# Patient Record
Sex: Male | Born: 1950 | Race: White | Hispanic: No | Marital: Single | Smoking: Never smoker
Health system: Southern US, Community
[De-identification: ages and names within clinical notes are randomized; demographics above are authoritative.]

## PROBLEM LIST (undated history)

## (undated) DIAGNOSIS — F32A Depression, unspecified: Secondary | ICD-10-CM

## (undated) DIAGNOSIS — E119 Type 2 diabetes mellitus without complications: Secondary | ICD-10-CM

## (undated) DIAGNOSIS — I10 Essential (primary) hypertension: Secondary | ICD-10-CM

## (undated) DIAGNOSIS — K219 Gastro-esophageal reflux disease without esophagitis: Secondary | ICD-10-CM

## (undated) DIAGNOSIS — E559 Vitamin D deficiency, unspecified: Secondary | ICD-10-CM

## (undated) DIAGNOSIS — F329 Major depressive disorder, single episode, unspecified: Secondary | ICD-10-CM

## (undated) DIAGNOSIS — M1990 Unspecified osteoarthritis, unspecified site: Secondary | ICD-10-CM

## (undated) DIAGNOSIS — E785 Hyperlipidemia, unspecified: Secondary | ICD-10-CM

## (undated) DIAGNOSIS — E039 Hypothyroidism, unspecified: Secondary | ICD-10-CM

## (undated) HISTORY — DX: Type 2 diabetes mellitus without complications: E11.9

## (undated) HISTORY — PX: THYROIDECTOMY, PARTIAL: SHX18

## (undated) HISTORY — DX: Depression, unspecified: F32.A

## (undated) HISTORY — DX: Gastro-esophageal reflux disease without esophagitis: K21.9

## (undated) HISTORY — DX: Unspecified osteoarthritis, unspecified site: M19.90

## (undated) HISTORY — DX: Vitamin D deficiency, unspecified: E55.9

## (undated) HISTORY — DX: Major depressive disorder, single episode, unspecified: F32.9

## (undated) HISTORY — PX: OTHER SURGICAL HISTORY: SHX169

---

## 2008-03-19 ENCOUNTER — Ambulatory Visit: Payer: Self-pay | Admitting: Chiropractor

## 2009-08-05 ENCOUNTER — Inpatient Hospital Stay: Payer: Self-pay | Admitting: Internal Medicine

## 2009-08-18 ENCOUNTER — Ambulatory Visit: Payer: Self-pay | Admitting: Urology

## 2009-09-12 ENCOUNTER — Ambulatory Visit: Payer: Self-pay | Admitting: Internal Medicine

## 2009-09-26 ENCOUNTER — Ambulatory Visit: Payer: Self-pay | Admitting: Internal Medicine

## 2009-10-03 ENCOUNTER — Ambulatory Visit: Payer: Self-pay | Admitting: Internal Medicine

## 2010-03-28 ENCOUNTER — Emergency Department: Payer: Self-pay | Admitting: Emergency Medicine

## 2010-04-01 ENCOUNTER — Inpatient Hospital Stay: Payer: Self-pay | Admitting: Internal Medicine

## 2010-04-13 ENCOUNTER — Emergency Department: Payer: Self-pay | Admitting: Emergency Medicine

## 2010-04-16 ENCOUNTER — Inpatient Hospital Stay: Payer: Self-pay | Admitting: Internal Medicine

## 2010-05-29 ENCOUNTER — Ambulatory Visit: Payer: Self-pay | Admitting: Orthopedic Surgery

## 2010-11-10 ENCOUNTER — Inpatient Hospital Stay: Payer: Self-pay | Admitting: Internal Medicine

## 2010-12-11 ENCOUNTER — Ambulatory Visit: Payer: Self-pay | Admitting: Geriatric Medicine

## 2011-11-04 ENCOUNTER — Other Ambulatory Visit: Payer: Self-pay

## 2011-11-04 LAB — CREATININE, SERUM: EGFR (African American): >60

## 2011-11-06 ENCOUNTER — Other Ambulatory Visit: Payer: Self-pay | Admitting: Geriatric Medicine

## 2011-11-06 LAB — VANCOMYCIN, TROUGH: Vancomycin, Trough: 23 ug/mL (ref 10–20)

## 2011-11-09 ENCOUNTER — Other Ambulatory Visit: Payer: Self-pay

## 2011-11-09 LAB — VANCOMYCIN, TROUGH: Vancomycin, Trough: 20 ug/mL (ref 10–20)

## 2012-04-14 ENCOUNTER — Ambulatory Visit: Payer: Self-pay | Admitting: Gastroenterology

## 2012-04-22 LAB — PATHOLOGY REPORT

## 2012-09-13 ENCOUNTER — Other Ambulatory Visit: Payer: Self-pay

## 2014-12-16 LAB — HM DIABETES EYE EXAM

## 2014-12-16 LAB — HM DIABETES FOOT EXAM: HM DIABETIC FOOT EXAM: NEGATIVE

## 2015-03-04 ENCOUNTER — Telehealth: Payer: Self-pay | Admitting: Family Medicine

## 2015-03-04 NOTE — Telephone Encounter (Signed)
[facility] faxed a request to send patient to psychiatry. Pt has not been treated at this office for any psychological disorder. Pt is also due for routine follow-up. Have left a message with Lawson FiscalLori at front desk to speak with Crystal regarding scheduling this appt.

## 2015-03-13 ENCOUNTER — Telehealth: Payer: Self-pay | Admitting: Family Medicine

## 2015-03-13 NOTE — Telephone Encounter (Signed)
Called [facility] to inform them that patient will need a follow-up appt for his Diabetes, hypothyroid, and labwork prior to getting anymore refills.

## 2015-03-18 LAB — HM DIABETES FOOT EXAM: HM Diabetic Foot Exam: NEGATIVE

## 2015-03-28 ENCOUNTER — Telehealth: Payer: Self-pay | Admitting: Family Medicine

## 2015-03-28 ENCOUNTER — Ambulatory Visit (INDEPENDENT_AMBULATORY_CARE_PROVIDER_SITE_OTHER): Payer: Medicare Other | Admitting: Family Medicine

## 2015-03-28 ENCOUNTER — Encounter: Payer: Self-pay | Admitting: Family Medicine

## 2015-03-28 ENCOUNTER — Other Ambulatory Visit
Admission: RE | Admit: 2015-03-28 | Discharge: 2015-03-28 | Disposition: A | Discharge status: Home / Self Care | Payer: Medicare Other | Source: Ambulatory Visit | Attending: Family Medicine | Admitting: Family Medicine

## 2015-03-28 VITALS — BP 120/74 | HR 69 | Temp 98.0°F | Resp 16 | Ht 67.0 in | Wt 236.4 lb

## 2015-03-28 DIAGNOSIS — E890 Postprocedural hypothyroidism: Secondary | ICD-10-CM

## 2015-03-28 DIAGNOSIS — E559 Vitamin D deficiency, unspecified: Secondary | ICD-10-CM | POA: Diagnosis not present

## 2015-03-28 DIAGNOSIS — E1142 Type 2 diabetes mellitus with diabetic polyneuropathy: Secondary | ICD-10-CM

## 2015-03-28 DIAGNOSIS — Z79899 Other long term (current) drug therapy: Secondary | ICD-10-CM | POA: Insufficient documentation

## 2015-03-28 DIAGNOSIS — E1140 Type 2 diabetes mellitus with diabetic neuropathy, unspecified: Secondary | ICD-10-CM | POA: Insufficient documentation

## 2015-03-28 DIAGNOSIS — F32A Depression, unspecified: Secondary | ICD-10-CM

## 2015-03-28 DIAGNOSIS — N189 Chronic kidney disease, unspecified: Secondary | ICD-10-CM | POA: Insufficient documentation

## 2015-03-28 DIAGNOSIS — N182 Chronic kidney disease, stage 2 (mild): Secondary | ICD-10-CM

## 2015-03-28 DIAGNOSIS — E1122 Type 2 diabetes mellitus with diabetic chronic kidney disease: Secondary | ICD-10-CM

## 2015-03-28 DIAGNOSIS — E780 Pure hypercholesterolemia: Secondary | ICD-10-CM | POA: Insufficient documentation

## 2015-03-28 DIAGNOSIS — I1310 Hypertensive heart and chronic kidney disease without heart failure, with stage 1 through stage 4 chronic kidney disease, or unspecified chronic kidney disease: Secondary | ICD-10-CM | POA: Insufficient documentation

## 2015-03-28 DIAGNOSIS — I10 Essential (primary) hypertension: Secondary | ICD-10-CM | POA: Insufficient documentation

## 2015-03-28 DIAGNOSIS — F329 Major depressive disorder, single episode, unspecified: Secondary | ICD-10-CM

## 2015-03-28 LAB — LIPID PANEL
CHOL/HDL RATIO: 3.9 ratio
CHOLESTEROL: 163 mg/dL (ref 0–200)
HDL: 42 mg/dL (ref >40)
LDL Cholesterol: 96 mg/dL (ref 0–99)
TRIGLYCERIDES: 126 mg/dL (ref <150)
VLDL: 25 mg/dL (ref 0–40)

## 2015-03-28 LAB — COMPREHENSIVE METABOLIC PANEL
ALK PHOS: 75 U/L (ref 38–126)
ALT: 16 U/L — ABNORMAL LOW (ref 17–63)
ANION GAP: 10 (ref 5–15)
AST: 23 U/L (ref 15–41)
Albumin: 3.8 g/dL (ref 3.5–5.0)
BUN: 18 mg/dL (ref 6–20)
CO2: 29 mmol/L (ref 22–32)
Calcium: 9.1 mg/dL (ref 8.9–10.3)
Chloride: 101 mmol/L (ref 101–111)
Creatinine, Ser: 1.02 mg/dL (ref 0.61–1.24)
GLUCOSE: 107 mg/dL — AB (ref 65–99)
POTASSIUM: 3.8 mmol/L (ref 3.5–5.1)
Sodium: 140 mmol/L (ref 135–145)
TOTAL PROTEIN: 7.1 g/dL (ref 6.5–8.1)
Total Bilirubin: 0.6 mg/dL (ref 0.3–1.2)

## 2015-03-28 LAB — POCT GLYCOSYLATED HEMOGLOBIN (HGB A1C): Hemoglobin A1C: 6

## 2015-03-28 LAB — TSH: TSH: 0.069 u[IU]/mL — ABNORMAL LOW (ref 0.350–4.500)

## 2015-03-28 MED ORDER — METFORMIN HCL 500 MG PO TABS: 500.0000 mg | ORAL_TABLET | Freq: Two times a day (BID) | ORAL | Status: DC

## 2015-03-28 MED ORDER — METOPROLOL TARTRATE 25 MG PO TABS: 12.5000 mg | ORAL_TABLET | Freq: Two times a day (BID) | ORAL | Status: DC

## 2015-03-28 MED ORDER — LEVOTHYROXINE SODIUM 175 MCG PO TABS: 175.0000 ug | ORAL_TABLET | Freq: Every day | ORAL | Status: DC

## 2015-03-28 MED ORDER — LISINOPRIL 10 MG PO TABS: 15.0000 mg | ORAL_TABLET | Freq: Every day | ORAL | Status: DC

## 2015-03-28 MED ORDER — PAROXETINE HCL 30 MG PO TABS: 30.0000 mg | ORAL_TABLET | Freq: Every day | ORAL | Status: DC

## 2015-03-28 MED ORDER — FUROSEMIDE 40 MG PO TABS: 40.0000 mg | ORAL_TABLET | Freq: Every day | ORAL | Status: DC

## 2015-03-28 MED ORDER — LEVOTHYROXINE SODIUM 150 MCG PO TABS: 150.0000 ug | ORAL_TABLET | Freq: Every day | ORAL | Status: DC

## 2015-03-28 MED ORDER — CHOLECALCIFEROL 50 MCG (2000 UT) PO CAPS: 1.0000 | ORAL_CAPSULE | Freq: Every day | ORAL | Status: DC

## 2015-03-28 NOTE — Progress Notes (Signed)
Subjective:    Patient ID: Clayton Grebe., male    DOB: March 20, 1951, 64 y.o.   MRN: [id number]  HPI: Clayton Tallman. is a 64 y.o. male presenting on 03/28/2015 for Diabetes   Diabetes He presents for his follow-up diabetic visit. He has type 2 diabetes mellitus. His disease course has been stable. There are no hypoglycemic associated symptoms. Associated symptoms include foot paresthesias (occasional) and weight loss (pt is trying to lose weight through exercise). Pertinent negatives for diabetes include no blurred vision, no chest pain, no polydipsia, no polyphagia, no polyuria and no visual change. Symptoms are stable. Risk factors for coronary artery disease include dyslipidemia, hypertension and male sex. Current diabetic treatment includes oral agent (monotherapy). He is following a diabetic diet. His breakfast blood glucose range is generally 90-110 mg/dl. His overall blood glucose range is 90-110 mg/dl. An ACE inhibitor/angiotensin II receptor blocker is being taken. He sees a podiatrist.Eye exam is current.  Hypertension This is a chronic problem. The current episode started more than 1 year ago. The problem is controlled. Associated symptoms include peripheral edema (trace- takes lasixc). Pertinent negatives include no blurred vision, chest pain, palpitations or shortness of breath. Past treatments include ACE inhibitors. The current treatment provides moderate improvement. Hypertensive end-organ damage includes a thyroid problem.  Thyroid Problem Presents for follow-up visit. Symptoms include weight loss (pt is trying to lose weight through exercise). Patient reports no cold intolerance, constipation, diaphoresis, diarrhea, palpitations, visual change or weight gain. The symptoms have been stable. Prior procedures include thyroidectomy. His past medical history is significant for diabetes and neuropathy.   Depression: Pt reports he is feeling well but has occasional spell or doing  poorly. Facility had requested a psychiatric evaluation 1 month ago. Staff member accompanying patient was unable to tell why. Pt reports he sometimes feels very down and can "get himself out of it" He does endorse wanting to speak with a counselor regarding his symptoms. Denies SI/HI.   Past Medical History  Diagnosis Date  . Diabetes mellitus without complication   . Arthritis   . Vitamin D deficiency   . Depression   . GERD (gastroesophageal reflux disease)   . CKD stage 3 due to type 2 diabetes mellitus     No current outpatient prescriptions on file prior to visit.   No current facility-administered medications on file prior to visit.    Review of Systems  Constitutional: Positive for weight loss (pt is trying to lose weight through exercise). Negative for weight gain and diaphoresis.  Eyes: Negative for blurred vision.  Respiratory: Negative for shortness of breath.   Cardiovascular: Negative for chest pain and palpitations.  Gastrointestinal: Negative for diarrhea and constipation.  Endocrine: Negative for cold intolerance, polydipsia, polyphagia and polyuria.   Per HPI unless specifically indicated above     Objective:    BP 120/74 mmHg  Pulse 69  Temp(Src) 98 F (36.7 C) (Oral)  Resp 16  Ht 5\' 7"  (1.702 m)  Wt 236 lb 6.4 oz (107.23 kg)  BMI 37.02 kg/m2  Wt Readings from Last 3 Encounters:  03/28/15 236 lb 6.4 oz (107.23 kg)    Physical Exam  Constitutional: He is oriented to person, place, and time. He appears well-developed and well-nourished. No distress.  Neck: Normal range of motion. Neck supple. No thyromegaly present.  Cardiovascular: Normal rate and regular rhythm.  Exam reveals no gallop and no friction rub.   No murmur heard. Pulmonary/Chest: Effort normal and breath  sounds normal.  Abdominal: Soft. Bowel sounds are normal. There is no tenderness. There is no rebound.  Musculoskeletal: Normal range of motion. He exhibits no edema or tenderness.   Lymphadenopathy:    He has no cervical adenopathy.  Neurological: He is alert and oriented to person, place, and time.  Skin: Skin is warm and dry. He is not diaphoretic.  Skin on BLE ruddy- consistent with poor venous return.   Psychiatric: He has a normal mood and affect. His speech is normal and behavior is normal. Judgment and thought content normal. Cognition and memory are normal.   Diabetic Foot Exam - Simple   Simple Foot Form  Diabetic Foot exam was performed with the following findings:  Yes 03/28/2015  9:17 AM  Visual Inspection  No deformities, no ulcerations, no other skin breakdown bilaterally:  Yes  Sensation Testing  See comments:  Yes  Pulse Check  Posterior Tibialis and Dorsalis pulse intact bilaterally:  Yes  Comments  Decreased sensation to vibration bilateral feet. Intact to monofilament.      Results for orders placed or performed in visit on 03/28/15  POCT HgB A1C  Result Value Ref Range   Hemoglobin A1C 6.0    Depression screen [facility] 2/9 03/28/2015 03/28/2015  Decreased Interest 1 1  Down, Depressed, Hopeless 1 1  PHQ - 2 Score 2 2  Altered sleeping 0 1  Tired, decreased energy 0 0  Change in appetite 0 0  Feeling bad or failure about yourself  0 0  Trouble concentrating 0 0  Moving slowly or fidgety/restless 0 0  Suicidal thoughts 0 0  PHQ-9 Score 2 3  Difficult doing work/chores Not difficult at all -        Assessment & Plan:   Problem List Items Addressed This Visit      Endocrine   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 A1c down to 6.0%. Pt endorses increased exercise and weight loss to help control diabetes. Peripheral neuropathy symptoms are occasional.  Consider reducing metformin to 500mg  once daily if A1c drops below 6.0% Eye exam: Due 06/2015 Foot exam: Done Podiatry does foot care for patient at his nursing facility.  Lipid panel, CMET, and microalbumin ordered  today.        Relevant Medications   aspirin 81 MG tablet   lisinopril (PRINIVIL,ZESTRIL) 10 MG tablet   metFORMIN (GLUCOPHAGE) 500 MG tablet   Post-operative hypothyroidism    Recheck TSH today to determine dose titration.  Last TSH was high and dose increased, however pt stable on for years.  Consider reducing dose pending TSH.       Relevant Medications   levothyroxine (SYNTHROID, LEVOTHROID) 175 MCG tablet   metoprolol tartrate (LOPRESSOR) 25 MG tablet   Other Relevant Orders   TSH     Other   Vitamin D deficiency    Recheck Vitamin D to determine if therapy is effective. Continue 2000IU vitamin D daily.       Relevant Orders   Vit D  25 hydroxy (rtn osteoporosis monitoring)   Moderate depressive disorder    PHQ-9 scores indicated controlled depression on paxil, however pt does reports having "spells" where he feels very down. Facility requested psych evaluation. Pt would like to seek counseling.  Self referral information to [facility] care given to patient and facility to seek counseling.  Consider paxil dose adjustment.      Relevant Medications  traZODone (DESYREL) 50 MG tablet   PARoxetine (PAXIL) 30 MG tablet      Meds ordered this encounter  Medications  . DISCONTD: furosemide (LASIX) 40 MG tablet    Sig:   . DISCONTD: levothyroxine (SYNTHROID, LEVOTHROID) 175 MCG tablet    Sig:   . DISCONTD: lisinopril (PRINIVIL,ZESTRIL) 10 MG tablet    Sig:   . DISCONTD: metFORMIN (GLUCOPHAGE) 500 MG tablet    Sig:   . DISCONTD: metoprolol tartrate (LOPRESSOR) 25 MG tablet    Sig:   . nystatin cream (MYCOSTATIN)    Sig:   . DISCONTD: PARoxetine (PAXIL) 30 MG tablet    Sig:   . traZODone (DESYREL) 50 MG tablet    Sig:   . aspirin 81 MG tablet    Sig: Take 81 mg by mouth daily.  Marland Kitchen DISCONTD: Cholecalciferol 2000 UNITS CAPS    Sig: Take 1 capsule by mouth daily.  Marland Kitchen lisinopril (PRINIVIL,ZESTRIL) 10 MG tablet    Sig: Take 1.5 tablets (15 mg  total) by mouth daily.    Dispense:  45 tablet    Refill:  11    Order Specific Question:  Supervising Provider    Answer:  Janeann Forehand [phone]  . levothyroxine (SYNTHROID, LEVOTHROID) 175 MCG tablet    Sig: Take 1 tablet (175 mcg total) by mouth daily before breakfast.    Dispense:  30 tablet    Refill:  11    Order Specific Question:  Supervising Provider    Answer:  Janeann Forehand [phone]  . furosemide (LASIX) 40 MG tablet    Sig: Take 1 tablet (40 mg total) by mouth daily.    Dispense:  30 tablet    Refill:  11    Order Specific Question:  Supervising Provider    Answer:  Janeann Forehand [phone]  . Cholecalciferol 2000 UNITS CAPS    Sig: Take 1 capsule (2,000 Units total) by mouth daily.    Dispense:  30 each    Refill:  11    Order Specific Question:  Supervising Provider    Answer:  Janeann Forehand [phone]  . PARoxetine (PAXIL) 30 MG tablet    Sig: Take 1 tablet (30 mg total) by mouth daily.    Dispense:  30 tablet    Refill:  11    Order Specific Question:  Supervising Provider    Answer:  Janeann Forehand [phone]  . metoprolol tartrate (LOPRESSOR) 25 MG tablet    Sig: Take 0.5 tablets (12.5 mg total) by mouth 2 (two) times daily.    Dispense:  30 tablet    Refill:  11    Order Specific Question:  Supervising Provider    Answer:  Janeann Forehand [phone]  . metFORMIN (GLUCOPHAGE) 500 MG tablet    Sig: Take 1 tablet (500 mg total) by mouth 2 (two) times daily with a meal.    Dispense:  60 tablet    Refill:  11    Order Specific Question:  Supervising Provider    Answer:  Janeann Forehand [phone]      Follow up plan: Return in about 3 months (around 06/28/2015).

## 2015-03-28 NOTE — Patient Instructions (Signed)
Great job with your exercise!  Your diabetes is well controlled.  We will send you to [facility] to seek counseling. You can walk in Monday- Friday 9am-4pm. Their number is [phone].

## 2015-03-28 NOTE — Assessment & Plan Note (Signed)
Recheck Vitamin D to determine if therapy is effective. Continue 2000IU vitamin D daily.

## 2015-03-28 NOTE — Assessment & Plan Note (Signed)
Check CMET today to determine kidney function.

## 2015-03-28 NOTE — Telephone Encounter (Signed)
Morrie Sheldon called for clarification on pt vitamin D order. Per [facility] he was getting both 50000 units and 2000IU vitamin D. 500000 IU was discontinued in Feb 2016. Per [facility] they never received this order from [facility]. This has been a consistent problem. Discussed strategies to prevent this from happening again. Ashley's recommendation was to fax them directly at [phone].   Called and gave a verbal order to D/C levothyroxine daily and restart based on recent TSH 68f 0.069. Recheck dosing in 12 weeks.   Attempted to call and inform caregiver verbally but was unable to do so. Will fax order to main number.

## 2015-03-28 NOTE — Assessment & Plan Note (Addendum)
Controlled. A1c down to 6.0%. Pt endorses increased exercise and weight loss to help control diabetes. Peripheral neuropathy symptoms are occasional.  Consider reducing metformin to  once daily if A1c drops below 6.0% Eye exam: Due 06/2015 Foot exam: Done Podiatry does foot care for patient at his nursing facility.  Lipid panel, CMET, and microalbumin ordered today.  RTC 3 mos.

## 2015-03-28 NOTE — Assessment & Plan Note (Signed)
Recheck TSH today to determine dose titration.  Last TSH was high and dose increased, however pt stable on for years.  Consider reducing dose pending TSH.

## 2015-03-28 NOTE — Assessment & Plan Note (Signed)
PHQ-9 scores indicated controlled depression on paxil, however pt does reports having "spells" where he feels very down. Facility requested psych evaluation. Pt would like to seek counseling.  Self referral information to [facility] care given to patient and facility to seek counseling.  Consider paxil dose adjustment.

## 2015-03-29 LAB — VITAMIN D 25 HYDROXY (VIT D DEFICIENCY, FRACTURES): Vit D, 25-Hydroxy: 33.2 ng/mL (ref 30.0–100.0)

## 2015-03-29 LAB — MICROALBUMIN, URINE: Microalb, Ur: 3.3 ug/mL — ABNORMAL HIGH

## 2015-05-13 ENCOUNTER — Other Ambulatory Visit: Payer: Self-pay | Admitting: Family Medicine

## 2015-05-14 MED ORDER — AQUAPHOR EX OINT: TOPICAL_OINTMENT | CUTANEOUS | Status: AC | PRN

## 2015-05-14 NOTE — Telephone Encounter (Signed)
Done.[place] 

## 2015-07-04 ENCOUNTER — Ambulatory Visit: Payer: Medicaid Other | Admitting: Family Medicine

## 2015-07-15 ENCOUNTER — Ambulatory Visit: Payer: Medicare Other | Admitting: Family Medicine

## 2015-07-23 ENCOUNTER — Encounter: Payer: Self-pay | Admitting: Family Medicine

## 2015-07-23 ENCOUNTER — Ambulatory Visit (INDEPENDENT_AMBULATORY_CARE_PROVIDER_SITE_OTHER): Payer: Medicare Other | Admitting: Family Medicine

## 2015-07-23 VITALS — BP 117/77 | HR 60 | Temp 98.7°F | Resp 16 | Ht 67.5 in | Wt 234.2 lb

## 2015-07-23 DIAGNOSIS — M7989 Other specified soft tissue disorders: Secondary | ICD-10-CM | POA: Diagnosis not present

## 2015-07-23 DIAGNOSIS — F329 Major depressive disorder, single episode, unspecified: Secondary | ICD-10-CM

## 2015-07-23 DIAGNOSIS — E559 Vitamin D deficiency, unspecified: Secondary | ICD-10-CM

## 2015-07-23 DIAGNOSIS — E890 Postprocedural hypothyroidism: Secondary | ICD-10-CM

## 2015-07-23 DIAGNOSIS — E1142 Type 2 diabetes mellitus with diabetic polyneuropathy: Secondary | ICD-10-CM | POA: Diagnosis not present

## 2015-07-23 DIAGNOSIS — L0212 Furuncle of neck: Secondary | ICD-10-CM | POA: Diagnosis not present

## 2015-07-23 DIAGNOSIS — F32A Depression, unspecified: Secondary | ICD-10-CM

## 2015-07-23 DIAGNOSIS — N182 Chronic kidney disease, stage 2 (mild): Secondary | ICD-10-CM

## 2015-07-23 DIAGNOSIS — I129 Hypertensive chronic kidney disease with stage 1 through stage 4 chronic kidney disease, or unspecified chronic kidney disease: Secondary | ICD-10-CM

## 2015-07-23 LAB — POCT GLYCOSYLATED HEMOGLOBIN (HGB A1C): Hemoglobin A1C: 5.8

## 2015-07-23 MED ORDER — DOXYCYCLINE HYCLATE 100 MG PO TABS: 100.0000 mg | ORAL_TABLET | Freq: Two times a day (BID) | ORAL | Status: DC

## 2015-07-23 MED ORDER — FUROSEMIDE 20 MG PO TABS: 20.0000 mg | ORAL_TABLET | Freq: Every day | ORAL | Status: DC

## 2015-07-23 NOTE — Assessment & Plan Note (Signed)
Overall doing well. Managed at [facility].

## 2015-07-23 NOTE — Assessment & Plan Note (Signed)
Check TSH to ensure accurate dosing of levothyroxine. Recent fluctuations in dose needs. Currently . Continue pending labs.  RTC 3 mos.

## 2015-07-23 NOTE — Patient Instructions (Signed)

## 2015-07-23 NOTE — Assessment & Plan Note (Signed)
Continue 2000IU vitamin D daily to maintain normal vitamin D levels. Recheck Vitamin D levels next visit.

## 2015-07-23 NOTE — Progress Notes (Signed)
Subjective:    Patient ID: Clayton Green., male    DOB: Mar 09, 1951, 64 y.o.   MRN: [id number]  HPI: Clayton Green. is a 64 y.o. male presenting on 07/23/2015 for Diabetes   Diabetes He presents for his follow-up diabetic visit. He has type 2 diabetes mellitus. There are no hypoglycemic associated symptoms. Pertinent negatives for hypoglycemia include no headaches. Pertinent negatives for diabetes include no chest pain, no foot paresthesias, no polydipsia, no polyphagia, no polyuria and no weakness. There are no hypoglycemic complications. Risk factors for coronary artery disease include dyslipidemia, hypertension, male sex and obesity. His weight is stable. He is following a diabetic and generally healthy diet. His overall blood glucose range is 110-130 mg/dl.  Hyperlipidemia Pertinent negatives include no chest pain or shortness of breath.  Hypertension This is a chronic problem. Pertinent negatives include no chest pain, headaches, peripheral edema or shortness of breath. Risk factors for coronary artery disease include dyslipidemia, diabetes mellitus and male gender. Past treatments include ACE inhibitors.    Pt presents for diabetes.  Avg sugars are 120-130.  No numbness and tingling in feet- improved from last visit.  No low blood sugars.  Pt has been walking with PT  BP: Controlled in facility. Denies CP, SOB, visual changes.  Thyroid disease: S/p thyroidectomy. Dose has changed several times in past few years. Denies cold intolerance, skin changes, bowel changes.  Boil: On neck present for a few days. No pustule. No home care. Tender to palpation. Declines I and D today.  Leg swelling: Much improved. Pt is requesting to reduce dose of lasix today.   Past Medical History  Diagnosis Date  . Diabetes mellitus without complication   . Arthritis   . Vitamin D deficiency   . Depression   . GERD (gastroesophageal reflux disease)   . CKD stage 3 due to type 2 diabetes mellitus      Current Outpatient Prescriptions on File Prior to Visit  Medication Sig  . aspirin 81 MG tablet Take 81 mg by mouth daily.  . Cholecalciferol 2000 UNITS CAPS Take 1 capsule (2,000 Units total) by mouth daily.  Marland Kitchen levothyroxine (SYNTHROID, LEVOTHROID) 150 MCG tablet Take 1 tablet (150 mcg total) by mouth daily before breakfast.  . lisinopril (PRINIVIL,ZESTRIL) 10 MG tablet Take 1.5 tablets (15 mg total) by mouth daily.  . metFORMIN (GLUCOPHAGE) 500 MG tablet Take 1 tablet (500 mg total) by mouth 2 (two) times daily with a meal.  . metoprolol tartrate (LOPRESSOR) 25 MG tablet Take 0.5 tablets (12.5 mg total) by mouth 2 (two) times daily.  . mineral oil-hydrophilic petrolatum (AQUAPHOR) ointment Apply topically as needed for dry skin. Apply to legs daily.  Marland Kitchen nystatin cream (MYCOSTATIN) APPLY TOPICALLY 2 TIMES A DAY  . PARoxetine (PAXIL) 30 MG tablet Take 1 tablet (30 mg total) by mouth daily.  . traZODone (DESYREL) 50 MG tablet    No current facility-administered medications on file prior to visit.    Review of Systems  Constitutional: Negative for fever and chills.  Respiratory: Negative for chest tightness, shortness of breath and wheezing.   Cardiovascular: Negative for chest pain.  Gastrointestinal: Negative.   Endocrine: Negative for cold intolerance, heat intolerance, polydipsia, polyphagia and polyuria.  Skin: Positive for rash.  Neurological: Negative for weakness, light-headedness, numbness and headaches.  Psychiatric/Behavioral: Negative.    Per HPI unless specifically indicated above     Objective:    BP 117/77 mmHg  Pulse 60  Temp(Src) 98.7  F (37.1 C) (Oral)  Resp 16  Ht 5' 7.5" (1.715 m)  Wt 234 lb 3.2 oz (106.232 kg)  BMI 36.12 kg/m2  Wt Readings from Last 3 Encounters:  07/23/15 234 lb 3.2 oz (106.232 kg)  03/28/15 236 lb 6.4 oz (107.23 kg)    Physical Exam  Constitutional: He is oriented to person, place, and time. He appears well-developed and  well-nourished. No distress.  Neck: Normal range of motion. Neck supple. No thyromegaly present.  Cardiovascular: Normal rate and regular rhythm.  Exam reveals no gallop and no friction rub.   No murmur heard. Pulmonary/Chest: Effort normal and breath sounds normal.  Abdominal: Soft. Bowel sounds are normal. There is no tenderness. There is no rebound.  Musculoskeletal: Normal range of motion. He exhibits no edema or tenderness.  Lymphadenopathy:    He has no cervical adenopathy.  Neurological: He is alert and oriented to person, place, and time.  Skin: Skin is warm and dry. He is not diaphoretic. No pallor.  1cm in diameter furuncle on L anterior neck. Erythematous, no pustule.   Ruddy skin on BLE due to vascular congestion.    Results for orders placed or performed in visit on 07/23/15  POCT HgB A1C  Result Value Ref Range   Hemoglobin A1C 5.8   HM DIABETES FOOT EXAM  Result Value Ref Range   HM Diabetic Foot Exam negative   HM DIABETES EYE EXAM  Result Value Ref Range   HM Diabetic Eye Exam No Retinopathy No Retinopathy  HM DIABETES FOOT EXAM  Result Value Ref Range   HM Diabetic Foot Exam negative       Assessment & Plan:   Problem List Items Addressed This Visit      Cardiovascular and Mediastinum   Benign hypertension with CKD (chronic kidney disease), stage II    BP controlled in office today. Continue ACE for renal protection and microalbuminuria.  DASH diet reviewed. Alarm symptoms reviewed. BMP. RTC 3 mos.       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 A1c is 5.8%.  Pt would like to continue metformin BID. Consider reducing dose at next visit.  Neuropathy improved with glycemic control. Foot exam UTD. Eye exam UTD Microalbumin positive, ACE for renal protection. RTC 3 mos.       Relevant Orders   POCT HgB  A1C (Completed)   Basic Metabolic Panel (BMET)   Post-operative hypothyroidism    Check TSH to ensure accurate dosing of levothyroxine. Recent fluctuations in dose needs. Currently 150mcg. Continue pending labs.  RTC 3 mos.       Relevant Orders   TSH     Other   Vitamin D deficiency    Continue 2000IU vitamin D daily to maintain normal vitamin D levels. Recheck Vitamin D levels next visit.       Moderate depressive disorder    Other Visit Diagnoses    Furuncle of neck        Wamr compresses TID and doxycycline. RTC if not improved.     Relevant Medications    doxycycline (VIBRA-TABS) 100 MG tablet    Leg swelling        Reduce ladix to 20mg  daily since swelling is much improved.     Relevant Medications    furosemide (LASIX) 20 MG tablet  Meds ordered this encounter  Medications  . doxycycline (VIBRA-TABS) 100 MG tablet    Sig: Take 1 tablet (100 mg total) by mouth 2 (two) times daily.    Dispense:  14 tablet    Refill:  0    Order Specific Question:  Supervising Provider    Answer:  Janeann Forehand [phone]  . furosemide (LASIX) 20 MG tablet    Sig: Take 1 tablet (20 mg total) by mouth daily.    Dispense:  30 tablet    Refill:  11    Order Specific Question:  Supervising Provider    Answer:  Janeann Forehand [phone]      Follow up plan: Return in about 3 months (around 10/21/2015) for TSH, Diabetes.

## 2015-07-23 NOTE — Assessment & Plan Note (Signed)
BP controlled in office today. Continue ACE for renal protection and microalbuminuria.  DASH diet reviewed. Alarm symptoms reviewed. BMP. RTC 3 mos.

## 2015-07-23 NOTE — Assessment & Plan Note (Signed)
Very well controlled. A1c is 5.8%.  Pt would like to continue metformin BID. Consider reducing dose at next visit.  Neuropathy improved with glycemic control. Foot exam UTD. Eye exam UTD Microalbumin positive, ACE for renal protection. RTC 3 mos.

## 2015-08-06 ENCOUNTER — Other Ambulatory Visit
Admission: RE | Admit: 2015-08-06 | Discharge: 2015-08-06 | Disposition: A | Discharge status: Home / Self Care | Payer: Medicare Other | Source: Ambulatory Visit | Attending: Family Medicine | Admitting: Family Medicine

## 2015-08-06 DIAGNOSIS — I10 Essential (primary) hypertension: Secondary | ICD-10-CM | POA: Insufficient documentation

## 2015-08-06 DIAGNOSIS — E039 Hypothyroidism, unspecified: Secondary | ICD-10-CM | POA: Diagnosis present

## 2015-08-06 LAB — BASIC METABOLIC PANEL
Anion gap: 4 — ABNORMAL LOW (ref 5–15)
BUN: 20 mg/dL (ref 6–20)
CHLORIDE: 104 mmol/L (ref 101–111)
CO2: 32 mmol/L (ref 22–32)
Calcium: 8.9 mg/dL (ref 8.9–10.3)
Creatinine, Ser: 1.12 mg/dL (ref 0.61–1.24)
GFR calc non Af Amer: >60 mL/min (ref >60)
Glucose, Bld: 110 mg/dL — ABNORMAL HIGH (ref 65–99)
POTASSIUM: 4.1 mmol/L (ref 3.5–5.1)
SODIUM: 140 mmol/L (ref 135–145)

## 2015-08-06 LAB — TSH: TSH: 1.481 u[IU]/mL (ref 0.350–4.500)

## 2015-10-25 ENCOUNTER — Telehealth: Payer: Self-pay | Admitting: Family Medicine

## 2015-10-25 NOTE — Telephone Encounter (Signed)
Noted! Thank you

## 2015-10-25 NOTE — Telephone Encounter (Signed)
[name] is now seeing on site doctors so his appt for Tuesday next week was cancelled.

## 2015-10-29 ENCOUNTER — Ambulatory Visit: Payer: Medicare Other | Admitting: Family Medicine

## 2016-06-21 ENCOUNTER — Emergency Department: Payer: Medicare Other

## 2016-06-21 ENCOUNTER — Inpatient Hospital Stay
Admission: EM | Admit: 2016-06-21 | Discharge: 2016-06-23 | DRG: 445 | Disposition: A | Discharge status: Nursing Home (Includes ICF) | Payer: Medicare Other | Attending: Internal Medicine | Admitting: Internal Medicine

## 2016-06-21 DIAGNOSIS — Z79899 Other long term (current) drug therapy: Secondary | ICD-10-CM

## 2016-06-21 DIAGNOSIS — K219 Gastro-esophageal reflux disease without esophagitis: Secondary | ICD-10-CM | POA: Diagnosis present

## 2016-06-21 DIAGNOSIS — F329 Major depressive disorder, single episode, unspecified: Secondary | ICD-10-CM | POA: Diagnosis present

## 2016-06-21 DIAGNOSIS — E1140 Type 2 diabetes mellitus with diabetic neuropathy, unspecified: Secondary | ICD-10-CM | POA: Diagnosis present

## 2016-06-21 DIAGNOSIS — K8020 Calculus of gallbladder without cholecystitis without obstruction: Secondary | ICD-10-CM | POA: Diagnosis present

## 2016-06-21 DIAGNOSIS — N183 Chronic kidney disease, stage 3 (moderate): Secondary | ICD-10-CM | POA: Diagnosis present

## 2016-06-21 DIAGNOSIS — E1142 Type 2 diabetes mellitus with diabetic polyneuropathy: Secondary | ICD-10-CM

## 2016-06-21 DIAGNOSIS — K8050 Calculus of bile duct without cholangitis or cholecystitis without obstruction: Secondary | ICD-10-CM | POA: Diagnosis present

## 2016-06-21 DIAGNOSIS — E1122 Type 2 diabetes mellitus with diabetic chronic kidney disease: Secondary | ICD-10-CM | POA: Diagnosis present

## 2016-06-21 DIAGNOSIS — Z7982 Long term (current) use of aspirin: Secondary | ICD-10-CM | POA: Diagnosis not present

## 2016-06-21 DIAGNOSIS — I129 Hypertensive chronic kidney disease with stage 1 through stage 4 chronic kidney disease, or unspecified chronic kidney disease: Secondary | ICD-10-CM | POA: Diagnosis not present

## 2016-06-21 DIAGNOSIS — E890 Postprocedural hypothyroidism: Secondary | ICD-10-CM | POA: Diagnosis present

## 2016-06-21 DIAGNOSIS — Z794 Long term (current) use of insulin: Secondary | ICD-10-CM

## 2016-06-21 DIAGNOSIS — Z6841 Body Mass Index (BMI) 40.0 and over, adult: Secondary | ICD-10-CM

## 2016-06-21 DIAGNOSIS — Z91013 Allergy to seafood: Secondary | ICD-10-CM

## 2016-06-21 DIAGNOSIS — Z833 Family history of diabetes mellitus: Secondary | ICD-10-CM

## 2016-06-21 DIAGNOSIS — K8070 Calculus of gallbladder and bile duct without cholecystitis without obstruction: Secondary | ICD-10-CM | POA: Diagnosis not present

## 2016-06-21 LAB — TROPONIN I: Troponin I: <0.03 ng/mL (ref <0.03)

## 2016-06-21 LAB — URINALYSIS COMPLETE WITH MICROSCOPIC (ARMC ONLY)
Bacteria, UA: NONE SEEN
Bilirubin Urine: NEGATIVE
Glucose, UA: NEGATIVE mg/dL
Hgb urine dipstick: NEGATIVE
Leukocytes, UA: NEGATIVE
NITRITE: NEGATIVE
PH: 6 (ref 5.0–8.0)
PROTEIN: 30 mg/dL — AB
SPECIFIC GRAVITY, URINE: 1.014 (ref 1.005–1.030)

## 2016-06-21 LAB — COMPREHENSIVE METABOLIC PANEL
ALBUMIN: 3.9 g/dL (ref 3.5–5.0)
ALT: 185 U/L — ABNORMAL HIGH (ref 17–63)
ANION GAP: 10 (ref 5–15)
AST: 67 U/L — ABNORMAL HIGH (ref 15–41)
Alkaline Phosphatase: 196 U/L — ABNORMAL HIGH (ref 38–126)
BUN: 14 mg/dL (ref 6–20)
CHLORIDE: 100 mmol/L — AB (ref 101–111)
CO2: 27 mmol/L (ref 22–32)
Calcium: 9 mg/dL (ref 8.9–10.3)
Creatinine, Ser: 1.03 mg/dL (ref 0.61–1.24)
GFR calc Af Amer: >60 mL/min (ref >60)
GFR calc non Af Amer: >60 mL/min (ref >60)
GLUCOSE: 137 mg/dL — AB (ref 65–99)
POTASSIUM: 3.9 mmol/L (ref 3.5–5.1)
SODIUM: 137 mmol/L (ref 135–145)
Total Bilirubin: 1.4 mg/dL — ABNORMAL HIGH (ref 0.3–1.2)
Total Protein: 7.5 g/dL (ref 6.5–8.1)

## 2016-06-21 LAB — CBC
HCT: 44 % (ref 40.0–52.0)
HEMOGLOBIN: 15.4 g/dL (ref 13.0–18.0)
MCH: 31.9 pg (ref 26.0–34.0)
MCHC: 34.9 g/dL (ref 32.0–36.0)
MCV: 91.4 fL (ref 80.0–100.0)
Platelets: 217 10*3/uL (ref 150–440)
RBC: 4.82 MIL/uL (ref 4.40–5.90)
RDW: 14.2 % (ref 11.5–14.5)
WBC: 8.1 10*3/uL (ref 3.8–10.6)

## 2016-06-21 LAB — GLUCOSE, CAPILLARY
Glucose-Capillary: 119 mg/dL — ABNORMAL HIGH (ref 65–99)
Glucose-Capillary: 111 mg/dL — ABNORMAL HIGH (ref 65–99)

## 2016-06-21 LAB — LIPASE, BLOOD: Lipase: 26 U/L (ref 11–51)

## 2016-06-21 MED ORDER — INSULIN ASPART 100 UNIT/ML ~~LOC~~ SOLN
0.0000 [IU] | Freq: Three times a day (TID) | SUBCUTANEOUS | Status: DC
  Administered 2016-06-22 – 2016-06-23 (×2): 1 [IU] via SUBCUTANEOUS
  Filled 2016-06-21: qty 1

## 2016-06-21 MED ORDER — PAROXETINE HCL 20 MG PO TABS
30.0000 mg | ORAL_TABLET | Freq: Every day | ORAL | Status: DC
  Administered 2016-06-22 – 2016-06-23 (×2): 30 mg via ORAL
  Filled 2016-06-21 (×2): qty 1

## 2016-06-21 MED ORDER — ONDANSETRON HCL 4 MG/2ML IJ SOLN
4.0000 mg | Freq: Once | INTRAMUSCULAR | Status: AC
  Administered 2016-06-21: 4 mg via INTRAVENOUS
  Filled 2016-06-21: qty 2

## 2016-06-21 MED ORDER — MORPHINE SULFATE (PF) 2 MG/ML IV SOLN
4.0000 mg | INTRAVENOUS | Status: DC | PRN
  Administered 2016-06-21: 4 mg via INTRAVENOUS
  Filled 2016-06-21: qty 2

## 2016-06-21 MED ORDER — HYDROCODONE-ACETAMINOPHEN 5-325 MG PO TABS
1.0000 | ORAL_TABLET | ORAL | Status: DC | PRN
Start: 2016-06-21 — End: 2016-06-23
  Administered 2016-06-21 – 2016-06-22 (×2): 2 via ORAL
  Administered 2016-06-22: 1 via ORAL
  Administered 2016-06-23 (×2): 2 via ORAL
  Filled 2016-06-21: qty 1
  Filled 2016-06-21 (×4): qty 2

## 2016-06-21 MED ORDER — DEXTROSE 5 % IV SOLN: 2.0000 g | INTRAVENOUS | Status: DC

## 2016-06-21 MED ORDER — ONDANSETRON HCL 4 MG/2ML IJ SOLN
4.0000 mg | Freq: Four times a day (QID) | INTRAMUSCULAR | Status: DC | PRN
  Administered 2016-06-21: 4 mg via INTRAVENOUS
  Filled 2016-06-21: qty 2

## 2016-06-21 MED ORDER — LISINOPRIL 10 MG PO TABS
15.0000 mg | ORAL_TABLET | Freq: Every day | ORAL | Status: DC
  Administered 2016-06-22 – 2016-06-23 (×2): 15 mg via ORAL
  Filled 2016-06-21 (×2): qty 1

## 2016-06-21 MED ORDER — ENOXAPARIN SODIUM 40 MG/0.4ML ~~LOC~~ SOLN
40.0000 mg | SUBCUTANEOUS | Status: DC
  Administered 2016-06-21 – 2016-06-22 (×2): 40 mg via SUBCUTANEOUS
  Filled 2016-06-21 (×2): qty 0.4

## 2016-06-21 MED ORDER — ASPIRIN EC 81 MG PO TBEC
81.0000 mg | DELAYED_RELEASE_TABLET | Freq: Every day | ORAL | Status: DC
  Administered 2016-06-22 – 2016-06-23 (×2): 81 mg via ORAL
  Filled 2016-06-21 (×2): qty 1

## 2016-06-21 MED ORDER — ACETAMINOPHEN 325 MG PO TABS
650.0000 mg | ORAL_TABLET | Freq: Four times a day (QID) | ORAL | Status: DC | PRN
  Administered 2016-06-22 (×2): 650 mg via ORAL
  Filled 2016-06-21 (×2): qty 2

## 2016-06-21 MED ORDER — FUROSEMIDE 20 MG PO TABS
20.0000 mg | ORAL_TABLET | Freq: Every day | ORAL | Status: DC
  Administered 2016-06-22: 20 mg via ORAL
  Filled 2016-06-21 (×2): qty 1

## 2016-06-21 MED ORDER — TRAZODONE HCL 50 MG PO TABS
50.0000 mg | ORAL_TABLET | Freq: Every day | ORAL | Status: DC
  Administered 2016-06-21 – 2016-06-22 (×2): 50 mg via ORAL
  Filled 2016-06-21 (×2): qty 1

## 2016-06-21 MED ORDER — ACETAMINOPHEN 650 MG RE SUPP: 650.0000 mg | Freq: Four times a day (QID) | RECTAL | Status: DC | PRN

## 2016-06-21 MED ORDER — POTASSIUM CHLORIDE IN NACL 20-0.45 MEQ/L-% IV SOLN
INTRAVENOUS | Status: DC
  Administered 2016-06-21 – 2016-06-23 (×4): via INTRAVENOUS
  Filled 2016-06-21 (×5): qty 1000

## 2016-06-21 MED ORDER — ORAL CARE MOUTH RINSE
15.0000 mL | Freq: Two times a day (BID) | OROMUCOSAL | Status: DC
  Administered 2016-06-21 – 2016-06-23 (×4): 15 mL via OROMUCOSAL

## 2016-06-21 MED ORDER — ONDANSETRON 4 MG PO TBDP: 4.0000 mg | ORAL_TABLET | Freq: Four times a day (QID) | ORAL | Status: DC | PRN

## 2016-06-21 MED ORDER — LEVOTHYROXINE SODIUM 75 MCG PO TABS
150.0000 ug | ORAL_TABLET | Freq: Every day | ORAL | Status: DC
  Administered 2016-06-23: 150 ug via ORAL
  Filled 2016-06-21: qty 2

## 2016-06-21 MED ORDER — CEFTRIAXONE SODIUM-DEXTROSE 2-2.22 GM-% IV SOLR
2.0000 g | INTRAVENOUS | Status: DC
  Administered 2016-06-21 – 2016-06-22 (×2): 2 g via INTRAVENOUS
  Filled 2016-06-21 (×3): qty 50

## 2016-06-21 MED ORDER — METFORMIN HCL 500 MG PO TABS: 500.0000 mg | ORAL_TABLET | Freq: Two times a day (BID) | ORAL | Status: DC

## 2016-06-21 MED ORDER — SODIUM CHLORIDE 0.9 % IV BOLUS (SEPSIS)
1000.0000 mL | Freq: Once | INTRAVENOUS | Status: AC
  Administered 2016-06-21: 1000 mL via INTRAVENOUS

## 2016-06-21 NOTE — Consult Note (Signed)
[facility]  Medical Consultation  Dorene Grebe. WUJ:[id number] DOB: [birth year] DOA: 06/21/2016 PCP: Filbert Berthold, NP   Requesting physician:ELY Harvie Heck Date of consultation:  06/21/16 Reason for consultation: Opinion regarding patient's chronic kidney disease, diabetes, reflux, depression  CHIEF COMPLAINT:   Chief Complaint  Patient presents with  . Nausea  . Emesis    HISTORY OF PRESENT ILLNESS: Clayton Green  is a 65 y.o. male with a known history of Osteoarthritis, chronic kidney disease stage III, depression, diabetes, GERD who is admitted by surgery for made epigastric and right upper quadrant abdominal pain. Patient reports that he's had this pain for the past few days which has gotten worse. He also has had emesis. Patient had a ultrasound which was limited but did show gallstones. There is concern for biliary process causing his symptoms therefore he is being admitted by surgery. Patient denies any chest pain or shortness of breath  PAST MEDICAL HISTORY:   Past Medical History:  Diagnosis Date  . Arthritis   . CKD stage 3 due to type 2 diabetes mellitus (HCC)   . Depression   . Diabetes mellitus without complication (HCC)   . GERD (gastroesophageal reflux disease)   . Vitamin D deficiency     PAST SURGICAL HISTORY:  Past Surgical History:  Procedure Laterality Date  . THYROIDECTOMY, PARTIAL      SOCIAL HISTORY:  Social History  Substance Use Topics  . Smoking status: Never Smoker  . Smokeless tobacco: Never Used  . Alcohol use No    FAMILY HISTORY:  Family History  Problem Relation Age of Onset  . Diabetes Mother   . Cancer Father     DRUG ALLERGIES:  Allergies  Allergen Reactions  . Shellfish Allergy     REVIEW OF SYSTEMS:   CONSTITUTIONAL: No fever, fatigue or weakness.  EYES: No blurred or double vision.  EARS, NOSE, AND THROAT: No tinnitus or ear pain.  RESPIRATORY: No cough, shortness of breath, wheezing or hemoptysis.  CARDIOVASCULAR:  No chest pain, orthopnea, edema.  GASTROINTESTINAL: No nausea, vomiting, diarrhea orRight upper quadrant abdominal abdominal pain.  GENITOURINARY: No dysuria, hematuria.  ENDOCRINE: No polyuria, nocturia,  HEMATOLOGY: No anemia, easy bruising or bleeding SKIN: No rash or lesion. MUSCULOSKELETAL: No joint pain or arthritis.   NEUROLOGIC: No tingling, numbness, weakness.  PSYCHIATRY: No anxiety or depression.   MEDICATIONS AT HOME:  Prior to Admission medications   Medication Sig Start Date End Date Taking? Authorizing Provider  acetaminophen (TYLENOL) 500 MG tablet Take 500 mg by mouth 2 (two) times daily as needed.   Yes Historical Provider, MD  aspirin 81 MG tablet Take 81 mg by mouth daily.   Yes Historical Provider, MD  Cholecalciferol 2000 UNITS CAPS Take 1 capsule (2,000 Units total) by mouth daily. 03/28/15  Yes Amy Rusty Aus, NP  furosemide (LASIX) 20 MG tablet Take 1 tablet (20 mg total) by mouth daily. Patient taking differently: Take 20 mg by mouth every other day.  07/23/15  Yes Amy Rusty Aus, NP  guaiFENesin-dextromethorphan (ROBITUSSIN DM) 100-10 MG/5ML syrup Take 15 mLs by mouth 3 (three) times daily as needed for cough.   Yes Historical Provider, MD  levothyroxine (SYNTHROID, LEVOTHROID) 150 MCG tablet Take 1 tablet (150 mcg total) by mouth daily before breakfast. 03/28/15  Yes Amy Rusty Aus, NP  lisinopril (PRINIVIL,ZESTRIL) 10 MG tablet Take 1.5 tablets (15 mg total) by mouth daily. 03/28/15  Yes Amy Rusty Aus, NP  metFORMIN (GLUCOPHAGE) 500 MG tablet Take  1 tablet (500 mg total) by mouth 2 (two) times daily with a meal. Patient taking differently: Take 500 mg by mouth daily.  03/28/15  Yes Amy Rusty AusLauren Krebs, NP  mineral oil-hydrophilic petrolatum (AQUAPHOR) ointment Apply topically as needed for dry skin. Apply to legs daily. 05/14/15  Yes Janeann ForehandJames H Hawkins Jr., MD  ondansetron (ZOFRAN) 4 MG tablet Take 4 mg by mouth every 6 (six) hours as needed for nausea or vomiting.    Yes Historical Provider, MD  PARoxetine (PAXIL) 30 MG tablet Take 1 tablet (30 mg total) by mouth daily. 03/28/15  Yes Amy Rusty AusLauren Krebs, NP  traZODone (DESYREL) 50 MG tablet Take 50 mg by mouth at bedtime.  03/07/15  Yes Historical Provider, MD  doxycycline (VIBRA-TABS) 100 MG tablet Take 1 tablet (100 mg total) by mouth 2 (two) times daily. Patient not taking: Reported on 06/21/2016 07/23/15   Amy Rusty AusLauren Krebs, NP  metoprolol tartrate (LOPRESSOR) 25 MG tablet Take 0.5 tablets (12.5 mg total) by mouth 2 (two) times daily. Patient not taking: Reported on 06/21/2016 03/28/15   Amy Rusty AusLauren Krebs, NP  nystatin cream (MYCOSTATIN) APPLY TOPICALLY 2 TIMES A DAY Patient not taking: Reported on 06/21/2016 05/14/15   Janeann ForehandJames H Hawkins Jr., MD      PHYSICAL EXAMINATION:   VITAL SIGNS: Blood pressure (!) 149/81, pulse 78, temperature 98.3 F (36.8 C), temperature source Oral, resp. rate 18, height 5\' 7"  (1.702 m), weight 234 lb (106.1 kg), SpO2 97 %.  GENERAL:  65 y.o.-year-old patient lying in the bed with no acute distress.  EYES: Pupils equal, round, reactive to light and accommodation. No scleral icterus. Extraocular muscles intact.  HEENT: Head atraumatic, normocephalic. Oropharynx and nasopharynx clear.  NECK:  Supple, no jugular venous distention. No thyroid enlargement, no tenderness.  LUNGS: Normal breath sounds bilaterally, no wheezing, rales,rhonchi or crepitation. No use of accessory muscles of respiration.  CARDIOVASCULAR: S1, S2 normal. No murmurs, rubs, or gallops.  ABDOMEN: Soft, epigastric and right upper quadrant tenderness, nondistended. Bowel sounds present. No organomegaly or mass.  EXTREMITIES: 1+ pedal edema, cyanosis, or clubbing.  NEUROLOGIC: Cranial nerves II through XII are intact. Muscle strength 5/5 in all extremities. Sensation intact. Gait not checked.  PSYCHIATRIC: The patient is alert and oriented x 3.  SKIN: No obvious rash, lesion, or ulcer.   LABORATORY PANEL:    CBC  Recent Labs Lab 06/21/16 1145  WBC 8.1  HGB 15.4  HCT 44.0  PLT 217  MCV 91.4  MCH 31.9  MCHC 34.9  RDW 14.2   ------------------------------------------------------------------------------------------------------------------  Chemistries   Recent Labs Lab 06/21/16 1145  NA 137  K 3.9  CL 100*  CO2 27  GLUCOSE 137*  BUN 14  CREATININE 1.03  CALCIUM 9.0  AST 67*  ALT 185*  ALKPHOS 196*  BILITOT 1.4*   ------------------------------------------------------------------------------------------------------------------ estimated creatinine clearance is 81.9 mL/min (by C-G formula based on SCr of 1.03 mg/dL). ------------------------------------------------------------------------------------------------------------------ No results for input(s): TSH, T4TOTAL, T3FREE, THYROIDAB in the last 72 hours.  Invalid input(s): FREET3   Coagulation profile No results for input(s): INR, PROTIME in the last 168 hours. ------------------------------------------------------------------------------------------------------------------- No results for input(s): DDIMER in the last 72 hours. -------------------------------------------------------------------------------------------------------------------  Cardiac Enzymes  Recent Labs Lab 06/21/16 1145  TROPONINI <0.03   ------------------------------------------------------------------------------------------------------------------ Invalid input(s): POCBNP  ---------------------------------------------------------------------------------------------------------------  Urinalysis    Component Value Date/Time   COLORURINE YELLOW (A) 06/21/2016 1205   APPEARANCEUR CLEAR (A) 06/21/2016 1205   LABSPEC 1.014 06/21/2016 1205   PHURINE 6.0 06/21/2016 1205  GLUCOSEU NEGATIVE 06/21/2016 1205   HGBUR NEGATIVE 06/21/2016 1205   BILIRUBINUR NEGATIVE 06/21/2016 1205   KETONESUR 1+ (A) 06/21/2016 1205   PROTEINUR 30 (A)  06/21/2016 1205   NITRITE NEGATIVE 06/21/2016 1205   LEUKOCYTESUR NEGATIVE 06/21/2016 1205     RADIOLOGY: Dg Abdomen Acute W/chest  Result Date: 06/21/2016 CLINICAL DATA:  Abdominal pain and vomiting for 2 days. Diarrhea for 1 day. EXAM: DG ABDOMEN ACUTE W/ 1V CHEST COMPARISON:  11/10/2010 chest radiograph. 04/13/2010 unenhanced CT abdomen/pelvis. FINDINGS: Stable cardiomediastinal silhouette with normal heart size and mildly tortuous atherosclerotic thoracic aorta. No pneumothorax. No pleural effusion. Mild elevation of the right hemidiaphragm. Mild right basilar atelectasis. No pulmonary edema. No acute consolidative airspace disease. No dilated small bowel loops or significant air-fluid levels. Physiologic stool throughout the large bowel. No evidence of pneumatosis or pneumoperitoneum. No pathologic soft tissue calcifications. Moderate thoracolumbar spondylosis. 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 IMPRESSION: 1. Mild right basilar atelectasis with mild elevation of the right hemidiaphragm. Otherwise no active disease in the chest. 2. Nonobstructive bowel gas pattern. 3. Aortic atherosclerosis. 4. 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 Electronically Signed   By: Delbert Phenix M.D.   On: 06/21/2016 12:56   US Abdomen Limited Ruq  Result Date: 06/21/2016 CLINICAL DATA:  Worsening abdominal pain and vomiting for several days. EXAM: US ABDOMEN LIMITED - RIGHT UPPER QUADRANT COMPARISON:  CT on 04/13/2010 and ultrasound on 04/01/2010 FINDINGS: Examination is suboptimal by large patient habitus and inability to cooperate. Gallbladder: Visualization of gallbladder is limited, however multiple tiny gallstones are again demonstrated. No definite evidence of gallbladder wall thickening or pericholecystic fluid. No sonographic Murphy sign noted by sonographer. Common bile duct: Diameter: 4 mm, within normal limits. Liver: Suboptimal visualization  due to large patient habitus. No focal lesion identified. Within normal limits in parenchymal echogenicity. IMPRESSION: Suboptimal exam due to large patient habitus and inability to cooperate. Cholelithiasis again noted, without definite evidence of cholecystitis or biliary dilatation. Electronically Signed   By: Myles Rosenthal M.D.   On: 06/21/2016 14:39    EKG: Orders placed or performed during the hospital encounter of 06/21/16  . ED EKG  . ED EKG  . EKG 12-Lead  . EKG 12-Lead  . EKG 12-Lead  . EKG 12-Lead  . EKG 12-Lead  . EKG 12-Lead    IMPRESSION AND PLAN: Patient is a 65 year old with multiple medical problems admitted under surgery  1. Diabetes type 2 With these GI symptoms I'll stop metformin for now Place patient on sliding scale insulin  2. Essential hypertension continue lisinopril   3. Hypothyroidism continue Synthroid  4. Miscellaneous continue Lovenox for DVT prophylaxis   All the records are reviewed and case discussed with ED provider. Management plans discussed with the patient, family and they are in agreement.  CODE STATUS:    Code Status Orders        Start     Ordered   06/21/16 1600  Full code  Continuous     06/21/16 1602    Code Status History    Date Active Date Inactive Code Status Order ID Comments User Context   This patient has a current code status but no historical code status.    Advance Directive Documentation   Flowsheet Row Most Recent Value  Type of Advance Directive  Healthcare Power of Attorney  Pre-existing out of facility DNR order (yellow form or pink MOST form)  No data  "MOST" Form in  Place?  No data       TOTAL TIME TAKING CARE OF THIS PATIENT: 55 minutes.    Auburn BilberryPATEL, Sehar Sedano M.D on 06/21/2016 at 6:48 PM  Between 7am to 6pm - Pager - [phone]  After 6pm go to [email or web address] - password [email or web address] [facility]  [facility]  Office  [phone]  CC: Primary care physician; Amy Diana EvesL Krebs, NP

## 2016-06-21 NOTE — Care Management Obs Status (Signed)
MEDICARE OBSERVATION STATUS NOTIFICATION   Patient Details  Name: Clayton Grebernest J Laufer Jr. MRN: [id number] Date of Birth: 08/15/1951   Medicare Observation Status Notification Given:  Yes (abdominal pain)    Caren MacadamMichelle Geoffrey Mankin, RN 06/21/2016, 4:43 PM

## 2016-06-21 NOTE — ED Notes (Signed)
ED Provider at bedside. 

## 2016-06-21 NOTE — ED Triage Notes (Signed)
[facility] c/o [place] X 2 days, diarrhea X 1 today.

## 2016-06-21 NOTE — ED Notes (Signed)
Pt assisted to bathroom

## 2016-06-21 NOTE — ED Notes (Signed)
Rocephin IV finished.

## 2016-06-21 NOTE — ED Provider Notes (Signed)
[name] Emergency Department Provider Note  Time seen: 12:17 PM  I have reviewed the triage vital signs and the nursing notes.   HISTORY  Chief Complaint Nausea and Emesis    HPI Clayton Grebernest J Uemura Jr. is a 65 y.o. male who presents to the emergency department from his nursing facility for upper abdominal discomfort, nausea, vomiting, diarrhea. EMS reported the past 3 days patient has been complaining of mild upper abdominal discomfort. He has been nauseated with several episodes of vomiting over the past 2 days, had an episode of diarrhea yesterday but none today. Patient describes abdominal pain as mild. Denies fever. Denies chest pain or trouble breathing.  Past Medical History:  Diagnosis Date  . Arthritis   . CKD stage 3 due to type 2 diabetes mellitus (HCC)   . Depression   . Diabetes mellitus without complication (HCC)   . GERD (gastroesophageal reflux disease)   . Vitamin D deficiency     Patient Active Problem List   Diagnosis Date Noted  . 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 Vitamin D deficiency 03/28/2015  . 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 Benign hypertension with CKD (chronic kidney disease), stage II 03/28/2015    Past Surgical History:  Procedure Laterality Date  . THYROIDECTOMY, PARTIAL      Prior to Admission medications   Medication Sig Start Date End Date Taking? Authorizing Provider  aspirin 81 MG tablet Take 81 mg by mouth daily.    Historical Provider, MD  Cholecalciferol 2000 UNITS CAPS Take 1 capsule (2,000 Units total) by mouth daily. 03/28/15   Amy Rusty AusLauren Krebs, NP  doxycycline (VIBRA-TABS) 100 MG tablet Take 1 tablet (100 mg total) by mouth 2 (two) times daily. 07/23/15   Amy Rusty AusLauren Krebs, NP  furosemide (LASIX) 20 MG tablet Take 1 tablet (20 mg total) by mouth daily. 07/23/15   Amy Rusty AusLauren Krebs, NP   levothyroxine (SYNTHROID, LEVOTHROID) 150 MCG tablet Take 1 tablet (150 mcg total) by mouth daily before breakfast. 03/28/15   Amy Rusty AusLauren Krebs, NP  lisinopril (PRINIVIL,ZESTRIL) 10 MG tablet Take 1.5 tablets (15 mg total) by mouth daily. 03/28/15   Amy Rusty AusLauren Krebs, NP  metFORMIN (GLUCOPHAGE) 500 MG tablet Take 1 tablet (500 mg total) by mouth 2 (two) times daily with a meal. 03/28/15   Amy Rusty AusLauren Krebs, NP  metoprolol tartrate (LOPRESSOR) 25 MG tablet Take 0.5 tablets (12.5 mg total) by mouth 2 (two) times daily. 03/28/15   Amy Rusty AusLauren Krebs, NP  mineral oil-hydrophilic petrolatum (AQUAPHOR) ointment Apply topically as needed for dry skin. Apply to legs daily. 05/14/15   Janeann ForehandJames H Hawkins Jr., MD  nystatin cream (MYCOSTATIN) APPLY TOPICALLY 2 TIMES A DAY 05/14/15   Janeann ForehandJames H Hawkins Jr., MD  PARoxetine (PAXIL) 30 MG tablet Take 1 tablet (30 mg total) by mouth daily. 03/28/15   Amy Rusty AusLauren Krebs, NP  traZODone (DESYREL) 50 MG tablet  03/07/15   Historical Provider, MD    Allergies  Allergen Reactions  . Shellfish Allergy     Family History  Problem Relation Age of Onset  . Diabetes Mother   . Cancer Father     Social History Social History  Substance Use Topics  . Smoking status: Never Smoker  . Smokeless tobacco: Never Used  . Alcohol use No    Review of Systems Constitutional: Negative for fever. Cardiovascular: Negative for chest pain. Respiratory: Negative for  shortness of breath. Gastrointestinal: Mild upper abdominal pain. Positive nausea, vomiting, one episode of diarrhea. Genitourinary: Negative for dysuria. Neurological: Negative for headache 10-point ROS otherwise negative.  ____________________________________________   PHYSICAL EXAM:  VITAL SIGNS: ED Triage Vitals  Enc Vitals Group     BP 06/21/16 1210 (!) 149/79     Pulse Rate 06/21/16 1143 69     Resp 06/21/16 1143 18     Temp --      Temp src --      SpO2 06/21/16 1143 95 %     Weight 06/21/16 1143 234 lb  (106.1 kg)     Height 06/21/16 1143 5\' 7"  (1.702 m)     Head Circumference --      Peak Flow --      Pain Score 06/21/16 1143 8     Pain Loc --      Pain Edu? --      Excl. in GC? --     Constitutional: Alert and oriented. Well appearing and in no distress. Eyes: Normal exam ENT   Head: Normocephalic and atraumatic   Mouth/Throat: Mucous membranes are moist. Cardiovascular: Normal rate, regular rhythm.  Respiratory: Normal respiratory effort without tachypnea nor retractions. Breath sounds are clear  Gastrointestinal: Soft, mild epigastric tenderness to palpation. No rebound or guarding. No distention. Abdomen otherwise benign. Musculoskeletal: Nontender with normal range of motion in all extremities.  Neurologic:  Normal speech and language. No gross focal neurologic deficits  Skin:  Skin is warm, dry and intact.  Psychiatric: Mood and affect are normal. Speech and behavior are normal.   ____________________________________________    EKG  EKG reviewed and interpreted by myself shows normal sinus rhythm at 71 bpm, narrow QRS, normal axis, normal intervals with no concerning ST changes.  ____________________________________________    RADIOLOGY   Ultrasound consistent with cholelithiasis without definitive cholecystitis. They noted difficulty examination due to body habitus and motion.  ____________________________________________   INITIAL IMPRESSION / ASSESSMENT AND PLAN / ED COURSE  Pertinent labs & imaging results that were available during my care of the patient were reviewed by me and considered in my medical decision making (see chart for details).  Patient presents to the emergency department with upper abdominal discomfort, nausea, vomiting, diarrhea. Overall the patient appears well, no distress, vitals are largely reassuring. We'll check labs including a lipase and troponin, treat with IV fluids, IV Zofran and obtain a three-way abdominal  x-ray.   Patient's evaluation consistent with elevated liver function tests. Ultrasound consistent with cholelithiasis without definitive cholecystitis. Given the patient's abdominal discomfort, 3 days of intermittent nausea/vomiting along with elevated LFTs with ultrasound findings I have consult to general surgery. Dr. Michela PitcherEly will be down shortly to see the patient.  Patient care signed out to oncoming physician. ____________________________________________     FINAL CLINICAL IMPRESSION(S) / ED DIAGNOSES  Abdominal pain Vomiting and diarrhea Biliary colic   Minna AntisKevin Baneza Bartoszek, MD 06/21/16 1504

## 2016-06-21 NOTE — ED Notes (Signed)
Called [facility]. Staff state they have no transportation available on the weekend but that they could help pt from a POV or cab into his house.

## 2016-06-21 NOTE — H&P (Signed)
Clayton Grebernest J Nicodemus Jr. is a 65 y.o. male  who lives in a group home with several days history of abdominal pain.  HPI: He has multiple medical problems but developed midepigastric right upper quadrant pain over the last several days which intensified today. His vomited several times. He is a very poor historian and is difficult to gather correct information. He's had problems in the past but they've all resolved spontaneously. He denies any history of hepatitis yellow jaundice pancreatitis peptic ulcer disease previous diagnosis of gallbladder disease or diverticulitis. Remainder of his history is taken from his chart. Workup in the emergency room revealed multiple stones but the examination was compromised with the patient's inability to cooperate. Does have slightly elevated bilirubin at 1.4. Transaminases topical phosphatase were slightly elevated. Surgical service was consulted for evaluation of possible biliary tract disease.  Past Medical History:  Diagnosis Date  . Arthritis   . CKD stage 3 due to type 2 diabetes mellitus (HCC)   . Depression   . Diabetes mellitus without complication (HCC)   . GERD (gastroesophageal reflux disease)   . Vitamin D deficiency    Past Surgical History:  Procedure Laterality Date  . THYROIDECTOMY, PARTIAL     Social History   Social History  . Marital status: Single    Spouse name: N/A  . Number of children: N/A  . Years of education: N/A   Social History Main Topics  . Smoking status: Never Smoker  . Smokeless tobacco: Never Used  . Alcohol use No  . Drug use: No  . Sexual activity: Not Asked   Other Topics Concern  . None   Social History Narrative  . None     ROS review of systems is not possible in with the patient's level of cooperation.   PHYSICAL EXAM: BP 136/81   Pulse 67   Resp (!) 22   Ht 5\' 7"  (1.702 m)   Wt 106.1 kg (234 lb)   SpO2 97%   BMI 36.65 kg/m   Physical Exam  Constitutional:  Morbidly obese  HENT:  Head:  Normocephalic and atraumatic.  Eyes: EOM are normal. Pupils are equal, round, and reactive to light.  Neck: Normal range of motion. Neck supple.  Cardiovascular: Normal rate and regular rhythm.   Pulmonary/Chest: Effort normal and breath sounds normal.  Abdominal: Soft. Bowel sounds are normal. There is tenderness. There is guarding. There is no rebound.  Musculoskeletal: Normal range of motion. He exhibits edema.  Neurological: He is alert.  Psychiatric:  He appears to have a flat affect but does respond appropriately to questioning. He does not appear to be appropriately oriented other than he is in the hospital.   See above  Impression/Plan: This gentleman appears to have symptomatic biliary tract disease. He does have some elevation liver function studies. His exam is difficult as is his ultrasound. We will hospitalize him at the present time place him on IV antibiotics asked for an internal medicine consult to assist with management of his other problems and observe him at the present time. He is in agreement with this plan.   Tiney Rougealph Ely III, MD  06/21/2016, 4:03 PM

## 2016-06-22 DIAGNOSIS — K8050 Calculus of bile duct without cholangitis or cholecystitis without obstruction: Secondary | ICD-10-CM | POA: Diagnosis not present

## 2016-06-22 DIAGNOSIS — K8070 Calculus of gallbladder and bile duct without cholecystitis without obstruction: Secondary | ICD-10-CM | POA: Diagnosis not present

## 2016-06-22 LAB — COMPREHENSIVE METABOLIC PANEL
ALK PHOS: 155 U/L — AB (ref 38–126)
ALT: 125 U/L — AB (ref 17–63)
AST: 38 U/L (ref 15–41)
Albumin: 3.4 g/dL — ABNORMAL LOW (ref 3.5–5.0)
Anion gap: 8 (ref 5–15)
BILIRUBIN TOTAL: 1.3 mg/dL — AB (ref 0.3–1.2)
BUN: 14 mg/dL (ref 6–20)
CALCIUM: 8.3 mg/dL — AB (ref 8.9–10.3)
CO2: 26 mmol/L (ref 22–32)
CREATININE: 0.86 mg/dL (ref 0.61–1.24)
Chloride: 102 mmol/L (ref 101–111)
GFR calc Af Amer: >60 mL/min (ref >60)
Glucose, Bld: 118 mg/dL — ABNORMAL HIGH (ref 65–99)
POTASSIUM: 3.5 mmol/L (ref 3.5–5.1)
Sodium: 136 mmol/L (ref 135–145)
TOTAL PROTEIN: 6.6 g/dL (ref 6.5–8.1)

## 2016-06-22 LAB — CBC
HEMATOCRIT: 40.6 % (ref 40.0–52.0)
Hemoglobin: 14.2 g/dL (ref 13.0–18.0)
MCH: 32 pg (ref 26.0–34.0)
MCHC: 35.1 g/dL (ref 32.0–36.0)
MCV: 91.3 fL (ref 80.0–100.0)
PLATELETS: 205 10*3/uL (ref 150–440)
RBC: 4.44 MIL/uL (ref 4.40–5.90)
RDW: 14 % (ref 11.5–14.5)
WBC: 9.5 10*3/uL (ref 3.8–10.6)

## 2016-06-22 LAB — PROTIME-INR
INR: 1.03
Prothrombin Time: 13.5 seconds (ref 11.4–15.2)

## 2016-06-22 LAB — MRSA PCR SCREENING: MRSA BY PCR: NEGATIVE

## 2016-06-22 LAB — GLUCOSE, CAPILLARY
GLUCOSE-CAPILLARY: 107 mg/dL — AB (ref 65–99)
GLUCOSE-CAPILLARY: 104 mg/dL — AB (ref 65–99)
GLUCOSE-CAPILLARY: 108 mg/dL — AB (ref 65–99)
Glucose-Capillary: 133 mg/dL — ABNORMAL HIGH (ref 65–99)

## 2016-06-22 MED ORDER — SODIUM CHLORIDE 0.9% FLUSH
3.0000 mL | Freq: Two times a day (BID) | INTRAVENOUS | Status: DC
  Administered 2016-06-22: 3 mL via INTRAVENOUS

## 2016-06-22 MED ORDER — HYDROMORPHONE HCL 1 MG/ML IJ SOLN: 0.5000 mg | INTRAMUSCULAR | Status: DC | PRN

## 2016-06-22 NOTE — Progress Notes (Addendum)
[facility] [place] [facility]   PATIENT NAME: Clayton Green    MR#:  [id number]  DATE OF BIRTH:  09/13/1950  SUBJECTIVE:  Came in with abdominal pain  Tolerating CLD  REVIEW OF SYSTEMS:   Review of Systems  Constitutional: Negative for chills, fever and weight loss.  HENT: Negative for ear discharge, ear pain and nosebleeds.   Eyes: Negative for blurred vision, pain and discharge.  Respiratory: Negative for sputum production, shortness of breath, wheezing and stridor.   Cardiovascular: Negative for chest pain, palpitations, orthopnea and PND.  Gastrointestinal: Negative for abdominal pain, diarrhea, nausea and vomiting.  Genitourinary: Negative for frequency and urgency.  Musculoskeletal: Negative for back pain and joint pain.  Neurological: Negative for sensory change, speech change, focal weakness and weakness.  Psychiatric/Behavioral: Negative for depression and hallucinations. The patient is not nervous/anxious.    Tolerating Diet:CLD Tolerating PT: ambulatory  DRUG ALLERGIES:   Allergies  Allergen Reactions  . Shellfish Allergy     VITALS:  Blood pressure (!) 142/78, pulse 70, temperature 98.3 F (36.8 C), temperature source Oral, resp. rate 18, height 5\' 7"  (1.702 m), weight 120.2 kg (264 lb 14.4 oz), SpO2 97 %.  PHYSICAL EXAMINATION:   Physical Exam  GENERAL:  65 y.o.-year-old patient lying in the bed with no acute distress. obese EYES: Pupils equal, round, reactive to light and accommodation. No scleral icterus. Extraocular muscles intact.  HEENT: Head atraumatic, normocephalic. Oropharynx and nasopharynx clear.  NECK:  Supple, no jugular venous distention. No thyroid enlargement, no tenderness.  LUNGS: Normal breath sounds bilaterally, no wheezing, rales, rhonchi. No use of accessory muscles of respiration.  CARDIOVASCULAR: S1, S2 normal. No murmurs, rubs, or gallops.  ABDOMEN: Soft, nontender, nondistended. Bowel sounds present.  No organomegaly or mass.  EXTREMITIES: No cyanosis, clubbing or edema b/l.    NEUROLOGIC: Cranial nerves II through XII are intact. No focal Motor or sensory deficits b/l.   PSYCHIATRIC:  patient is alert and oriented x 3.  SKIN: No obvious rash, lesion, or ulcer.   LABORATORY PANEL:  CBC  Recent Labs Lab 06/22/16 0601  WBC 9.5  HGB 14.2  HCT 40.6  PLT 205    Chemistries   Recent Labs Lab 06/22/16 0601  NA 136  K 3.5  CL 102  CO2 26  GLUCOSE 118*  BUN 14  CREATININE 0.86  CALCIUM 8.3*  AST 38  ALT 125*  ALKPHOS 155*  BILITOT 1.3*   Cardiac Enzymes  Recent Labs Lab 06/21/16 1145  TROPONINI <0.03   RADIOLOGY:  Dg Abdomen Acute W/chest  Result Date: 06/21/2016 CLINICAL DATA:  Abdominal pain and vomiting for 2 days. Diarrhea for 1 day. EXAM: DG ABDOMEN ACUTE W/ 1V CHEST COMPARISON:  11/10/2010 chest radiograph. 04/13/2010 unenhanced CT abdomen/pelvis. FINDINGS: Stable cardiomediastinal silhouette with normal heart size and mildly tortuous atherosclerotic thoracic aorta. No pneumothorax. No pleural effusion. Mild elevation of the right hemidiaphragm. Mild right basilar atelectasis. No pulmonary edema. No acute consolidative airspace disease. No dilated small bowel loops or significant air-fluid levels. Physiologic stool throughout the large bowel. No evidence of pneumatosis or pneumoperitoneum. No pathologic soft tissue calcifications. Moderate thoracolumbar spondylosis. 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 IMPRESSION: 1. Mild right basilar atelectasis with mild elevation of the right hemidiaphragm. Otherwise no active disease in the chest. 2. Nonobstructive bowel gas pattern. 3. Aortic atherosclerosis. 4. 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 Electronically Signed   By: Jannifer RodneyJason A Poff M.D.  On: 06/21/2016 12:56   [place] Abdomen Limited Ruq  Result Date: 06/21/2016 CLINICAL DATA:  Worsening abdominal pain and  vomiting for several days. EXAM: US ABDOMEN LIMITED - RIGHT UPPER QUADRANT COMPARISON:  CT on 04/13/2010 and ultrasound on 04/01/2010 FINDINGS: Examination is suboptimal by large patient habitus and inability to cooperate. Gallbladder: Visualization of gallbladder is limited, however multiple tiny gallstones are again demonstrated. No definite evidence of gallbladder wall thickening or pericholecystic fluid. No sonographic Murphy sign noted by sonographer. Common bile duct: Diameter: 4 mm, within normal limits. Liver: Suboptimal visualization due to large patient habitus. No focal lesion identified. Within normal limits in parenchymal echogenicity. IMPRESSION: Suboptimal exam due to large patient habitus and inability to cooperate. Cholelithiasis again noted, without definite evidence of cholecystitis or biliary dilatation. Electronically Signed   By: Myles RosenthalJohn  Stahl M.D.   On: 06/21/2016 14:39   ASSESSMENT AND PLAN:  65 year old with multiple medical problems admitted under surgery  1. Diabetes type 2 Resume metformin cont on sliding scale insulin  2. Essential hypertension continue lisinopril  3. Hypothyroidism continue Synthroid  4. Miscellaneous continue Lovenox for DVT prophylaxis  5.Biliary colic Per surgery -Pt tolerating PO CLD Case discussed with Care Management/Social Worker. Management plans discussed with the patient, family and they are in agreement.  CODE STATUS: full  DVT Prophylaxis: Lovenox  TOTAL TIME TAKING CARE OF THIS PATIENT: 30 minutes.  >50% time spent on counselling and coordination of care  POSSIBLE D/C IN 2 DAYS, DEPENDING ON CLINICAL CONDITION.  Note: This dictation was prepared with Dragon dictation along with smaller phrase technology. Any transcriptional errors that result from this process are unintentional.  Jamil Castillo M.D on 06/22/2016 at 5:26 PM  Between 7am to 6pm - Pager - [phone]  After 6pm go to [email or web address] - password [email or web address]  [facility]  [facility]  Office  [phone]  CC: Primary care physician; Amy Diana EvesL Krebs, NP

## 2016-06-22 NOTE — Progress Notes (Signed)
65 year old male with multiple medical issues here for recurrent biliary colic. The patient states that he has had no pain since coming in the hospital and that is much improved today. Patient states that he is hungry. Patient has been passing gas. He has been up and ambulating in the room. He does not wish to have any operations if he can avoid them.  Vitals:   06/22/16 1053 06/22/16 1228  BP: (!) 146/79 (!) 142/78  Pulse: 69 70  Resp:  18  Temp:  98.3 F (36.8 C)   I/O last 3 completed shifts: In: 4188 [P.O.:1120; I.V.:2068; IV Piggyback:1000] Out: 700 [Urine:700] Total I/O In: 120 [P.O.:120] Out: -    PE:  Gen: NAD Res: CTAB/L  Cardio: RRR Abd: soft, non tender, non distended, obese Psych: flat affect Ext: 2+ pulses, no edema  CBC Latest Ref Rng & Units 06/22/2016 06/21/2016  WBC 3.8 - 10.6 K/uL 9.5 8.1  Hemoglobin 13.0 - 18.0 g/dL 16.114.2 09.615.4  Hematocrit 04.540.0 - 52.0 % 40.6 44.0  Platelets 150 - 440 K/uL 205 217   CMP Latest Ref Rng & Units 06/22/2016 06/21/2016 08/06/2015  Glucose 65 - 99 mg/dL 409(W118(H) 119(J137(H) 478(G110(H)  BUN 6 - 20 mg/dL 14 14 20   Creatinine 0.61 - 1.24 mg/dL 9.560.86 2.131.03 0.861.12  Sodium 135 - 145 mmol/L 136 137 140  Potassium 3.5 - 5.1 mmol/L 3.5 3.9 4.1  Chloride 101 - 111 mmol/L 102 100(L) 104  CO2 22 - 32 mmol/L 26 27 32  Calcium 8.9 - 10.3 mg/dL 8.3(L) 9.0 8.9  Total Protein 6.5 - 8.1 g/dL 6.6 7.5 -  Total Bilirubin 0.3 - 1.2 mg/dL 5.7(Q1.3(H) 4.6(N1.4(H) -  Alkaline Phos 38 - 126 U/L 155(H) 196(H) -  AST 15 - 41 U/L 38 67(H) -  ALT 17 - 63 U/L 125(H) 185(H) -   A/P:  65 year old male with multiple medical issues here for recurrent biliary colic.  He is not having pain today, will advance to clear liquids, if he does well with this can advance his diet tomorrow and possibly DC home, appreciate medicine help with hypertension and diabetes.

## 2016-06-22 NOTE — Progress Notes (Signed)
Pt was receptive of Ch. Pt lives in Assisted Living facility. No close family. CH offered prayer. Pt confessed to having bouts of depression. CH offereed to stay and chat but Pt okay for now. CH is available.   06/22/16 1130  Clinical Encounter Type  Visited With Patient  Visit Type Initial  Referral From Nurse  Spiritual Encounters  Spiritual Needs Prayer  Stress Factors  Patient Stress Factors Health changes

## 2016-06-22 NOTE — Clinical Social Work Note (Signed)
Clinical Social Work Assessment  Patient Details  Name: Clayton Grebernest J Katich Jr. MRN: [id number] Date of Birth: 07/24/1951  Date of referral:  06/22/16               Reason for consult:  Facility Placement                Permission sought to share information with:  Facility Industrial/product designerContact Representative Permission granted to share information::  Yes, Verbal Permission Granted  Name::        Agency::     Relationship::     Contact Information:     Housing/Transportation Living arrangements for the past 2 months:  Assisted Living Facility Source of Information:  Patient, Facility Patient Interpreter Needed:  None Criminal Activity/Legal Involvement Pertinent to Current Situation/Hospitalization:  No - Comment as needed Significant Relationships:    Lives with:    Do you feel safe going back to the place where you live?  Yes Need for family participation in patient care:  No (Coment)  Care giving concerns:  Patient resides at [facility] and requires ADL assistance.  Social Worker assessment / plan:  CSW spoke with Tammy at [facility] this morning to update her and to let her know that the physician has stated that patient may discharge tomorrow. CSW spoke with patient this morning as well and he is pleased to know that he may discharge tomorrow as he states he would rather be home. CSW informed patient that CSW would assist with transfer back to facility when time.   Employment status:  Disabled (Comment on whether or not currently receiving Disability) Insurance information:  Medicare PT Recommendations:  Not assessed at this time Information / Referral to community resources:     Patient/Family's Response to care:  Patient expressed appreciation for CSW assistance.  Patient/Family's Understanding of and Emotional Response to Diagnosis, Current Treatment, and Prognosis:  Patient is looking forward to being able to return home potentially tomorrow.   Emotional Assessment Appearance:   Appears stated age Attitude/Demeanor/Rapport:   (pleasant and cooperative) Affect (typically observed):  Accepting, Adaptable, Pleasant Orientation:  Oriented to Self, Oriented to Place, Oriented to  Time, Oriented to Situation Alcohol / Substance use:  Not Applicable Psych involvement (Current and /or in the community):  No (Comment)  Discharge Needs  Concerns to be addressed:  No discharge needs identified Readmission within the last 30 days:  No Current discharge risk:  None Barriers to Discharge:  No Barriers Identified   York SpanielMonica Deni Berti, LCSW 06/22/2016, 10:20 AM

## 2016-06-23 DIAGNOSIS — K8070 Calculus of gallbladder and bile duct without cholecystitis without obstruction: Secondary | ICD-10-CM | POA: Diagnosis not present

## 2016-06-23 DIAGNOSIS — K8050 Calculus of bile duct without cholangitis or cholecystitis without obstruction: Secondary | ICD-10-CM | POA: Diagnosis not present

## 2016-06-23 LAB — COMPREHENSIVE METABOLIC PANEL
ALK PHOS: 135 U/L — AB (ref 38–126)
ALT: 87 U/L — ABNORMAL HIGH (ref 17–63)
ANION GAP: 7 (ref 5–15)
AST: 26 U/L (ref 15–41)
Albumin: 3.2 g/dL — ABNORMAL LOW (ref 3.5–5.0)
BUN: 11 mg/dL (ref 6–20)
CALCIUM: 8.3 mg/dL — AB (ref 8.9–10.3)
CO2: 26 mmol/L (ref 22–32)
Chloride: 103 mmol/L (ref 101–111)
Creatinine, Ser: 0.75 mg/dL (ref 0.61–1.24)
GFR calc non Af Amer: >60 mL/min (ref >60)
Glucose, Bld: 123 mg/dL — ABNORMAL HIGH (ref 65–99)
POTASSIUM: 3.3 mmol/L — AB (ref 3.5–5.1)
SODIUM: 136 mmol/L (ref 135–145)
Total Bilirubin: 1.3 mg/dL — ABNORMAL HIGH (ref 0.3–1.2)
Total Protein: 6.4 g/dL — ABNORMAL LOW (ref 6.5–8.1)

## 2016-06-23 LAB — CBC
HCT: 42.4 % (ref 40.0–52.0)
Hemoglobin: 14.4 g/dL (ref 13.0–18.0)
MCH: 31.1 pg (ref 26.0–34.0)
MCHC: 33.9 g/dL (ref 32.0–36.0)
MCV: 91.7 fL (ref 80.0–100.0)
PLATELETS: 211 10*3/uL (ref 150–440)
RBC: 4.62 MIL/uL (ref 4.40–5.90)
RDW: 14.2 % (ref 11.5–14.5)
WBC: 12.4 10*3/uL — ABNORMAL HIGH (ref 3.8–10.6)

## 2016-06-23 LAB — GLUCOSE, CAPILLARY
GLUCOSE-CAPILLARY: 98 mg/dL (ref 65–99)
GLUCOSE-CAPILLARY: 123 mg/dL — AB (ref 65–99)

## 2016-06-23 MED ORDER — FUROSEMIDE 20 MG PO TABS: 20.0000 mg | ORAL_TABLET | ORAL | Status: DC

## 2016-06-23 MED ORDER — METFORMIN HCL 500 MG PO TABS: 500.0000 mg | ORAL_TABLET | Freq: Every day | ORAL | Status: DC

## 2016-06-23 MED ORDER — HYDROCODONE-ACETAMINOPHEN 5-325 MG PO TABS: 1.0000 | ORAL_TABLET | Freq: Three times a day (TID) | ORAL | 0 refills | Status: DC | PRN

## 2016-06-23 MED ORDER — POTASSIUM CHLORIDE 20 MEQ PO PACK: 40.0000 meq | PACK | Freq: Once | ORAL | Status: DC

## 2016-06-23 MED ORDER — METFORMIN HCL 500 MG PO TABS: 500.0000 mg | ORAL_TABLET | Freq: Two times a day (BID) | ORAL | Status: DC

## 2016-06-23 MED ORDER — ENOXAPARIN SODIUM 40 MG/0.4ML ~~LOC~~ SOLN
40.0000 mg | Freq: Two times a day (BID) | SUBCUTANEOUS | Status: DC
  Administered 2016-06-23: 40 mg via SUBCUTANEOUS
  Filled 2016-06-23: qty 0.4

## 2016-06-23 NOTE — Progress Notes (Signed)
Pt was covered up and seemed cold but declined offer of additional blanket. Long conversation about Pt's loneliness and depression. CH offered prayer. CH is available.

## 2016-06-23 NOTE — Progress Notes (Signed)
Pt with an order for lovenox 40mg  q 24 hr. Pt BMI is >40. Therefore, per protocol will increase frequency to 40mg  q 12 hr.  [place] Wherry D Tiffnay Bossi, Pharm.D Clinical Pharmacist

## 2016-06-23 NOTE — NC FL2 (Signed)
[place] MEDICAID FL2 LEVEL OF CARE SCREENING TOOL     IDENTIFICATION  Patient Name: Clayton Grebernest J Sudano Jr. Birthdate: 07/24/1951 Sex: male Admission Date (Current Location): 06/21/2016  [place]   Facility and Address:  [facility], [address], [place], [place] [address]      Provider Number: [id number]  Attending Physician Name and Address:  Enedina FinnerSona Patel, MD  Relative Name and Phone Number:       Current Level of Care: Hospital Recommended Level of Care: Assisted Living Facility Prior Approval Number:    Date Approved/Denied:   PASRR Number:    Discharge Plan:  (ALF)    Current Diagnoses: Patient Active Problem List   Diagnosis Date Noted  . Biliary colic 06/21/2016  . Cholelithiasis 06/21/2016  . 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 Vitamin D deficiency 03/28/2015  . 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 Benign hypertension with CKD (chronic kidney disease), stage II 03/28/2015    Orientation RESPIRATION BLADDER Height & Weight     Self, Time, Situation, Place  Normal Continent Weight: 264 lb 14.4 oz (120.2 kg) Height:  5\' 7"  (170.2 cm)  BEHAVIORAL SYMPTOMS/MOOD NEUROLOGICAL BOWEL NUTRITION STATUS   (none)  (none) Continent Diet (soft)  AMBULATORY STATUS COMMUNICATION OF NEEDS Skin   Limited Assist Verbally Normal                       Personal Care Assistance Level of Assistance  Bathing, Dressing Bathing Assistance: Limited assistance   Dressing Assistance: Limited assistance     Functional Limitations Info   (no issues)          SPECIAL CARE FACTORS FREQUENCY                       Contractures Contractures Info: Not present    Additional Factors Info  Code Status, Allergies Code Status Info: full Allergies Info: shellfish           DISCHARGE MEDICATIONS:        Current Discharge Medication List        START taking these medications   Details  HYDROcodone-acetaminophen (NORCO/VICODIN) 5-325 MG tablet Take 1-2 tablets by mouth every 8 (eight) hours as needed for moderate pain. Qty: 25 tablet, Refills: 0          CONTINUE these medications which have NOT CHANGED   Details  acetaminophen (TYLENOL) 500 MG tablet Take 500 mg by mouth 2 (two) times daily as needed.    aspirin 81 MG tablet Take 81 mg by mouth daily.    Cholecalciferol 2000 UNITS CAPS Take 1 capsule (2,000 Units total) by mouth daily. Qty: 30 each, Refills: 11    furosemide (LASIX) 20 MG tablet Take 1 tablet (20 mg total) by mouth daily. Qty: 30 tablet, Refills: 11   Associated Diagnoses: Leg swelling    guaiFENesin-dextromethorphan (ROBITUSSIN DM) 100-10 MG/5ML syrup Take 15 mLs by mouth 3 (three) times daily as needed for cough.    levothyroxine (SYNTHROID, LEVOTHROID) 150 MCG tablet Take 1 tablet (150 mcg total) by mouth daily before breakfast. Qty: 30 tablet, Refills: 11    lisinopril (PRINIVIL,ZESTRIL) 10 MG tablet Take 1.5 tablets (15 mg total) by mouth daily. Qty: 45 tablet, Refills: 11    metFORMIN (GLUCOPHAGE) 500 MG tablet Take 1 tablet (500 mg total) by mouth 2 (two)  times daily with a meal. Qty: 60 tablet, Refills: 11    mineral oil-hydrophilic petrolatum (AQUAPHOR) ointment Apply topically as needed for dry skin. Apply to legs daily. Qty: 420 g, Refills: 0    ondansetron (ZOFRAN) 4 MG tablet Take 4 mg by mouth every 6 (six) hours as needed for nausea or vomiting.    PARoxetine (PAXIL) 30 MG tablet Take 1 tablet (30 mg total) by mouth daily. Qty: 30 tablet, Refills: 11    traZODone (DESYREL) 50 MG tablet Take 50 mg by mouth at bedtime.         DISCHARGE MEDICATIONS:       Current Discharge Medication List        START taking these medications   Details  HYDROcodone-acetaminophen (NORCO/VICODIN) 5-325 MG tablet Take  1-2 tablets by mouth every 8 (eight) hours as needed for moderate pain. Qty: 25 tablet, Refills: 0          CONTINUE these medications which have NOT CHANGED   Details  acetaminophen (TYLENOL) 500 MG tablet Take 500 mg by mouth 2 (two) times daily as needed.    aspirin 81 MG tablet Take 81 mg by mouth daily.    Cholecalciferol 2000 UNITS CAPS Take 1 capsule (2,000 Units total) by mouth daily. Qty: 30 each, Refills: 11    furosemide (LASIX) 20 MG tablet Take 1 tablet (20 mg total) by mouth daily. Qty: 30 tablet, Refills: 11   Associated Diagnoses: Leg swelling    guaiFENesin-dextromethorphan (ROBITUSSIN DM) 100-10 MG/5ML syrup Take 15 mLs by mouth 3 (three) times daily as needed for cough.    levothyroxine (SYNTHROID, LEVOTHROID) 150 MCG tablet Take 1 tablet (150 mcg total) by mouth daily before breakfast. Qty: 30 tablet, Refills: 11    lisinopril (PRINIVIL,ZESTRIL) 10 MG tablet Take 1.5 tablets (15 mg total) by mouth daily. Qty: 45 tablet, Refills: 11    metFORMIN (GLUCOPHAGE) 500 MG tablet Take 1 tablet (500 mg total) by mouth 2 (two) times daily with a meal. Qty: 60 tablet, Refills: 11    mineral oil-hydrophilic petrolatum (AQUAPHOR) ointment Apply topically as needed for dry skin. Apply to legs daily. Qty: 420 g, Refills: 0    ondansetron (ZOFRAN) 4 MG tablet Take 4 mg by mouth every 6 (six) hours as needed for nausea or vomiting.    PARoxetine (PAXIL) 30 MG tablet Take 1 tablet (30 mg total) by mouth daily. Qty: 30 tablet, Refills: 11    traZODone (DESYREL) 50 MG tablet Take 50 mg by mouth at bedtime.        Relevant Lab Results:  Additional Information    York SpanielMonica Jai Steil, LCSW

## 2016-06-23 NOTE — Discharge Summary (Signed)
[facility] [place] [facility]   PATIENT NAME: Clayton Green    MR#:  [id number]  DATE OF BIRTH:  02/20/1951  DATE OF ADMISSION:  06/21/2016 ADMITTING PHYSICIAN: Tiney Rougealph Ely III, MD  DATE OF DISCHARGE: 06/23/16  PRIMARY CARE PHYSICIAN: Filbert BertholdAmy L Krebs, NP    ADMISSION DIAGNOSIS:  Biliary colic [K80.50]  DISCHARGE DIAGNOSIS:  Biliary Colic-recurrent Gall stones now asymptomatic-manage non surgically  Dm-2 SECONDARY DIAGNOSIS:   Past Medical History:  Diagnosis Date  . Arthritis   . CKD stage 3 due to type 2 diabetes mellitus (HCC)   . Depression   . Diabetes mellitus without complication (HCC)   . GERD (gastroesophageal reflux disease)   . Hypothyroid   . Vitamin D deficiency     HOSPITAL COURSE:   65 year old with multiple medical problems admitted under surgery  1. Diabetes type 2 Resume metformin cont on sliding scale insulin  2. Essential hypertension continue lisinopril  3. Hypothyroidism continue Synthroid  4. Miscellaneous continue Lovenox for DVT prophylaxis  5.Biliary colic Per surgery -Pt tolerating PO CLD----change to carb control diet.    - No pain. No plans for surgery noted -if pt tolerates po diet then will d/c him later. Spoke with dr Orvis Brillloflin  CONSULTS OBTAINED:  Treatment Team:  Auburn BilberryShreyang Lanard Arguijo, MD  DRUG ALLERGIES:   Allergies  Allergen Reactions  . Shellfish Allergy     DISCHARGE MEDICATIONS:   Current Discharge Medication List    START taking these medications   Details  HYDROcodone-acetaminophen (NORCO/VICODIN) 5-325 MG tablet Take 1-2 tablets by mouth every 8 (eight) hours as needed for moderate pain. Qty: 25 tablet, Refills: 0      CONTINUE these medications which have NOT CHANGED   Details  acetaminophen (TYLENOL) 500 MG tablet Take 500 mg by mouth 2 (two) times daily as needed.    aspirin 81 MG tablet Take 81 mg by mouth daily.    Cholecalciferol 2000 UNITS CAPS Take 1 capsule (2,000 Units  total) by mouth daily. Qty: 30 each, Refills: 11    furosemide (LASIX) 20 MG tablet Take 1 tablet (20 mg total) by mouth daily. Qty: 30 tablet, Refills: 11   Associated Diagnoses: Leg swelling    guaiFENesin-dextromethorphan (ROBITUSSIN DM) 100-10 MG/5ML syrup Take 15 mLs by mouth 3 (three) times daily as needed for cough.    levothyroxine (SYNTHROID, LEVOTHROID) 150 MCG tablet Take 1 tablet (150 mcg total) by mouth daily before breakfast. Qty: 30 tablet, Refills: 11    lisinopril (PRINIVIL,ZESTRIL) 10 MG tablet Take 1.5 tablets (15 mg total) by mouth daily. Qty: 45 tablet, Refills: 11    metFORMIN (GLUCOPHAGE) 500 MG tablet Take 1 tablet (500 mg total) by mouth 2 (two) times daily with a meal. Qty: 60 tablet, Refills: 11    mineral oil-hydrophilic petrolatum (AQUAPHOR) ointment Apply topically as needed for dry skin. Apply to legs daily. Qty: 420 g, Refills: 0    ondansetron (ZOFRAN) 4 MG tablet Take 4 mg by mouth every 6 (six) hours as needed for nausea or vomiting.    PARoxetine (PAXIL) 30 MG tablet Take 1 tablet (30 mg total) by mouth daily. Qty: 30 tablet, Refills: 11    traZODone (DESYREL) 50 MG tablet Take 50 mg by mouth at bedtime.         If you experience worsening of your admission symptoms, develop shortness of breath, life threatening emergency, suicidal or homicidal thoughts you must seek medical attention immediately by calling 911 or calling your  MD immediately  if symptoms less severe.  You Must read complete instructions/literature along with all the possible adverse reactions/side effects for all the Medicines you take and that have been prescribed to you. Take any new Medicines after you have completely understood and accept all the possible adverse reactions/side effects.   Please note  You were cared for by a hospitalist during your hospital stay. If you have any questions about your discharge medications or the care you received while you were in the  hospital after you are discharged, you can call the unit and asked to speak with the hospitalist on call if the hospitalist that took care of you is not available. Once you are discharged, your primary care physician will handle any further medical issues. Please note that NO REFILLS for any discharge medications will be authorized once you are discharged, as it is imperative that you return to your primary care physician (or establish a relationship with a primary care physician if you do not have one) for your aftercare needs so that they can reassess your need for medications and monitor your lab values. Today   SUBJECTIVE   Doing well  VITAL SIGNS:  Blood pressure 131/74, pulse 74, temperature 98.7 F (37.1 C), temperature source Oral, resp. rate 18, height 5\' 7"  (1.702 m), weight 120.2 kg (264 lb 14.4 oz), SpO2 96 %.  I/O:   Intake/Output Summary (Last 24 hours) at 06/23/16 1255 Last data filed at 06/23/16 1016  Gross per 24 hour  Intake          2794.25 ml  Output             1425 ml  Net          1369.25 ml    PHYSICAL EXAMINATION:  GENERAL:  65 y.o.-year-old patient lying in the bed with no acute distress.  EYES: Pupils equal, round, reactive to light and accommodation. No scleral icterus. Extraocular muscles intact.  HEENT: Head atraumatic, normocephalic. Oropharynx and nasopharynx clear.  NECK:  Supple, no jugular venous distention. No thyroid enlargement, no tenderness.  LUNGS: Normal breath sounds bilaterally, no wheezing, rales,rhonchi or crepitation. No use of accessory muscles of respiration.  CARDIOVASCULAR: S1, S2 normal. No murmurs, rubs, or gallops.  ABDOMEN: Soft, non-tender, non-distended. Bowel sounds present. No organomegaly or mass.  EXTREMITIES: No pedal edema, cyanosis, or clubbing.  NEUROLOGIC: Cranial nerves II through XII are intact. Muscle strength 5/5 in all extremities. Sensation intact. Gait not checked.  PSYCHIATRIC: The patient is alert and oriented x  3.  SKIN: No obvious rash, lesion, or ulcer.   DATA REVIEW:   CBC   Recent Labs Lab 06/23/16 0450  WBC 12.4*  HGB 14.4  HCT 42.4  PLT 211    Chemistries   Recent Labs Lab 06/23/16 0450  NA 136  K 3.3*  CL 103  CO2 26  GLUCOSE 123*  BUN 11  CREATININE 0.75  CALCIUM 8.3*  AST 26  ALT 87*  ALKPHOS 135*  BILITOT 1.3*    Microbiology Results   Recent Results (from the past 240 hour(s))  MRSA PCR Screening     Status: None   Collection Time: 06/22/16  4:27 AM  Result Value Ref Range Status   MRSA by PCR NEGATIVE NEGATIVE Final    Comment:        The GeneXpert MRSA Assay (FDA approved for NASAL specimens only), is one component of a comprehensive MRSA colonization surveillance program. It is not intended to diagnose  MRSA infection nor to guide or monitor treatment for MRSA infections.     RADIOLOGY:  [place] Abdomen Limited Ruq  Result Date: 06/21/2016 CLINICAL DATA:  Worsening abdominal pain and vomiting for several days. EXAM: US ABDOMEN LIMITED - RIGHT UPPER QUADRANT COMPARISON:  CT on 04/13/2010 and ultrasound on 04/01/2010 FINDINGS: Examination is suboptimal by large patient habitus and inability to cooperate. Gallbladder: Visualization of gallbladder is limited, however multiple tiny gallstones are again demonstrated. No definite evidence of gallbladder wall thickening or pericholecystic fluid. No sonographic Murphy sign noted by sonographer. Common bile duct: Diameter: 4 mm, within normal limits. Liver: Suboptimal visualization due to large patient habitus. No focal lesion identified. Within normal limits in parenchymal echogenicity. IMPRESSION: Suboptimal exam due to large patient habitus and inability to cooperate. Cholelithiasis again noted, without definite evidence of cholecystitis or biliary dilatation. Electronically Signed   By: Myles RosenthalJohn  Stahl M.D.   On: 06/21/2016 14:39     Management plans discussed with the patient, family and they are in  agreement.  CODE STATUS:     Code Status Orders        Start     Ordered   06/21/16 1600  Full code  Continuous     06/21/16 1602    Code Status History    Date Active Date Inactive Code Status Order ID Comments User Context   This patient has a current code status but no historical code status.    Advance Directive Documentation   Flowsheet Row Most Recent Value  Type of Advance Directive  Healthcare Power of Attorney  Pre-existing out of facility DNR order (yellow form or pink MOST form)  No data  "MOST" Form in Place?  No data      TOTAL TIME TAKING CARE OF THIS PATIENT: 40 minutes.    Lorraine Terriquez M.D on 06/23/2016 at 12:55 PM  Between 7am to 6pm - Pager - [phone] After 6pm go to [email or web address] - password [email or web address] [facility]  [facility]  Office  [phone]  CC: Primary care physician; Amy Diana EvesL Krebs, NP

## 2016-06-23 NOTE — Progress Notes (Signed)
Pt to be discharged per MD order. Iv removed. Pt dressed and picked up by [name] from [facility]. Report given at bedside. Pt in wheelchair and taken down by students. FL2 provided to [place] as well as scripts

## 2016-07-06 ENCOUNTER — Ambulatory Visit (INDEPENDENT_AMBULATORY_CARE_PROVIDER_SITE_OTHER): Payer: Medicare Other | Admitting: Surgery

## 2016-07-06 ENCOUNTER — Encounter: Payer: Self-pay | Admitting: Surgery

## 2016-07-06 ENCOUNTER — Other Ambulatory Visit
Admission: RE | Admit: 2016-07-06 | Discharge: 2016-07-06 | Disposition: A | Discharge status: Home / Self Care | Payer: Medicare Other | Source: Ambulatory Visit | Attending: Surgery | Admitting: Surgery

## 2016-07-06 VITALS — BP 155/86 | HR 74 | Temp 98.7°F | Ht 67.5 in

## 2016-07-06 DIAGNOSIS — K8010 Calculus of gallbladder with chronic cholecystitis without obstruction: Secondary | ICD-10-CM | POA: Diagnosis present

## 2016-07-06 LAB — COMPREHENSIVE METABOLIC PANEL
ALT: 15 U/L — AB (ref 17–63)
AST: 19 U/L (ref 15–41)
Albumin: 3.9 g/dL (ref 3.5–5.0)
Alkaline Phosphatase: 112 U/L (ref 38–126)
Anion gap: 10 (ref 5–15)
BILIRUBIN TOTAL: 0.8 mg/dL (ref 0.3–1.2)
BUN: 14 mg/dL (ref 6–20)
CHLORIDE: 99 mmol/L — AB (ref 101–111)
CO2: 29 mmol/L (ref 22–32)
CREATININE: 1.14 mg/dL (ref 0.61–1.24)
Calcium: 8.7 mg/dL — ABNORMAL LOW (ref 8.9–10.3)
Glucose, Bld: 98 mg/dL (ref 65–99)
POTASSIUM: 3.8 mmol/L (ref 3.5–5.1)
Sodium: 138 mmol/L (ref 135–145)
TOTAL PROTEIN: 7.5 g/dL (ref 6.5–8.1)

## 2016-07-06 NOTE — Addendum Note (Signed)
Addended by: Cameron ProudHILDERS, WINDELLA S on: 07/06/2016 03:01 PM   Modules accepted: Orders

## 2016-07-06 NOTE — Progress Notes (Signed)
Patient ID: Clayton Grebernest J Puerto Jr., male   DOB: 07/14/1951, 65 y.o.   MRN: [id number]  HPI Clayton Grebernest J Aldape Jr. is a 65 y.o. male f/u after being hospitalized for biliary disease ? Cholecystitis that was managed medically.  Of significant is important is the fact that he was in an assisted living and previous to that her nursing home. Over the last few years patient overall medical condition and overall health has deteriorated to the point that he requires significant assistance for ADLs. He is able to walk only for a few feet with some assistance. He cannot stand for prolonged periods of time. He attributes this to his severe back disease. He is diabetic and has also chronic kidney disease that is well controlled. Recently he was admitted to the hospital for questionable cholecystitis with an increase in the bilirubin and alkaline phosphatase. The ultrasound I personally reviewed there is very limited because of the body habitus and also patient able to cooperate. But there was gallstones and difficult to assess biliary anatomy and evidence of cholecystitis. Patient did state today that there is no abdominal pain no nausea no vomiting, he is able to tolerate by mouth. No fevers or chills no evidence of cholangitis.  HPI  Past Medical History:  Diagnosis Date  . Arthritis   . CKD stage 3 due to type 2 diabetes mellitus (HCC)   . Depression   . Diabetes mellitus without complication (HCC)   . GERD (gastroesophageal reflux disease)   . Hypothyroid   . Vitamin D deficiency     Past Surgical History:  Procedure Laterality Date  . THYROIDECTOMY, PARTIAL      Family History  Problem Relation Age of Onset  . Diabetes Mother   . Cancer Father     Social History Social History  Substance Use Topics  . Smoking status: Never Smoker  . Smokeless tobacco: Never Used  . Alcohol use No    Allergies  Allergen Reactions  . Shellfish Allergy     Current Outpatient Prescriptions  Medication Sig  Dispense Refill  . acetaminophen (TYLENOL) 500 MG tablet Take 500 mg by mouth 2 (two) times daily as needed.    Marland Kitchen. aspirin 81 MG tablet Take 81 mg by mouth daily.    . Cholecalciferol 2000 UNITS CAPS Take 1 capsule (2,000 Units total) by mouth daily. 30 each 11  . furosemide (LASIX) 20 MG tablet Take 1 tablet (20 mg total) by mouth daily. (Patient taking differently: Take 20 mg by mouth every other day. ) 30 tablet 11  . guaiFENesin-dextromethorphan (ROBITUSSIN DM) 100-10 MG/5ML syrup Take 15 mLs by mouth 3 (three) times daily as needed for cough.    Marland Kitchen. HYDROcodone-acetaminophen (NORCO/VICODIN) 5-325 MG tablet Take 1-2 tablets by mouth every 8 (eight) hours as needed for moderate pain. 25 tablet 0  . levothyroxine (SYNTHROID, LEVOTHROID) 150 MCG tablet Take 1 tablet (150 mcg total) by mouth daily before breakfast. 30 tablet 11  . lisinopril (PRINIVIL,ZESTRIL) 10 MG tablet Take 1.5 tablets (15 mg total) by mouth daily. 45 tablet 11  . metFORMIN (GLUCOPHAGE) 500 MG tablet Take 1 tablet (500 mg total) by mouth 2 (two) times daily with a meal. (Patient taking differently: Take 500 mg by mouth daily. ) 60 tablet 11  . mineral oil-hydrophilic petrolatum (AQUAPHOR) ointment Apply topically as needed for dry skin. Apply to legs daily. 420 g 0  . ondansetron (ZOFRAN) 4 MG tablet Take 4 mg by mouth every 6 (six) hours as needed  for nausea or vomiting.    Marland Kitchen. PARoxetine (PAXIL) 30 MG tablet Take 1 tablet (30 mg total) by mouth daily. 30 tablet 11  . traZODone (DESYREL) 50 MG tablet Take 50 mg by mouth at bedtime.      No current facility-administered medications for this visit.      Review of Systems A 10 point review of systems was asked and was negative except for the information on the HPI  Physical Exam Blood pressure (!) 155/86, pulse 74, temperature 98.7 F (37.1 C), temperature source Oral, height 5' 7.5" (1.715 m). CONSTITUTIONAL: NAD obese, in a wheelchair EYES: Pupils are equal, round, and  reactive to light, Sclera are non-icteric. EARS, NOSE, MOUTH AND THROAT: The oropharynx is clear. The oral mucosa is pink and moist. Hearing is intact to voice. LYMPH NODES:  Lymph nodes in the neck are normal. RESPIRATORY:  Lungs are clear. There is normal respiratory effort, with equal breath sounds bilaterally, and without pathologic use of accessory muscles. CARDIOVASCULAR: Heart is regular without murmurs, gallops, or rubs. GI: The abdomen is soft, nontender, and nondistended. There are no palpable masses. There is no hepatosplenomegaly. There are normal bowel sounds in all quadrants. GU: Rectal deferred.   MUSCULOSKELETAL: Normal muscle strength and tone. No cyanosis or edema.   SKIN: Turgor is good and there are no pathologic skin lesions or ulcers.PSYCH:  Oriented to person, place and time. Affect is normal.  Data Reviewed  I have personally reviewed the patient's imaging, laboratory findings and medical records.    Assessment/Plan Symptomatic gallstones in a patient with multiple comorbidities mainly significant failure to thrive and deconditioning. Patient is slowly recovering from this but I do not anticipate that he will be able to be very mobile in the future. 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 Discussed with him in detail about his disease and he is adamant that he does not want any surgical intervention always is absolutely necessary. I will like to at least follow him up with a repeat ultrasound and LFTs and to make sure his biliary issues are improving. I'll see her back in a few weeks with a repeat studies. No need for emergency surgery at this time        Sterling Bigiego Taryn Nave, MD FACS General Surgeon 07/06/2016, 2:39 PM

## 2016-07-06 NOTE — Patient Instructions (Signed)
We would like for you to go to the lab today.We have [facility] Ul[facility]. We will call you with the results. Please see your follow up appointment listed below.

## 2016-07-07 ENCOUNTER — Ambulatory Visit: Payer: Medicare Other | Admitting: Surgery

## 2016-07-07 ENCOUNTER — Inpatient Hospital Stay: Payer: Medicare Other | Admitting: Family Medicine

## 2016-07-13 ENCOUNTER — Ambulatory Visit: Payer: Medicare Other

## 2016-07-14 ENCOUNTER — Ambulatory Visit
Admission: RE | Admit: 2016-07-14 | Discharge: 2016-07-14 | Disposition: A | Discharge status: Home / Self Care | Payer: Medicare Other | Source: Ambulatory Visit | Attending: Surgery | Admitting: Surgery

## 2016-07-14 DIAGNOSIS — K760 Fatty (change of) liver, not elsewhere classified: Secondary | ICD-10-CM | POA: Diagnosis not present

## 2016-07-14 DIAGNOSIS — K8010 Calculus of gallbladder with chronic cholecystitis without obstruction: Secondary | ICD-10-CM | POA: Diagnosis present

## 2016-07-29 ENCOUNTER — Ambulatory Visit: Payer: Medicare Other | Admitting: Surgery

## 2016-07-29 ENCOUNTER — Encounter: Payer: Medicare Other | Admitting: Surgery

## 2016-07-30 ENCOUNTER — Encounter: Payer: Medicare Other | Admitting: Surgery

## 2016-07-31 ENCOUNTER — Ambulatory Visit (INDEPENDENT_AMBULATORY_CARE_PROVIDER_SITE_OTHER): Payer: Medicare Other | Admitting: Surgery

## 2016-07-31 ENCOUNTER — Encounter: Payer: Self-pay | Admitting: Surgery

## 2016-07-31 VITALS — BP 164/97 | HR 60 | Temp 98.6°F | Ht 67.5 in

## 2016-07-31 DIAGNOSIS — K8020 Calculus of gallbladder without cholecystitis without obstruction: Secondary | ICD-10-CM

## 2016-07-31 NOTE — Patient Instructions (Signed)
Please call our office if you have questions or concerns.   

## 2016-07-31 NOTE — Progress Notes (Signed)
Outpatient Surgical Follow Up  07/31/2016  Clayton Grebernest J Town Jr. is an 65 y.o. male.   Chief Complaint  Patient presents with  . Follow-up    Cholelithiasis    HPI: Robotic cholelithiasis and a very debilitated gentleman with significant back issues and deconditioning and decreased mobility. Last time I saw him I wanted to repeat an ultrasound and liver enzymes. Ultrasound ( personally reviewed) still shows evidence of cholelithiasis no evidence of overt cholecystitis or biliary obstruction. His CMP was unremarkable. More importantly his symptoms have reached. Discussed with him in detail about the options and he still wishes to wait and not undergo any surgical intervention  Past Medical History:  Diagnosis Date  . Arthritis   . CKD stage 3 due to type 2 diabetes mellitus (HCC)   . Depression   . Diabetes mellitus without complication (HCC)   . GERD (gastroesophageal reflux disease)   . Hypothyroid   . Vitamin D deficiency     Past Surgical History:  Procedure Laterality Date  . THYROIDECTOMY, PARTIAL      Family History  Problem Relation Age of Onset  . Diabetes Mother   . Cancer Father     Social History:  reports that he has never smoked. He has never used smokeless tobacco. He reports that he does not drink alcohol or use drugs.  Allergies:  Allergies  Allergen Reactions  . Shellfish Allergy     Medications reviewed.    ROS Full ROS and is otherwise negative other than stated in the history of present illness. He actually states that he is strength is better and a history of working with physical therapy.   BP (!) 164/97   Pulse 60   Temp 98.6 F (37 C) (Oral)   Ht 5' 7.5" (1.715 m)   Physical Exam  Constitutional: He is oriented to person, place, and time and well-developed, well-nourished, and in no distress. No distress.  Eyes: Left eye exhibits no discharge. No scleral icterus.  Neck: Normal range of motion. No tracheal deviation present. No  thyromegaly present.  Pulmonary/Chest: Effort normal. No stridor. No respiratory distress.  Abdominal: Soft. He exhibits no distension. There is no tenderness. There is no rebound and no guarding.  Musculoskeletal: He exhibits edema.  Multiple sup ulceration on bilateral LE. No infection or abscess  Neurological: He is alert and oriented to person, place, and time. GCS score is 15.  Skin: Skin is dry. He is not diaphoretic.  Psychiatric: Mood, memory, affect and judgment normal.  Nursing note and vitals reviewed.      No results found for this or any previous visit (from the past 48 hour(s)). No results found.  Assessment/Plan: Symptomatic cholelithiasis in a debilitated patient. Again discussion was held regarding laparoscopic cholecystectomy. The patient is adamant that he does not want any surgical correction at this time. Discussed with him about the potential risk and he does understand in the head he did fairly has significant risk secondary to his failure to thrive and debility. I will see him on a when necessary basis.   Sterling Bigiego Pabon, MD FACS General Surgeon  07/31/2016,11:46 AM

## 2016-08-05 ENCOUNTER — Encounter: Payer: Self-pay | Admitting: Family Medicine

## 2016-08-05 ENCOUNTER — Ambulatory Visit (INDEPENDENT_AMBULATORY_CARE_PROVIDER_SITE_OTHER): Payer: Medicare Other | Admitting: Family Medicine

## 2016-08-05 VITALS — BP 123/71 | HR 77 | Temp 99.0°F | Resp 16 | Ht 67.5 in

## 2016-08-05 DIAGNOSIS — R7989 Other specified abnormal findings of blood chemistry: Secondary | ICD-10-CM

## 2016-08-05 DIAGNOSIS — Z1159 Encounter for screening for other viral diseases: Secondary | ICD-10-CM | POA: Diagnosis not present

## 2016-08-05 DIAGNOSIS — I10 Essential (primary) hypertension: Secondary | ICD-10-CM

## 2016-08-05 DIAGNOSIS — I872 Venous insufficiency (chronic) (peripheral): Secondary | ICD-10-CM | POA: Diagnosis not present

## 2016-08-05 DIAGNOSIS — E890 Postprocedural hypothyroidism: Secondary | ICD-10-CM

## 2016-08-05 DIAGNOSIS — E1142 Type 2 diabetes mellitus with diabetic polyneuropathy: Secondary | ICD-10-CM | POA: Diagnosis not present

## 2016-08-05 DIAGNOSIS — Z13220 Encounter for screening for lipoid disorders: Secondary | ICD-10-CM

## 2016-08-05 LAB — POCT GLYCOSYLATED HEMOGLOBIN (HGB A1C): Hemoglobin A1C: 5.8

## 2016-08-05 MED ORDER — LISINOPRIL 10 MG PO TABS: 10.0000 mg | ORAL_TABLET | Freq: Every day | ORAL | 11 refills | Status: DC

## 2016-08-05 MED ORDER — METFORMIN HCL 500 MG PO TABS: 500.0000 mg | ORAL_TABLET | Freq: Two times a day (BID) | ORAL | 11 refills | Status: DC

## 2016-08-05 NOTE — Assessment & Plan Note (Signed)
Stable, remains well controlled A1c 5.8 (unchanged 1 yr) Complicated by DM neuropathy, also with chronic venous insufficiency and poor circulation  Plan: 1. Continue Metformin 500mg  BID - refilled today, advised patient he could consider decrease down to 500mg  daily, but he opts to continue this dose for now. 2. Given dx of DM, discussed inc ASCVD risk, offered recommendation to start statin therapy, he will consider this, and notify office, also will check lipids 3. Continue ASA, ACEi 4. DM foot exam today 5. UTD on opthalmology, follow-up yearly 6. Recommend booster pneumovax >age 65, declined today, will schedule nurse visit for this with next blood draw 7. Follow-up 1 year for A1c, annual physical - also check future labs chemistry, lipids, review in interval

## 2016-08-05 NOTE — Assessment & Plan Note (Signed)
Stable, chronic bilateral lower extremities, history of some chronic healing wounds on lower ext - Taking Lasix 20mg  qod, advised to only take if needed, keep legs active with movement and elevation for edema

## 2016-08-05 NOTE — Assessment & Plan Note (Signed)
Recent Cr increase from 0.7 to 1.14, 1 month ago, in hospital with biliary colic, not seem to be attributed to NSAIDs or other etiology, possible dehydration - Re-check future BMET for Cr trend. Concern for mild CKD in setting of chronic HTN, DM, however seems well controlled - Advised avoid NSAIDs, improve hydration less soda

## 2016-08-05 NOTE — Assessment & Plan Note (Signed)
Stable on levothyroxine daily Check future TSH, adjust dose as needed yearly

## 2016-08-05 NOTE — Progress Notes (Signed)
Subjective:    Patient ID: Clayton GrebeErnest J Digman Jr., male    DOB: 07/13/1951, 65 y.o.   MRN: [id number]  Clayton Grebernest J Prust Jr. is a 65 y.o. male presenting on 08/05/2016 for Follow-up (pt was seen year ago 07/2015 and recommoneded to be seen by [facility] but now he moved to apartment needs follow up)   HPI   CHRONIC DM, Type 2: Reports no concerns, 1 year since last A1c 5.8. Today 5.8 again. CBGs: Avg low 100s, Low 98, High < 200. Checks CBGs daily Meds: Metformin 500mg  BID Reports good compliance. Tolerating well w/o side-effects Currently on ACEi Lifestyle: Diet (Improved diabetic diet, low carbs, 3 regular meals, reduced sweets, diet sodas and water) / Exercise (Limited due to poor ambulation with chronic back and leg injury from old lifting injury with work in [employer], he is mostly wheelchair bound but can do transfers out of chair by himself to bed, toilet, and chair. He does regular seated exercises with weights and leg lifting frequently, to avoid being sedentary) - Followed by Ophthalmology, last visit 01/2016, reports no DM retinopathy Denies hypoglycemia, polyuria, visual changes, numbness or tingling.  CHRONIC HTN: Reports doing well. Checks BP at home without concern. Current Meds - Lisinopril 10mg  daily, Lasix 20mg  qod   Reports good compliance, took meds today. Tolerating well, w/o complaints. Denies CP, dyspnea, HA, edema, dizziness / lightheadedness  CHOLESTEROL SCREENING: - Reports prior history of some elevated cholesterol, last checked >1 year ago with stable results LDL < 100. Has never been on statin therapy or other cholesterol medications.  HYPOTHYROIDISM, Acquire s/p thyroidectomy - Continues on Levothyroxine 150mcg daily, last TSH checked 07/2015 within normal range - Due for TSH check  Chronic Peripheral Edema, Venous insufficiency - Reports chronic history of some leg swelling, has not been diagnosed with cardiac problem or heart failure, or other cause of  swelling. Has varicose veins, admits sedentary wheelchair bound, does frequent leg lifting for activity  Health Maintenance: - Due for pneumonia vaccine (diabetic patient, >age 65), he had received pneumovax at age in 2011, now due for booster above age 65. - Due for TDap, declined - Due for Hep C screening, agrees to get with next blood work  Social History  Substance Use Topics  . Smoking status: Never Smoker  . Smokeless tobacco: Never Used  . Alcohol use No    Review of Systems Per HPI unless specifically indicated above     Objective:    BP 123/71   Pulse 77   Temp 99 F (37.2 C) (Oral)   Resp 16   Ht 5' 7.5" (1.715 m)   Wt Readings from Last 3 Encounters:  06/21/16 264 lb 14.4 oz (120.2 kg)  07/23/15 234 lb 3.2 oz (106.2 kg)  03/28/15 236 lb 6.4 oz (107.2 kg)    Physical Exam  Constitutional: He appears well-developed and well-nourished. No distress.  Chronically ill appearing but currently, comfortable, cooperative. Sitting in wheelchair.  HENT:  Head: Normocephalic and atraumatic.  Mouth/Throat: Oropharynx is clear and moist.  Eyes: Conjunctivae are normal.  Neck: Normal range of motion. Neck supple. No thyromegaly present.  Cardiovascular: Normal rate, regular rhythm, normal heart sounds and intact distal pulses.   No murmur heard. Pulmonary/Chest: Effort normal and breath sounds normal. No respiratory distress. He has no wheezes. He has no rales.  Musculoskeletal: He exhibits edema (Mild non pitting +1 edema bilateral lower extremity with some varicose veins R>L and some chronic skin changes with hyperpigementation). He  exhibits no tenderness.  Lymphadenopathy:    He has no cervical adenopathy.  Neurological: He is alert.  Skin: Skin is warm and dry. He is not diaphoretic.  Healing skin abrasions and chronic skin changes Left anterior leg, without open ulceration, some healthy healed pink granulation tissue.  Psychiatric: He has a normal mood and affect. His  behavior is normal.  Nursing note and vitals reviewed.    DM FOOT EXAM -Bilateral feet with dry skin with some flaking, some thickened overgrown toenails, also some callus formation. No ulceration, erythema or other deformity. 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 Continue Metformin 500mg  BID - refilled today, advised patient he could consider decrease down to 500mg  daily, but he opts to continue this dose for now. 2. Given dx of DM, discussed inc ASCVD risk, offered recommendation to start statin therapy, he will consider this, and notify office, also will check lipids 3. Continue ASA, ACEi 4. DM foot exam today 5. UTD on opthalmology, follow-up yearly 6. Recommend booster pneumovax >age 89, declined today, will schedule nurse visit for this with next blood draw 7. Follow-up 1 year for A1c, annual physical - also check future labs chemistry, lipids, review in interval      Relevant Medications   metFORMIN (GLUCOPHAGE) 500 MG tablet   lisinopril (PRINIVIL,ZESTRIL) 10 MG tablet   Other Relevant Orders   POCT HgB A1C (Completed)   BASIC METABOLIC PANEL WITH GFR   Lipid panel   Post-operative hypothyroidism    Stable on levothyroxine 150mcg daily Check future TSH, adjust dose as needed yearly      Relevant Orders   TSH   Essential hypertension    Well controlled HTN, does check BP at home as well Recent elevated Cr from 0.7 to 1.14, possible AKI in hospital recently for biliary colic Otherwise no complication  Plan: 1.  Refilled Lisinopril 10mg  daily, prior taking 1.5 tabs for 15mg  in past, but admits to taking one daily now 2. Check future chemistry BMET follow-up Cr 3. Continue wheelchair exercises, low carb, low sodium diet 4. Follow-up yearly, sooner if needed, return precautions      Relevant Medications   lisinopril (PRINIVIL,ZESTRIL) 10 MG tablet   Other Relevant Orders   BASIC METABOLIC PANEL WITH GFR   Lipid panel   Elevated serum creatinine    Recent Cr increase from 0.7 to 1.14, 1 month ago, in hospital with biliary colic, not seem to be attributed to NSAIDs or other etiology, possible dehydration - Re-check future BMET for Cr trend. Concern for mild CKD in setting of chronic HTN, DM, however seems well controlled - Advised avoid NSAIDs, improve hydration less soda      Relevant Orders   BASIC METABOLIC PANEL WITH GFR   Chronic venous insufficiency    Stable, chronic bilateral lower extremities, history of some chronic healing wounds on lower ext - Taking Lasix 20mg  qod, advised to only take if needed, keep legs active with movement and elevation for edema  Relevant Medications   lisinopril (PRINIVIL,ZESTRIL) 10 MG tablet    Other Visit Diagnoses    Screening cholesterol level       Relevant Orders   Lipid panel   Need for hepatitis C screening test       Relevant Orders   Hepatitis C antibody      Meds ordered this encounter  Medications  . ranitidine (ZANTAC) 150 MG tablet    Sig: Take 150 mg by mouth at bedtime.  . metFORMIN (GLUCOPHAGE) 500 MG tablet    Sig: Take 1 tablet (500 mg total) by mouth 2 (two) times daily with a meal.    Dispense:  60 tablet    Refill:  11  . lisinopril (PRINIVIL,ZESTRIL) 10 MG tablet    Sig: Take 1 tablet (10 mg total) by mouth daily.    Dispense:  30 tablet    Refill:  11      Follow up plan: Return in about 1 year (around 08/05/2017) for Annual physical, DM A1c.  Saralyn Pilar, DO [facility] 08/05/2016, 5:51 PM

## 2016-08-05 NOTE — Patient Instructions (Addendum)
Thank you for coming in to clinic today.  1. For Diabetes, doing well A1c 5.8 (stable in 1 year) 2. Continue Metformin 500mg  twice a day, refills sent to pharmacy 3. BP is well controlled, refilled Lisinopril 10mg  daily 4. For the swelling in legs, you can continue to take Lasix every OTHER day, or ONLY as needed for swelling, as this medication can HARM your kidneys if you take it too often  Try to drink more water to hydrate better, and less soda.  Keep doing exercises for legs  We will need to check labs soon, You will be due for FASTING BLOOD WORK (no food or drink after midnight before, only water or coffee without cream/sugar on the morning of)  - Please go ahead and schedule a "Lab Only" visit in the morning at the clinic for lab draw in next few weeks to 1-2 months  Check Cholesterol, Thyroid hormone, and Kidney function - will notify you with results.  I do think you would benefit from one of those Cholesterol medications such as Atorvastatin (Lipitor), Simvastatin (Zocor), Rosuvastatin (Crestor), look into these and let me know if you would like to start one, I do recommend it to reduce risk of heart attack or stroke.  Due for Pneumoniva vaccine booster Pneumovax 23  Please schedule a Nurse Only visit (same day to get your labs drawn, to get Pneumonia vaccine)  Please schedule a follow-up appointment with Dr. Althea CharonKaramalegos in 12 months for Annual Physical (Diabetes A1c)  If you have any other questions or concerns, please feel free to call the clinic or send a message through MyChart. You may also schedule an earlier appointment if necessary.  Saralyn PilarAlexander Mahamadou Weltz, DO [facility]

## 2016-08-05 NOTE — Assessment & Plan Note (Signed)
Well controlled HTN, does check BP at home as well Recent elevated Cr from 0.7 to 1.14, possible AKI in hospital recently for biliary colic Otherwise no complication  Plan: 1. Refilled Lisinopril 10mg  daily, prior taking 1.5 tabs for 15mg  in past, but admits to taking one daily now 2. Check future chemistry BMET follow-up Cr 3. Continue wheelchair exercises, low carb, low sodium diet 4. Follow-up yearly, sooner if needed, return precautions

## 2016-08-12 ENCOUNTER — Telehealth: Payer: Self-pay | Admitting: Family Medicine

## 2016-08-12 DIAGNOSIS — E1142 Type 2 diabetes mellitus with diabetic polyneuropathy: Secondary | ICD-10-CM

## 2016-08-12 DIAGNOSIS — F32A Depression, unspecified: Secondary | ICD-10-CM

## 2016-08-12 DIAGNOSIS — K219 Gastro-esophageal reflux disease without esophagitis: Secondary | ICD-10-CM | POA: Insufficient documentation

## 2016-08-12 DIAGNOSIS — I10 Essential (primary) hypertension: Secondary | ICD-10-CM

## 2016-08-12 DIAGNOSIS — F329 Major depressive disorder, single episode, unspecified: Secondary | ICD-10-CM

## 2016-08-12 MED ORDER — METFORMIN HCL 500 MG PO TABS: 500.0000 mg | ORAL_TABLET | Freq: Two times a day (BID) | ORAL | 11 refills | Status: DC

## 2016-08-12 MED ORDER — LISINOPRIL 10 MG PO TABS
10.0000 mg | ORAL_TABLET | Freq: Every day | ORAL | 11 refills | Status: DC
Start: 2016-08-12 — End: 2017-05-20

## 2016-08-12 MED ORDER — PAROXETINE HCL 30 MG PO TABS: 30.0000 mg | ORAL_TABLET | Freq: Every day | ORAL | 11 refills | Status: DC

## 2016-08-12 MED ORDER — RANITIDINE HCL 150 MG PO TABS: 150.0000 mg | ORAL_TABLET | Freq: Every day | ORAL | 11 refills | Status: DC

## 2016-08-12 NOTE — Telephone Encounter (Signed)
Clayton SieveWillie  Green [id number] Pt. Care taker called states that  All pt  Medication  Need to be called in to   [facility]  [address] will deliver to  Pt house

## 2016-08-12 NOTE — Telephone Encounter (Signed)
Medications sent to pharmacy. (Metformin and Lisinopril). [facility]. Patient also needs refills on Paroxetine and Ranitidine.

## 2016-08-12 NOTE — Telephone Encounter (Signed)
Refills for Paroxetine 30mg  daily #30 pills +11 refills for Depression and Ranitidine 150mg  nightly #30 +11 [facility]  Saralyn PilarAlexander Karamalegos, DO [facility] 08/12/2016, 12:47 PM

## 2016-08-24 ENCOUNTER — Telehealth: Payer: Self-pay | Admitting: Family Medicine

## 2016-08-24 DIAGNOSIS — M7989 Other specified soft tissue disorders: Secondary | ICD-10-CM

## 2016-08-24 MED ORDER — FUROSEMIDE 20 MG PO TABS: 20.0000 mg | ORAL_TABLET | ORAL | 5 refills | Status: DC

## 2016-08-24 MED ORDER — CHOLECALCIFEROL 50 MCG (2000 UT) PO CAPS: 1.0000 | ORAL_CAPSULE | Freq: Every day | ORAL | 3 refills | Status: AC

## 2016-08-24 NOTE — Telephone Encounter (Signed)
Pt needs refills on furosemide and Vitamin D sent to [facility].  His call back number is [phone]

## 2016-08-24 NOTE — Telephone Encounter (Signed)
[facility], vitamin D3 2,000 unit capsule daily for maintenance therapy #90 with 3 refills for 1 year supply, and Furosemide 20mg  take one every other day for swelling, #30, with +5 refills.  Saralyn PilarAlexander Karamalegos, DO [facility] 08/24/2016, 3:54 PM

## 2016-09-08 ENCOUNTER — Ambulatory Visit (INDEPENDENT_AMBULATORY_CARE_PROVIDER_SITE_OTHER): Payer: Medicare Other

## 2016-09-08 ENCOUNTER — Other Ambulatory Visit: Payer: Medicare Other

## 2016-09-08 DIAGNOSIS — Z1159 Encounter for screening for other viral diseases: Secondary | ICD-10-CM

## 2016-09-08 DIAGNOSIS — Z23 Encounter for immunization: Secondary | ICD-10-CM

## 2016-09-08 DIAGNOSIS — E785 Hyperlipidemia, unspecified: Secondary | ICD-10-CM

## 2016-09-08 DIAGNOSIS — E890 Postprocedural hypothyroidism: Secondary | ICD-10-CM

## 2016-09-08 DIAGNOSIS — R7989 Other specified abnormal findings of blood chemistry: Secondary | ICD-10-CM

## 2016-09-08 DIAGNOSIS — E1142 Type 2 diabetes mellitus with diabetic polyneuropathy: Secondary | ICD-10-CM

## 2016-09-08 DIAGNOSIS — I10 Essential (primary) hypertension: Secondary | ICD-10-CM

## 2016-09-08 DIAGNOSIS — Z13220 Encounter for screening for lipoid disorders: Secondary | ICD-10-CM

## 2016-09-08 DIAGNOSIS — E1169 Type 2 diabetes mellitus with other specified complication: Secondary | ICD-10-CM

## 2016-09-09 LAB — BASIC METABOLIC PANEL WITH GFR
BUN: 22 mg/dL (ref 7–25)
CO2: 27 mmol/L (ref 20–31)
Calcium: 9.7 mg/dL (ref 8.6–10.3)
Chloride: 99 mmol/L (ref 98–110)
Creat: 1.07 mg/dL (ref 0.70–1.25)
GFR, EST AFRICAN AMERICAN: 83 mL/min (ref >=60)
GFR, EST NON AFRICAN AMERICAN: 72 mL/min (ref >=60)
Glucose, Bld: 108 mg/dL — ABNORMAL HIGH (ref 65–99)
POTASSIUM: 4.1 mmol/L (ref 3.5–5.3)
Sodium: 140 mmol/L (ref 135–146)

## 2016-09-09 LAB — LIPID PANEL
CHOL/HDL RATIO: 3.4 ratio (ref <5.0)
CHOLESTEROL: 170 mg/dL (ref <200)
HDL: 50 mg/dL (ref >40)
LDL Cholesterol: 88 mg/dL (ref <100)
TRIGLYCERIDES: 158 mg/dL — AB (ref <150)
VLDL: 32 mg/dL — ABNORMAL HIGH (ref <30)

## 2016-09-09 LAB — HEPATITIS C ANTIBODY: HCV Ab: NEGATIVE

## 2016-09-09 LAB — TSH: TSH: 7.79 mIU/L — ABNORMAL HIGH (ref 0.40–4.50)

## 2016-09-10 ENCOUNTER — Telehealth: Payer: Self-pay | Admitting: Family Medicine

## 2016-09-10 NOTE — Telephone Encounter (Signed)
Patient does not have MyChart access. Therefore not able to release results. I have received fax on patient requiring additional information for his Personal Care Services, therefore have been waiting for time to contact patient, so the results were delayed in reaching patient. I attempted to call him twice this afternoon, not answering, left voicemail. He called the clinic back, and still will not pick up when I call again.  If he calls back, here are lab results.  1. Chemistry - Normal including kidney function. Blood sugar very mildly elevated. Not due for next A1c.  2. Routine Hepatitis C Screening - Negative  3. Lipids - Mildly abnormal elevated Triglycerides, otherwise cholesterol numbers look good. He is still candidate for starting statin medication as previously discussed.  4. TSH - Elevated up to 7.79. He is taking Levothyroxine 150mcg daily. Please confirm that he is taking this dose every day, or if anything has changed. He may benefit from higher dose, we could do TWO of the 88mcg pills, for total dose of 176mcg once daily, instead of the 150 pill. Let me know if agreeable to this change.  Lastly, I have paperwork for his Personal Care Services that I most likely need to speak with him about. I will try to contact him again tomorrow 09/11/16.

## 2016-09-10 NOTE — Telephone Encounter (Signed)
Pt called regarding blood test result.

## 2016-09-11 ENCOUNTER — Encounter: Payer: Self-pay | Admitting: Family Medicine

## 2016-09-11 DIAGNOSIS — M545 Low back pain: Secondary | ICD-10-CM

## 2016-09-11 DIAGNOSIS — M47816 Spondylosis without myelopathy or radiculopathy, lumbar region: Secondary | ICD-10-CM | POA: Insufficient documentation

## 2016-09-11 DIAGNOSIS — E785 Hyperlipidemia, unspecified: Secondary | ICD-10-CM

## 2016-09-11 DIAGNOSIS — G8929 Other chronic pain: Secondary | ICD-10-CM | POA: Insufficient documentation

## 2016-09-11 DIAGNOSIS — E1169 Type 2 diabetes mellitus with other specified complication: Secondary | ICD-10-CM | POA: Insufficient documentation

## 2016-09-11 MED ORDER — ATORVASTATIN CALCIUM 20 MG PO TABS: 20.0000 mg | ORAL_TABLET | Freq: Every day | ORAL | 5 refills | Status: DC

## 2016-09-11 MED ORDER — LEVOTHYROXINE SODIUM 88 MCG PO TABS: 176.0000 ug | ORAL_TABLET | Freq: Every day | ORAL | 3 refills | Status: AC

## 2016-09-11 NOTE — Addendum Note (Signed)
Addended by: Smitty CordsKARAMALEGOS, Khye Hochstetler J on: 09/11/2016 09:40 AM   Modules accepted: Orders

## 2016-09-11 NOTE — Progress Notes (Addendum)
FORM COMPLETION - Personal Care Services [facility](PCS)  Indication: New Request Date of Request: 09/11/16 Initial start date of services: TBD Company - [employer] [facility]  Date of last office visit: 08/05/16  Medical Diagnosis List - ICD10 1. Type 2 Diabetes with Neuropathy - E11.42 - (Date of onset: 08/2009) 2. 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 Chronic Low Back Pain - M54.5, G80.29 (08/2006)  Clinical judgement, ADL limitations are: Chronic and stable Medically stable - Yes 24 hour caregiver availability required - No  Optional - No increased level of supervision required.  CHANGE OF MEDICAL STATUS: N/A  Additional Clinical Information: - Patient reports he has difficulty with reaching up at moderate heights, difficulty with cleaning and performing ADLs, often limited to wheelchair, able to do transfers and very short distances by himself.  Forms completed, signed, dated 09/11/16, and [facility] arrived to pick up form earlier today, copy to be scanned into chart.  Saralyn PilarAlexander Terisa Belardo, DO [facility] 09/11/2016, 5:00 PM

## 2016-09-21 ENCOUNTER — Telehealth: Payer: Self-pay | Admitting: Family Medicine

## 2016-09-21 DIAGNOSIS — M7989 Other specified soft tissue disorders: Secondary | ICD-10-CM

## 2016-09-21 MED ORDER — ASPIRIN 81 MG PO TABS: 81.0000 mg | ORAL_TABLET | Freq: Every day | ORAL | 11 refills | Status: AC

## 2016-09-21 MED ORDER — FUROSEMIDE 20 MG PO TABS: 20.0000 mg | ORAL_TABLET | ORAL | 5 refills | Status: DC

## 2016-09-21 NOTE — Telephone Encounter (Signed)
Refilled Aspirin 81mg  daily and [facility]urosemide 20mg  [facility] Called patient, he states he was taking Furosemide daily, but now will switch to QOD, it is for venous insufficiency and did not have many problems.  Also, described some generalized joint pains after starting Atorvastatin 20mg  daily in morning about 1.5 weeks ago, this was new start statin due to DM2 and age as indicated for reduce ASCVD risk. He has stopped taking it now and feels much better with resolved joint pains. I advised that he wait 1 more week with statin out of system, and then starting next Monday 09/28/16 cut 20mg  tabs in half and take Atorvastatin 10mg  (half pill) at NIGHT give it a week if tolerated continue this dose, if myalgias joint pains then stop again, wait a week until recover then can take half tab 10mg  night every other day or up to 3 times a week to see, if still myalgias then will switch to likely Crestor / Resovustatin.  Saralyn PilarAlexander Royale Lennartz, DO [facility] 09/21/2016, 10:12 AM

## 2016-09-21 NOTE — Telephone Encounter (Signed)
Pt needs refills on aspirin and furosemide sent to [facility].  He thinks the atorvastatin might be causing joint pain.  His call back number is [phone].

## 2016-09-28 ENCOUNTER — Telehealth: Payer: Self-pay | Admitting: Family Medicine

## 2016-09-28 NOTE — Telephone Encounter (Signed)
[facility] called regarding pt requesting trazodone and as per Dr. Kirtland BouchardK verbal was given for trazodone 50 mg once at bed time #30 with 11 refill.

## 2017-03-01 ENCOUNTER — Emergency Department: Payer: Medicare Other

## 2017-03-01 ENCOUNTER — Inpatient Hospital Stay
Admission: EM | Admit: 2017-03-01 | Discharge: 2017-03-04 | DRG: 311 | Disposition: A | Discharge status: Skilled Nursing Facility | Payer: Medicare Other | Attending: Internal Medicine | Admitting: Internal Medicine

## 2017-03-01 DIAGNOSIS — E890 Postprocedural hypothyroidism: Secondary | ICD-10-CM | POA: Diagnosis present

## 2017-03-01 DIAGNOSIS — I248 Other forms of acute ischemic heart disease: Secondary | ICD-10-CM | POA: Diagnosis present

## 2017-03-01 DIAGNOSIS — Z833 Family history of diabetes mellitus: Secondary | ICD-10-CM | POA: Diagnosis not present

## 2017-03-01 DIAGNOSIS — W010XXA Fall on same level from slipping, tripping and stumbling without subsequent striking against object, initial encounter: Secondary | ICD-10-CM | POA: Diagnosis present

## 2017-03-01 DIAGNOSIS — E785 Hyperlipidemia, unspecified: Secondary | ICD-10-CM | POA: Diagnosis present

## 2017-03-01 DIAGNOSIS — Z79899 Other long term (current) drug therapy: Secondary | ICD-10-CM | POA: Diagnosis not present

## 2017-03-01 DIAGNOSIS — T148XXA Other injury of unspecified body region, initial encounter: Secondary | ICD-10-CM

## 2017-03-01 DIAGNOSIS — E860 Dehydration: Secondary | ICD-10-CM | POA: Diagnosis present

## 2017-03-01 DIAGNOSIS — Z91013 Allergy to seafood: Secondary | ICD-10-CM

## 2017-03-01 DIAGNOSIS — K219 Gastro-esophageal reflux disease without esophagitis: Secondary | ICD-10-CM | POA: Diagnosis present

## 2017-03-01 DIAGNOSIS — R32 Unspecified urinary incontinence: Secondary | ICD-10-CM | POA: Diagnosis present

## 2017-03-01 DIAGNOSIS — Y92019 Unspecified place in single-family (private) house as the place of occurrence of the external cause: Secondary | ICD-10-CM | POA: Diagnosis not present

## 2017-03-01 DIAGNOSIS — I10 Essential (primary) hypertension: Secondary | ICD-10-CM | POA: Diagnosis present

## 2017-03-01 DIAGNOSIS — F329 Major depressive disorder, single episode, unspecified: Secondary | ICD-10-CM | POA: Diagnosis present

## 2017-03-01 DIAGNOSIS — E119 Type 2 diabetes mellitus without complications: Secondary | ICD-10-CM | POA: Diagnosis present

## 2017-03-01 DIAGNOSIS — N179 Acute kidney failure, unspecified: Secondary | ICD-10-CM | POA: Diagnosis present

## 2017-03-01 DIAGNOSIS — E559 Vitamin D deficiency, unspecified: Secondary | ICD-10-CM | POA: Diagnosis present

## 2017-03-01 DIAGNOSIS — M1990 Unspecified osteoarthritis, unspecified site: Secondary | ICD-10-CM | POA: Diagnosis present

## 2017-03-01 DIAGNOSIS — T796XXA Traumatic ischemia of muscle, initial encounter: Secondary | ICD-10-CM | POA: Diagnosis present

## 2017-03-01 DIAGNOSIS — Z7984 Long term (current) use of oral hypoglycemic drugs: Secondary | ICD-10-CM

## 2017-03-01 DIAGNOSIS — I214 Non-ST elevation (NSTEMI) myocardial infarction: Secondary | ICD-10-CM

## 2017-03-01 DIAGNOSIS — Z7982 Long term (current) use of aspirin: Secondary | ICD-10-CM | POA: Diagnosis not present

## 2017-03-01 DIAGNOSIS — M25559 Pain in unspecified hip: Secondary | ICD-10-CM

## 2017-03-01 DIAGNOSIS — R079 Chest pain, unspecified: Secondary | ICD-10-CM | POA: Diagnosis present

## 2017-03-01 HISTORY — DX: Hypothyroidism, unspecified: E03.9

## 2017-03-01 HISTORY — DX: Essential (primary) hypertension: I10

## 2017-03-01 HISTORY — DX: Hyperlipidemia, unspecified: E78.5

## 2017-03-01 LAB — HEPATIC FUNCTION PANEL
ALBUMIN: 4.1 g/dL (ref 3.5–5.0)
ALK PHOS: 137 U/L — AB (ref 38–126)
ALT: 25 U/L (ref 17–63)
AST: 89 U/L — ABNORMAL HIGH (ref 15–41)
BILIRUBIN TOTAL: 1.6 mg/dL — AB (ref 0.3–1.2)
Bilirubin, Direct: 0.2 mg/dL (ref 0.1–0.5)
Indirect Bilirubin: 1.4 mg/dL — ABNORMAL HIGH (ref 0.3–0.9)
Total Protein: 8.3 g/dL — ABNORMAL HIGH (ref 6.5–8.1)

## 2017-03-01 LAB — PROTIME-INR
INR: 1.26
PROTHROMBIN TIME: 15.9 s — AB (ref 11.4–15.2)

## 2017-03-01 LAB — CBC WITH DIFFERENTIAL/PLATELET
BASOS ABS: 0.1 10*3/uL (ref 0–0.1)
Basophils Relative: 0 %
EOS PCT: 0 %
Eosinophils Absolute: 0 10*3/uL (ref 0–0.7)
HCT: 41.5 % (ref 40.0–52.0)
Hemoglobin: 13.9 g/dL (ref 13.0–18.0)
Lymphocytes Relative: 4 %
Lymphs Abs: 0.6 10*3/uL — ABNORMAL LOW (ref 1.0–3.6)
MCH: 28.9 pg (ref 26.0–34.0)
MCHC: 33.4 g/dL (ref 32.0–36.0)
MCV: 86.4 fL (ref 80.0–100.0)
MONO ABS: 0.8 10*3/uL (ref 0.2–1.0)
Monocytes Relative: 5 %
Neutro Abs: 15.5 10*3/uL — ABNORMAL HIGH (ref 1.4–6.5)
Neutrophils Relative %: 91 %
PLATELETS: 204 10*3/uL (ref 150–440)
RBC: 4.8 MIL/uL (ref 4.40–5.90)
RDW: 15.3 % — AB (ref 11.5–14.5)
WBC: 17 10*3/uL — ABNORMAL HIGH (ref 3.8–10.6)

## 2017-03-01 LAB — TROPONIN I
TROPONIN I: 3.5 ng/mL — AB (ref <0.03)
Troponin I: 5.67 ng/mL (ref <0.03)
Troponin I: 6.88 ng/mL (ref <0.03)

## 2017-03-01 LAB — CK: Total CK: 4083 U/L — ABNORMAL HIGH (ref 49–397)

## 2017-03-01 LAB — URINALYSIS, COMPLETE (UACMP) WITH MICROSCOPIC
Bilirubin Urine: NEGATIVE
Glucose, UA: NEGATIVE mg/dL
Ketones, ur: 20 mg/dL — AB
LEUKOCYTES UA: NEGATIVE
Nitrite: NEGATIVE
PH: 5 (ref 5.0–8.0)
Protein, ur: 100 mg/dL — AB
SPECIFIC GRAVITY, URINE: 1.019 (ref 1.005–1.030)

## 2017-03-01 LAB — BASIC METABOLIC PANEL
Anion gap: 17 — ABNORMAL HIGH (ref 5–15)
BUN: 42 mg/dL — AB (ref 6–20)
CHLORIDE: 99 mmol/L — AB (ref 101–111)
CO2: 21 mmol/L — AB (ref 22–32)
CREATININE: 1.87 mg/dL — AB (ref 0.61–1.24)
Calcium: 9.4 mg/dL (ref 8.9–10.3)
GFR calc non Af Amer: 36 mL/min — ABNORMAL LOW (ref >60)
GFR, EST AFRICAN AMERICAN: 42 mL/min — AB (ref >60)
GLUCOSE: 147 mg/dL — AB (ref 65–99)
Potassium: 4.6 mmol/L (ref 3.5–5.1)
Sodium: 137 mmol/L (ref 135–145)

## 2017-03-01 LAB — GLUCOSE, CAPILLARY: Glucose-Capillary: 117 mg/dL — ABNORMAL HIGH (ref 65–99)

## 2017-03-01 LAB — LIPID PANEL
CHOLESTEROL: 169 mg/dL (ref 0–200)
HDL: 51 mg/dL (ref >40)
LDL Cholesterol: 86 mg/dL (ref 0–99)
TRIGLYCERIDES: 162 mg/dL — AB (ref <150)
Total CHOL/HDL Ratio: 3.3 RATIO
VLDL: 32 mg/dL (ref 0–40)

## 2017-03-01 LAB — APTT: APTT: 27 s (ref 24–36)

## 2017-03-01 MED ORDER — PAROXETINE HCL 20 MG PO TABS
30.0000 mg | ORAL_TABLET | Freq: Every day | ORAL | Status: DC
  Administered 2017-03-02 – 2017-03-04 (×3): 30 mg via ORAL
  Filled 2017-03-01 (×3): qty 2

## 2017-03-01 MED ORDER — HEPARIN (PORCINE) IN NACL 100-0.45 UNIT/ML-% IJ SOLN
1800.0000 [IU]/h | INTRAMUSCULAR | Status: DC
  Administered 2017-03-01: 1100 [IU]/h via INTRAVENOUS
  Administered 2017-03-02: 1300 [IU]/h via INTRAVENOUS
  Administered 2017-03-03: 1800 [IU]/h via INTRAVENOUS
  Filled 2017-03-01 (×4): qty 250

## 2017-03-01 MED ORDER — LEVOTHYROXINE SODIUM 88 MCG PO TABS
176.0000 ug | ORAL_TABLET | Freq: Every day | ORAL | Status: DC
  Administered 2017-03-02 – 2017-03-04 (×3): 176 ug via ORAL
  Filled 2017-03-01 (×3): qty 2

## 2017-03-01 MED ORDER — SODIUM CHLORIDE 0.9 % IV BOLUS (SEPSIS)
2000.0000 mL | Freq: Once | INTRAVENOUS | Status: AC
  Administered 2017-03-01: 2000 mL via INTRAVENOUS

## 2017-03-01 MED ORDER — ASPIRIN EC 325 MG PO TBEC
325.0000 mg | DELAYED_RELEASE_TABLET | Freq: Every day | ORAL | Status: DC
  Administered 2017-03-01: 325 mg via ORAL
  Filled 2017-03-01: qty 1

## 2017-03-01 MED ORDER — SODIUM CHLORIDE 0.9 % IV BOLUS (SEPSIS)
1000.0000 mL | Freq: Once | INTRAVENOUS | Status: AC
  Administered 2017-03-01: 1000 mL via INTRAVENOUS

## 2017-03-01 MED ORDER — ATORVASTATIN CALCIUM 20 MG PO TABS: 20.0000 mg | ORAL_TABLET | Freq: Every day | ORAL | Status: DC

## 2017-03-01 MED ORDER — DOCUSATE SODIUM 100 MG PO CAPS: 100.0000 mg | ORAL_CAPSULE | Freq: Two times a day (BID) | ORAL | Status: DC | PRN

## 2017-03-01 MED ORDER — SODIUM CHLORIDE 0.9 % IV SOLN
INTRAVENOUS | Status: AC
  Administered 2017-03-01 – 2017-03-02 (×3): via INTRAVENOUS

## 2017-03-01 MED ORDER — ASPIRIN 81 MG PO CHEW
324.0000 mg | CHEWABLE_TABLET | Freq: Once | ORAL | Status: AC
  Administered 2017-03-01: 324 mg via ORAL
  Filled 2017-03-01: qty 4

## 2017-03-01 MED ORDER — VITAMIN D3 25 MCG (1000 UNIT) PO TABS
2000.0000 [IU] | ORAL_TABLET | Freq: Every day | ORAL | Status: DC
  Administered 2017-03-02 – 2017-03-04 (×3): 2000 [IU] via ORAL
  Filled 2017-03-01 (×6): qty 2

## 2017-03-01 MED ORDER — TRAZODONE HCL 50 MG PO TABS
50.0000 mg | ORAL_TABLET | Freq: Every day | ORAL | Status: DC
  Administered 2017-03-01 – 2017-03-03 (×3): 50 mg via ORAL
  Filled 2017-03-01 (×3): qty 1

## 2017-03-01 MED ORDER — ACETAMINOPHEN 500 MG PO TABS
500.0000 mg | ORAL_TABLET | Freq: Four times a day (QID) | ORAL | Status: DC | PRN
  Administered 2017-03-01 – 2017-03-02 (×2): 500 mg via ORAL
  Filled 2017-03-01 (×2): qty 1

## 2017-03-01 MED ORDER — FAMOTIDINE 20 MG PO TABS
10.0000 mg | ORAL_TABLET | Freq: Two times a day (BID) | ORAL | Status: DC
  Administered 2017-03-01 – 2017-03-04 (×6): 10 mg via ORAL
  Filled 2017-03-01 (×6): qty 1

## 2017-03-01 MED ORDER — ASPIRIN EC 81 MG PO TBEC
81.0000 mg | DELAYED_RELEASE_TABLET | Freq: Every day | ORAL | Status: DC
  Administered 2017-03-02 – 2017-03-04 (×3): 81 mg via ORAL
  Filled 2017-03-01 (×3): qty 1

## 2017-03-01 MED ORDER — HEPARIN BOLUS VIA INFUSION
4000.0000 [IU] | Freq: Once | INTRAVENOUS | Status: AC
  Administered 2017-03-01: 4000 [IU] via INTRAVENOUS
  Filled 2017-03-01: qty 4000

## 2017-03-01 NOTE — ED Provider Notes (Signed)
[facility] Emergency Department Provider Note  ____________________________________________   First MD Initiated Contact with Patient 03/01/17 1353     (approximate)  I have reviewed the triage vital signs and the nursing notes.   HISTORY  Chief Complaint Fall    HPI Clayton Green. is a 66 y.o. male who comes to the emergency department via EMS after being found down in his home. He says that yesterday he fell unable to get up since. He does not remember actually falling but he said he thinks he may have slipped landing on his bottom. He said that ever since then "I've been stuck late glue to the floor". His neighbor normally checks on him every day in this afternoon when the patient did not respond to the doorbell the neighbor came around the back and saw the patient lying on the ground calling for help. The patient reports mild nonradiating constant left-sided chest pain as well as mild shortness of breath   Past Medical History:  Diagnosis Date  . Acquired hypothyroidism    a. s/p thyroidectomy.  . Arthritis   . Depression   . Diabetes mellitus without complication (HCC)   . GERD (gastroesophageal reflux disease)   . Hyperlipidemia   . Hypertension   . Vitamin D deficiency     Patient Active Problem List   Diagnosis Date Noted  . NSTEMI (non-ST elevated myocardial infarction) (HCC) 03/01/2017  . Hyperlipidemia associated with type 2 diabetes mellitus (HCC) 09/11/2016  . Chronic low back pain 09/11/2016  . Degenerative joint disease (DJD) of lumbar spine 09/11/2016  . GERD (gastroesophageal reflux disease) 08/12/2016  . Chronic venous insufficiency 08/05/2016  . Elevated serum creatinine 08/05/2016  . Biliary colic 06/21/2016  . Cholelithiasis 06/21/2016  . 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 Vitamin D deficiency 03/28/2015  .  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 Essential hypertension 03/28/2015    Past Surgical History:  Procedure Laterality Date  . colonscopy    . THYROIDECTOMY, PARTIAL      Prior to Admission medications   Medication Sig Start Date End Date Taking? Authorizing Provider  aspirin 81 MG tablet Take 1 tablet (81 mg total) by mouth daily. 09/21/16  Yes Smitty Cords, DO  Cholecalciferol 2000 units CAPS Take 1 capsule (2,000 Units total) by mouth daily. 08/24/16  Yes Karamalegos, Netta Neat, DO  furosemide (LASIX) 20 MG tablet Take 1 tablet (20 mg total) by mouth every other day. 09/21/16  Yes Karamalegos, Netta Neat, DO  levothyroxine (SYNTHROID, LEVOTHROID) 88 MCG tablet Take 2 tablets (176 mcg total) by mouth daily before breakfast. 09/11/16  Yes Karamalegos, Netta Neat, DO  lisinopril (PRINIVIL,ZESTRIL) 10 MG tablet Take 1 tablet (10 mg total) by mouth daily. 08/12/16  Yes Karamalegos, Netta Neat, DO  metFORMIN (GLUCOPHAGE) 500 MG tablet Take 1 tablet (500 mg total) by mouth 2 (two) times daily with a meal. 08/12/16  Yes Karamalegos, Alexander J, DO  PARoxetine (PAXIL) 30 MG tablet Take 1 tablet (30 mg total) by mouth daily. 08/12/16  Yes Karamalegos, Netta Neat, DO  ranitidine (ZANTAC) 150 MG tablet Take 1 tablet (150 mg total) by mouth at bedtime. 08/12/16  Yes Karamalegos, Netta Neat, DO  traZODone (DESYREL) 50 MG tablet Take 50 mg by mouth at bedtime.  03/07/15  Yes [provider]  acetaminophen (TYLENOL) 500 MG tablet Take 500 mg by mouth 2 (two) times  daily as needed.    [provider]  atorvastatin (LIPITOR) 20 MG tablet Take 1 tablet (20 mg total) by mouth daily. 09/11/16   Karamalegos, Netta Neat, DO  mineral oil-hydrophilic petrolatum (AQUAPHOR) ointment Apply topically as needed for dry skin. Apply to legs daily. 05/14/15   Janeann Forehand., MD    Allergies Shellfish allergy  Family History  Problem Relation Age of Onset  . Diabetes Mother     . Cancer Father     Social History Social History  Substance Use Topics  . Smoking status: Never Smoker  . Smokeless tobacco: Never Used  . Alcohol use No    Review of Systems Constitutional: No fever/chills Eyes: No visual changes. ENT: No sore throat. Cardiovascular: Positive chest pain. Respiratory: Denies shortness of breath. Gastrointestinal: Positive abdominal pain.  No nausea, no vomiting.  No diarrhea.  No constipation. Genitourinary: Negative for dysuria. Musculoskeletal: Negative for back pain. Skin: Positive for rash. Neurological: Negative for headaches, focal weakness or numbness.   ____________________________________________   PHYSICAL EXAM:  VITAL SIGNS: ED Triage Vitals [03/01/17 1352]  Enc Vitals Group     BP      Pulse      Resp      Temp      Temp src      SpO2      Weight      Height      Head Circumference      Peak Flow      Pain Score 9     Pain Loc      Pain Edu?      Excl. in GC?     Constitutional: Appears uncomfortable and somewhat disheveled and malodorous Eyes: PERRL EOMI. Head: Atraumatic. Nose: No congestion/rhinnorhea. Mouth/Throat: No trismus Neck: No stridor.   Cardiovascular: Normal rate, regular rhythm. Grossly normal heart sounds.  Good peripheral circulation. Respiratory: Normal respiratory effort.  No retractions. Lungs CTAB and moving good air Gastrointestinal: Soft nontender Musculoskeletal: Right leg is shortened and externally rotated although no discomfort with logroll her ranging of the head  Neurologic:  Normal speech and language. No gross focal neurologic deficits are appreciated. Skin:  Ecchymosis across the left lateral chest as well as medial aspect of the left arm Erythema along the dorsal aspect of the left leg Psychiatric: Strange affect    ____________________________________________   DIFFERENTIAL includes but not limited to  Rhabdomyolysis, dehydration, acute kidney injury, hip fracture,  hip dislocation, pneumothorax, hemothorax, acute coronary syndrome ____________________________________________   LABS (all labs ordered are listed, but only abnormal results are displayed)  Labs Reviewed  BASIC METABOLIC PANEL - Abnormal; Notable for the following:       Result Value   Chloride 99 (*)    CO2 21 (*)    Glucose, Bld 147 (*)    BUN 42 (*)    Creatinine, Ser 1.87 (*)    GFR calc non Af Amer 36 (*)    GFR calc Af Amer 42 (*)    Anion gap 17 (*)    All other components within normal limits  HEPATIC FUNCTION PANEL - Abnormal; Notable for the following:    Total Protein 8.3 (*)    AST 89 (*)    Alkaline Phosphatase 137 (*)    Total Bilirubin 1.6 (*)    Indirect Bilirubin 1.4 (*)    All other components within normal limits  TROPONIN I - Abnormal; Notable for the following:    Troponin I 3.50 (*)  All other components within normal limits  CBC WITH DIFFERENTIAL/PLATELET - Abnormal; Notable for the following:    WBC 17.0 (*)    RDW 15.3 (*)    Neutro Abs 15.5 (*)    Lymphs Abs 0.6 (*)    All other components within normal limits  CK - Abnormal; Notable for the following:    Total CK 4,083 (*)    All other components within normal limits  PROTIME-INR - Abnormal; Notable for the following:    Prothrombin Time 15.9 (*)    All other components within normal limits  APTT  URINALYSIS, COMPLETE (UACMP) WITH MICROSCOPIC    Significantly elevated troponin as well as CK concerning for non-ST elevation myocardial infarction as well as rhabdomyolysis. GFR is about half of his previous __________________________________________  EKG  ED ECG REPORT I, Merrily BrittleNeil Aviendha Azbell, the attending physician, personally viewed and interpreted this ECG.  Date: 03/01/2017 EKG Time: 1354 Rate: 95 Rhythm: normal sinus rhythm QRS Axis: Rightward Intervals: normal ST/T Wave abnormalities: normal Narrative Interpretation: Abnormal   ED ECG REPORT I, Merrily BrittleNeil Tanna Loeffler, the attending  physician, personally viewed and interpreted this ECG.  Date: 03/01/2017 EKG Time: 1454 Rate: 94 Rhythm: normal sinus rhythm QRS Axis: normal Intervals: normal ST/T Wave abnormalities: normal Narrative Interpretation: Abnormal but unchanged  ____________________________________________  RADIOLOGY  Chest x-ray unremarkable hip x-ray concerning for chronic deformity versus possibly acute fracture ____________________________________________   PROCEDURES  Procedure(s) performed: no  Procedures  Critical Care performed: yes  CRITICAL CARE Performed by: Merrily BrittleNeil Demere Dotzler   Total critical care time: 40 minutes  Critical care time was exclusive of separately billable procedures and treating other patients.  Critical care was necessary to treat or prevent imminent or life-threatening deterioration.  Critical care was time spent personally by me on the following activities: development of treatment plan with patient and/or surrogate as well as nursing, discussions with consultants, evaluation of patient's response to treatment, examination of patient, obtaining history from patient or surrogate, ordering and performing treatments and interventions, ordering and review of laboratory studies, ordering and review of radiographic studies, pulse oximetry and re-evaluation of patient's condition.   Observation: no ____________________________________________   INITIAL IMPRESSION / ASSESSMENT AND PLAN / ED COURSE  Pertinent labs & imaging results that were available during my care of the patient were reviewed by me and considered in my medical decision making (see chart for details).  The patient arrives somewhat confused with obvious trauma to the left side of his chest and arm. His right leg is shortened and externally rotated although he has shocking lead little discomfort when ranging the right hip. Differential is broad but as he was down overnight and concerned for rhabdomyolysis,  dehydration, as well as acute coronary syndrome and other orthopedic injuries. Rub labs are pending.    ----------------------------------------- 3:03 PM on 03/01/2017 -----------------------------------------  The patient has an acute kidney injury with a GFR down to 42. He also is in rhabdomyolysis. Most concerning is his troponin of 3.50. EKG 2 shows no ischemic changes but this is certainly concerning for non-ST elevation myocardial infarction.  _ I discussed the patient with Dr. Kirke CorinArida on-call for unassigned cardiology who recommends heparinization and he will evaluate the patient once they are admitted to the hospital.   ----------------------------------------- 4:35 PM on 03/01/2017 -----------------------------------------  [facility] came to evaluate the patient and the patient is now stating that he has seen a cardiologist in the past and he thinks it is Dr. Gwen PoundsKowalski. On chart review we  have no notes from her cardiologist however the patient says he would be happy to see Dr. Gwen Pounds again so I will reach out to [facility] cardiology. ___________________________________________  ----------------------------------------- 4:41 PM on 03/01/2017 -----------------------------------------  I discussed the case with Dr. Juliann Pares who will kindly consult on the patient.  FINAL CLINICAL IMPRESSION(S) / ED DIAGNOSES  Final diagnoses:  NSTEMI (non-ST elevated myocardial infarction) (HCC)  Traumatic rhabdomyolysis, initial encounter (HCC)  Acute kidney injury (HCC)      NEW MEDICATIONS STARTED DURING THIS VISIT:  New Prescriptions   No medications on file     Note:  This document was prepared using Dragon voice recognition software and may include unintentional dictation errors.     Merrily Brittle, MD 03/01/17 [phone]

## 2017-03-01 NOTE — ED Notes (Signed)
Pt had urinated some in the bed. Pt cleaned, new linen and chuck placed under pt. Brief placed under pt as well. Pt moved up in bed and verbalized comfort. New yellow socks given to pt.

## 2017-03-01 NOTE — ED Notes (Signed)
Pt taken to CT.

## 2017-03-01 NOTE — ED Notes (Signed)
Pt denies blood thinner use. Pt hasn't eaten or drank anything since last night.  Friend comes by to check on pt every day or two and found pt lying on floor.  Pt answering questions appropriately. States he is sore from fall.

## 2017-03-01 NOTE — H&P (Signed)
[employer] [facility] [place] [facility]   PATIENT NAME: Clayton Green    MR#:  [id number]  DATE OF BIRTH:  05/23/1951  DATE OF ADMISSION:  03/01/2017  PRIMARY CARE PHYSICIAN: Smitty CordsKaramalegos, Alexander J, DO   REQUESTING/REFERRING PHYSICIAN: Rifenbark  CHIEF COMPLAINT:   Chief Complaint  Patient presents with  . Fall    HISTORY OF PRESENT ILLNESS: Clayton Green  is a 66 y.o. male with a known history of Hypothyroidism, depression, diabetes, hyperlipidemia, hypertension, vitamin D deficiency- lives in an apartment building, wanted to walk to the bathroom in the night and lost his balance and fell down on the floor since then he could not get up and he said that he stayed all night and then part of the day on the floor he did not had phone with him so could not call anybody finally he is a good friend came to check on him and found him on the floor and called EMS. Patient denies losing consciousness, any chest pain, cough or fever. With EMS patient had some pain in his right side of the hip and could not standup properly so they decided to bring him to the emergency room. In ER he is noted to be dehydrated, high CK level, and high troponin level. His white blood cell count is also high and patient has urinary incontinence and he smells of urine in his room. ER physician spoke to on call cardiologist he suggested to start on heparin IV drip. X-ray on the right hip also showed some questionable findings and could not say properly whether there is a fracture or not. Patient could not give a urine sample until I had seen him as he is dehydrated and he is receiving IV fluids by ER so hopefully we will have the sample.  PAST MEDICAL HISTORY:   Past Medical History:  Diagnosis Date  . Acquired hypothyroidism    a. s/p thyroidectomy.  . Arthritis   . Depression   . Diabetes mellitus without complication (HCC)   . GERD (gastroesophageal reflux disease)   . Hyperlipidemia   . Hypertension    . Vitamin D deficiency     PAST SURGICAL HISTORY: Past Surgical History:  Procedure Laterality Date  . colonscopy    . THYROIDECTOMY, PARTIAL      SOCIAL HISTORY:  Social History  Substance Use Topics  . Smoking status: Never Smoker  . Smokeless tobacco: Never Used  . Alcohol use No    FAMILY HISTORY:  Family History  Problem Relation Age of Onset  . Diabetes Mother   . Cancer Father     DRUG ALLERGIES:  Allergies  Allergen Reactions  . Shellfish Allergy Nausea And Vomiting    REVIEW OF SYSTEMS:   CONSTITUTIONAL: No fever,Positive for fatigue or weakness.  EYES: No blurred or double vision.  EARS, NOSE, AND THROAT: No tinnitus or ear pain.  RESPIRATORY: No cough, shortness of breath, wheezing or hemoptysis.  CARDIOVASCULAR: No chest pain, orthopnea, edema.  GASTROINTESTINAL: No nausea, vomiting, diarrhea or abdominal pain.  GENITOURINARY: No dysuria, hematuria.  ENDOCRINE: No polyuria, nocturia,  HEMATOLOGY: No anemia, easy bruising or bleeding SKIN: No rash or lesion. MUSCULOSKELETAL: No joint pain or arthritis.   NEUROLOGIC: No tingling, numbness, weakness.  PSYCHIATRY: No anxiety or depression.   MEDICATIONS AT HOME:  Prior to Admission medications   Medication Sig Start Date End Date Taking? Authorizing Provider  aspirin 81 MG tablet Take 1 tablet (81 mg total) by mouth daily. 09/21/16  Yes Smitty Cords, DO  Cholecalciferol 2000 units CAPS Take 1 capsule (2,000 Units total) by mouth daily. 08/24/16  Yes Karamalegos, Netta Neat, DO  furosemide (LASIX) 20 MG tablet Take 1 tablet (20 mg total) by mouth every other day. 09/21/16  Yes Karamalegos, Netta Neat, DO  levothyroxine (SYNTHROID, LEVOTHROID) 88 MCG tablet Take 2 tablets (176 mcg total) by mouth daily before breakfast. 09/11/16  Yes Karamalegos, Netta Neat, DO  lisinopril (PRINIVIL,ZESTRIL) 10 MG tablet Take 1 tablet (10 mg total) by mouth daily. 08/12/16  Yes Karamalegos, Netta Neat, DO   metFORMIN (GLUCOPHAGE) 500 MG tablet Take 1 tablet (500 mg total) by mouth 2 (two) times daily with a meal. 08/12/16  Yes Karamalegos, Alexander J, DO  PARoxetine (PAXIL) 30 MG tablet Take 1 tablet (30 mg total) by mouth daily. 08/12/16  Yes Karamalegos, Netta Neat, DO  ranitidine (ZANTAC) 150 MG tablet Take 1 tablet (150 mg total) by mouth at bedtime. 08/12/16  Yes Karamalegos, Netta Neat, DO  traZODone (DESYREL) 50 MG tablet Take 50 mg by mouth at bedtime.  03/07/15  Yes [provider]  acetaminophen (TYLENOL) 500 MG tablet Take 500 mg by mouth 2 (two) times daily as needed.    [provider]  atorvastatin (LIPITOR) 20 MG tablet Take 1 tablet (20 mg total) by mouth daily. 09/11/16   Karamalegos, Netta Neat, DO  mineral oil-hydrophilic petrolatum (AQUAPHOR) ointment Apply topically as needed for dry skin. Apply to legs daily. 05/14/15   Janeann Forehand., MD      PHYSICAL EXAMINATION:   VITAL SIGNS: Blood pressure 129/66, pulse (!) 102, temperature 98.5 F (36.9 C), temperature source Oral, resp. rate (!) 29, height 5\' 7"  (1.702 m), weight 113.4 kg (250 lb), SpO2 92 %.  GENERAL:  66 y.o.-year-old patient lying in the bed with no acute distress.  EYES: Pupils equal, round, reactive to light and accommodation. No scleral icterus. Extraocular muscles intact.  HEENT: Head atraumatic, normocephalic. Oropharynx and nasopharynx clear.  NECK:  Supple, no jugular venous distention. No thyroid enlargement, no tenderness.  LUNGS: Normal breath sounds bilaterally, no wheezing, rales,rhonchi or crepitation. No use of accessory muscles of respiration.  CARDIOVASCULAR: S1, S2 normal. No murmurs, rubs, or gallops.  ABDOMEN: Soft, nontender, nondistended. Bowel sounds present. No organomegaly or mass.  EXTREMITIES: No pedal edema, cyanosis, or clubbing.  NEUROLOGIC: Cranial nerves II through XII are intact. Muscle strength 5/5 in all extremities. Sensation intact. Gait not checked.   PSYCHIATRIC: The patient is alert and oriented x 3.  SKIN: No obvious rash, lesion, or ulcer.   LABORATORY PANEL:   CBC  Recent Labs Lab 03/01/17 1353  WBC 17.0*  HGB 13.9  HCT 41.5  PLT 204  MCV 86.4  MCH 28.9  MCHC 33.4  RDW 15.3*  LYMPHSABS 0.6*  MONOABS 0.8  EOSABS 0.0  BASOSABS 0.1   ------------------------------------------------------------------------------------------------------------------  Chemistries   Recent Labs Lab 03/01/17 1353  NA 137  K 4.6  CL 99*  CO2 21*  GLUCOSE 147*  BUN 42*  CREATININE 1.87*  CALCIUM 9.4  AST 89*  ALT 25  ALKPHOS 137*  BILITOT 1.6*   ------------------------------------------------------------------------------------------------------------------ estimated creatinine clearance is 46.7 mL/min (A) (by C-G formula based on SCr of 1.87 mg/dL (H)). ------------------------------------------------------------------------------------------------------------------ No results for input(s): TSH, T4TOTAL, T3FREE, THYROIDAB in the last 72 hours.  Invalid input(s): FREET3   Coagulation profile  Recent Labs Lab 03/01/17 1353  INR 1.26   ------------------------------------------------------------------------------------------------------------------- No results for input(s): DDIMER in the  last 72 hours. -------------------------------------------------------------------------------------------------------------------  Cardiac Enzymes  Recent Labs Lab 03/01/17 1353  TROPONINI 3.50*   ------------------------------------------------------------------------------------------------------------------ Invalid input(s): POCBNP  ---------------------------------------------------------------------------------------------------------------  Urinalysis    Component Value Date/Time   COLORURINE YELLOW (A) 06/21/2016 1205   APPEARANCEUR CLEAR (A) 06/21/2016 1205   LABSPEC 1.014 06/21/2016 1205   PHURINE 6.0 06/21/2016  1205   GLUCOSEU NEGATIVE 06/21/2016 1205   HGBUR NEGATIVE 06/21/2016 1205   BILIRUBINUR NEGATIVE 06/21/2016 1205   KETONESUR 1+ (A) 06/21/2016 1205   PROTEINUR 30 (A) 06/21/2016 1205   NITRITE NEGATIVE 06/21/2016 1205   LEUKOCYTESUR NEGATIVE 06/21/2016 1205     RADIOLOGY: Dg Chest Port 1 View  Result Date: 03/01/2017 CLINICAL DATA:  Pain following fall.  Hypertension. EXAM: PORTABLE CHEST 1 VIEW COMPARISON:  June 21, 2016 FINDINGS: There is mild right base atelectasis. Lungs elsewhere are clear. The heart size and pulmonary vascularity are normal. Aorta is mildly prominent but stable. No adenopathy. Bones are osteoporotic. IMPRESSION: Mild right base atelectasis. No edema or consolidation. Stable cardiac silhouette. Aortic prominence likely is indicative of chronic hypertension. Electronically Signed   By: Bretta Bang III M.D.   On: 03/01/2017 14:58   Dg Hip Unilat With Pelvis 2-3 Views Right  Result Date: 03/01/2017 CLINICAL DATA:  Fall with right lower extremity foreshortened and externally rotated EXAM: DG HIP (WITH OR WITHOUT PELVIS) 2-3V RIGHT COMPARISON:  None. FINDINGS: Frontal pelvis as well as frontal and lateral right hip images obtained. A well-defined fracture is not appreciable on this examination. There is suggestion of a subcapital femoral neck fracture on the frontal view. 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 No demonstrable dislocation. There is mild narrowing of the left hip joint. There is underlying osteoporosis. IMPRESSION: A well-defined fracture is not appreciable by radiography. There is a suggestion of potential impaction in the subcapital femoral region. There is no other evidence suggesting potential fracture. No dislocation. 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 Bones osteoporotic.  Mild narrowing left hip joint. Given this circumstance, MR of the pelvis and right hip could be extremely helpful both to assess for potential impaction in the subcapital femoral neck region as well as to assess for avascular necrosis. Electronically Signed   By: Bretta Bang III M.D.   On: 03/01/2017 14:57    EKG: Orders placed or performed during the hospital encounter of 03/01/17  . ED EKG  . ED EKG  . EKG 12-Lead  . EKG 12-Lead    IMPRESSION AND PLAN:  * Non-ST elevation MI   IV heparin drip was advised by cardiologist.   Check lipid panel and follow serial troponins. Keep on telemetry monitoring.   Cardiology consult for further management.   Echocardiogram.  * Fall   Most likely he may have UTI as he has urinary incontinence and elevated white blood cell count.    Still the urine sample is not collected. He would wait on the sample and start on antibiotic if needed after that.  * Pain on the right hip.   X-ray right hip does not give clear idea about fracture, there are some chronic changes in the joint. I will get a CT scan on the right hip to evaluate it better.  * Acute renal failure   This is secondary to dehydration  for being on the floor for long, I will get a IV fluid running and check tomorrow.  * Rhabdomyolysis   CK level is high, checked after IV fluids tomorrow.      All the records are reviewed and case discussed with ED provider. Management plans discussed with the patient, family and they are in agreement.  CODE STATUS: Full code Code Status History    Date Active Date Inactive Code Status Order ID Comments User Context   06/21/2016  4:02 PM 06/23/2016  6:54 PM Full Code [id number]  Salley Hews, MD ED       TOTAL TIME TAKING CARE OF THIS PATIENT: 50 critical care minutes.    Altamese Dilling M.D on 03/01/2017   Between 7am to 6pm - Pager - [phone]  After 6pm  go to [email or web address] - password [email or web address]  [facility]  Office  [phone]  CC: Primary care physician; Smitty Cords, DO   Note: This dictation was prepared with Dragon dictation along with smaller phrase technology. Any transcriptional errors that result from this process are unintentional.

## 2017-03-01 NOTE — Progress Notes (Signed)
CH responded to OR from Pt for prayer. Pt indicated that he will need more prayer.

## 2017-03-01 NOTE — ED Notes (Signed)
Cleaned pt with wipes.

## 2017-03-01 NOTE — ED Notes (Signed)
Pt taken to xray via stretcher  

## 2017-03-01 NOTE — ED Notes (Signed)
Date and time results received: 03/01/17 1450  Test: Troponin Critical Value: 3.5  Name of Provider Notified: Dr. Lamont Snowballifenbark

## 2017-03-01 NOTE — ED Triage Notes (Signed)
Pt presents to ED via [facility] from home. Pt fell last night about 6 or 7pm. Pt laid on floor all night. States landed on L side, bruising to L ribs and L bicep. Pt R leg appears shortened. States pain with movement of legs. EMS states pt was unable to walk on scene once they got pt off floor. Pt denies hitting head. States he believes he was dizzy and slipped on linoleum floor. Pt arrives with smell of urine on him. States buttock pain from fall.

## 2017-03-01 NOTE — Progress Notes (Signed)
CH responded to PG from Pt who just wanted to talk about his situation. He fail down at home and was brought to the hospital. He has no children and has a strained relationship with his 2 sisters.

## 2017-03-01 NOTE — Consult Note (Signed)
ANTICOAGULATION CONSULT NOTE - Initial Consult  Pharmacy Consult for heparin drip Indication: chest pain/ACS  Allergies  Allergen Reactions  . Shellfish Allergy Nausea And Vomiting    Patient Measurements: Height: 5\' 7"  (170.2 cm) Weight: 250 lb (113.4 kg) IBW/kg (Calculated) : 66.1 Heparin Dosing Weight: 91.9kg (based off of pt estimated weight of 250lb-bed in ER does not work)  Vital Signs: Temp: 98.5 F (36.9 C) (07/16 1400) Temp Source: Oral (07/16 1400) BP: 145/92 (07/16 1500) Pulse Rate: 95 (07/16 1500)  Labs:  Recent Labs  03/01/17 1353  HGB 13.9  HCT 41.5  PLT 204  CREATININE 1.87*  CKTOTAL 4,083*  TROPONINI 3.50*    Estimated Creatinine Clearance: 46.7 mL/min (A) (by C-G formula based on SCr of 1.87 mg/dL (H)).   Medical History: Past Medical History:  Diagnosis Date  . Arthritis   . Depression   . Diabetes mellitus without complication (HCC)   . GERD (gastroesophageal reflux disease)   . Hypertension   . Hypothyroid   . Vitamin D deficiency     Medications:  Scheduled:  . heparin  4,000 Units Intravenous Once    Assessment: Pt is a 66 year old male who presents s/p fall last night. Pt has been on the floor all night. Pt has an elevated troponin 3.5. Pt denies any anticoagulation use. Pharmacy consulted to dose heparin drip. Baseline APTT and INR added onto labs.  Goal of Therapy:  Heparin level 0.3-0.7 units/ml Monitor platelets by anticoagulation protocol: Yes   Plan:  Give 4000 units bolus x 1 Start heparin infusion at 1100 units/hr Check anti-Xa level in 6 hours and daily while on heparin Continue to monitor H&H and platelets  Follow up on accurate weight when pt gets to the floor  Navneet Schmuck D Braylinn Gulden, Pharm.D, BCPS Clinical Pharmacist  03/01/2017,3:17 PM

## 2017-03-02 ENCOUNTER — Inpatient Hospital Stay
Admit: 2017-03-02 | Discharge: 2017-03-02 | Disposition: A | Discharge status: Home / Self Care | Payer: Medicare Other | Attending: Internal Medicine | Admitting: Internal Medicine

## 2017-03-02 ENCOUNTER — Inpatient Hospital Stay: Payer: Medicare Other

## 2017-03-02 LAB — BASIC METABOLIC PANEL
Anion gap: 8 (ref 5–15)
BUN: 38 mg/dL — AB (ref 6–20)
CALCIUM: 8.2 mg/dL — AB (ref 8.9–10.3)
CHLORIDE: 106 mmol/L (ref 101–111)
CO2: 22 mmol/L (ref 22–32)
CREATININE: 1.45 mg/dL — AB (ref 0.61–1.24)
GFR, EST AFRICAN AMERICAN: 56 mL/min — AB (ref >60)
GFR, EST NON AFRICAN AMERICAN: 49 mL/min — AB (ref >60)
Glucose, Bld: 107 mg/dL — ABNORMAL HIGH (ref 65–99)
Potassium: 3.8 mmol/L (ref 3.5–5.1)
SODIUM: 136 mmol/L (ref 135–145)

## 2017-03-02 LAB — CBC
HCT: 35.4 % — ABNORMAL LOW (ref 40.0–52.0)
HEMOGLOBIN: 11.8 g/dL — AB (ref 13.0–18.0)
MCH: 28.5 pg (ref 26.0–34.0)
MCHC: 33.4 g/dL (ref 32.0–36.0)
MCV: 85.4 fL (ref 80.0–100.0)
PLATELETS: 173 10*3/uL (ref 150–440)
RBC: 4.14 MIL/uL — ABNORMAL LOW (ref 4.40–5.90)
RDW: 15.6 % — ABNORMAL HIGH (ref 11.5–14.5)
WBC: 12.9 10*3/uL — ABNORMAL HIGH (ref 3.8–10.6)

## 2017-03-02 LAB — ECHOCARDIOGRAM COMPLETE
Height: 67 in
WEIGHTICAEL: 4000 [oz_av]

## 2017-03-02 LAB — CK: CK TOTAL: 5019 U/L — AB (ref 49–397)

## 2017-03-02 LAB — HEPARIN LEVEL (UNFRACTIONATED)
HEPARIN UNFRACTIONATED: 0.22 [IU]/mL — AB (ref 0.30–0.70)
HEPARIN UNFRACTIONATED: 0.15 [IU]/mL — AB (ref 0.30–0.70)
HEPARIN UNFRACTIONATED: 0.12 [IU]/mL — AB (ref 0.30–0.70)

## 2017-03-02 LAB — TROPONIN I
TROPONIN I: 4.75 ng/mL — AB (ref <0.03)
TROPONIN I: 7.87 ng/mL — AB (ref <0.03)
TROPONIN I: 8.27 ng/mL — AB (ref <0.03)
Troponin I: 6.61 ng/mL (ref <0.03)

## 2017-03-02 MED ORDER — HEPARIN BOLUS VIA INFUSION
3500.0000 [IU] | Freq: Once | INTRAVENOUS | Status: AC
  Administered 2017-03-02: 3500 [IU] via INTRAVENOUS
  Filled 2017-03-02: qty 3500

## 2017-03-02 MED ORDER — HEPARIN BOLUS VIA INFUSION
2750.0000 [IU] | Freq: Once | INTRAVENOUS | Status: AC
  Administered 2017-03-02: 2750 [IU] via INTRAVENOUS
  Filled 2017-03-02: qty 2750

## 2017-03-02 MED ORDER — HEPARIN BOLUS VIA INFUSION
1400.0000 [IU] | Freq: Once | INTRAVENOUS | Status: AC
  Administered 2017-03-02: 1400 [IU] via INTRAVENOUS
  Filled 2017-03-02: qty 1400

## 2017-03-02 MED ORDER — OXYCODONE-ACETAMINOPHEN 5-325 MG PO TABS
1.0000 | ORAL_TABLET | ORAL | Status: DC | PRN
  Administered 2017-03-02 – 2017-03-04 (×5): 2 via ORAL
  Filled 2017-03-02 (×5): qty 2

## 2017-03-02 NOTE — Consult Note (Signed)
ANTICOAGULATION CONSULT NOTE - Initial Consult  Pharmacy Consult for heparin drip Indication: chest pain/ACS  Allergies  Allergen Reactions  . Shellfish Allergy Nausea And Vomiting    Patient Measurements: Height: 5\' 7"  (170.2 cm) Weight: 250 lb (113.4 kg) IBW/kg (Calculated) : 66.1 Heparin Dosing Weight: 91.9kg (based off of pt estimated weight of 250lb-bed in ER does not work)  Vital Signs: Temp: 99 F (37.2 C) (07/17 0455) Temp Source: Oral (07/17 0455) BP: 119/64 (07/17 0455) Pulse Rate: 93 (07/17 0455)  Labs:  Recent Labs  03/01/17 1353  03/01/17 2357 03/02/17 0559 03/02/17 0817 03/02/17 1057  HGB 13.9  --  11.8*  --   --   --   HCT 41.5  --  35.4*  --   --   --   PLT 204  --  173  --   --   --   APTT 27  --   --   --   --   --   LABPROT 15.9*  --   --   --   --   --   INR 1.26  --   --   --   --   --   HEPARINUNFRC  --   --  0.12*  --  0.22*  --   CREATININE 1.87*  --  1.45*  --   --   --   CKTOTAL 4,083*  --  5,019*  --   --   --   TROPONINI 3.50*  < > 8.27* 7.87*  --  6.61*  < > = values in this interval not displayed.  Estimated Creatinine Clearance: 60.2 mL/min (A) (by C-G formula based on SCr of 1.45 mg/dL (H)).   Medical History: Past Medical History:  Diagnosis Date  . Acquired hypothyroidism    a. s/p thyroidectomy.  . Arthritis   . Depression   . Diabetes mellitus without complication (HCC)   . GERD (gastroesophageal reflux disease)   . Hyperlipidemia   . Hypertension   . Vitamin D deficiency     Medications:  Scheduled:  . aspirin EC  81 mg Oral Daily  . cholecalciferol  2,000 Units Oral Daily  . famotidine  10 mg Oral BID  . heparin  1,400 Units Intravenous Once  . levothyroxine  176 mcg Oral QAC breakfast  . PARoxetine  30 mg Oral Daily  . traZODone  50 mg Oral QHS    Assessment: Pt is a 66 year old male who presents s/p fall last night. Pt has been on the floor all night. Pt has an elevated troponin 3.5. Pt denies any  anticoagulation use. Pharmacy consulted to dose heparin drip. Baseline APTT and INR added onto labs.  Goal of Therapy:  Heparin level 0.3-0.7 units/ml Monitor platelets by anticoagulation protocol: Yes   Plan:  Give 4000 units bolus x 1 Start heparin infusion at 1100 units/hr Check anti-Xa level in 6 hours and daily while on heparin Continue to monitor H&H and platelets  Follow up on accurate weight when pt gets to the floor  7/17 @ 0000 HL 0.12 subtherapeutic. Will rebolus w/ heparin 3500 units IV x 1 and increase rate to 1300 units/hr and will recheck Hl @ 0800. CBC trending down.  7/17 0817 HL subtherapeutic. 1400 units IV x 1 bolus and increase rate to 1500 units/hr. Will recheck HL in 6 hours.  Damany Eastman A. Dahlia Bailiffookson, PharmD, BCPS Clinical Pharmacist 03/02/2017

## 2017-03-02 NOTE — Progress Notes (Signed)
Troponin increased to 8.27. MD Anne HahnWillis made aware. No c/o chest pain. Will continue to monitor

## 2017-03-02 NOTE — Progress Notes (Signed)
*  PRELIMINARY RESULTS* Echocardiogram 2D Echocardiogram has been performed.  Cristela BlueHege, Terryl Niziolek 03/02/2017, 8:28 AM

## 2017-03-02 NOTE — Progress Notes (Signed)
MD notified. Pt complains of pain and states he has to take large doses of tylenol at home to help relieve pain. Also seeking a PT consult for patient 2/2 from home alone and was found in the bathroom floor at home. I will continue to assess.

## 2017-03-02 NOTE — Progress Notes (Signed)
[facility] - [place] [facility]                                                                                                                                                                                  Patient Demographics   Clayton Green, is a 66 y.o. male, DOB - 12/23/1950, NWG:[id number]  Admit date - 03/01/2017   Admitting Physician Altamese DillingVaibhavkumar Vachhani, MD  Outpatient Primary MD for the patient is Smitty CordsKaramalegos, Alexander J, DO   LOS - 1  Subjective: Patient currently complains of feeling weak but no chest pain or shortness of breath    Review of Systems:   CONSTITUTIONAL: No documented fever.Positive fatigue, positive weakness. No weight gain, no weight loss.  EYES: No blurry or double vision.  ENT: No tinnitus. No postnasal drip. No redness of the oropharynx.  RESPIRATORY: No cough, no wheeze, no hemoptysis. No dyspnea.  CARDIOVASCULAR: No chest pain. No orthopnea. No palpitations. No syncope.  GASTROINTESTINAL: No nausea, no vomiting or diarrhea. No abdominal pain. No melena or hematochezia.  GENITOURINARY: No dysuria or hematuria.  ENDOCRINE: No polyuria or nocturia. No heat or cold intolerance.  HEMATOLOGY: No anemia. No bruising. No bleeding.  INTEGUMENTARY: No rashes. No lesions.  MUSCULOSKELETAL: No arthritis. No swelling. No gout.  NEUROLOGIC: No numbness, tingling, or ataxia. No seizure-type activity.  PSYCHIATRIC: No anxiety. No insomnia. No ADD.    Vitals:   Vitals:   03/01/17 1630 03/01/17 1747 03/01/17 1829 03/02/17 0455  BP: 129/66 118/77 125/71 119/64  Pulse: (!) 102 (!) 104 97 93  Resp: (!) 29 (!) 32 18   Temp:   99.5 F (37.5 C) 99 F (37.2 C)  TempSrc:   Oral Oral  SpO2: 92% 92% 100%   Weight:      Height:        Wt Readings from Last 3 Encounters:  03/01/17 250 lb (113.4 kg)  06/21/16 264 lb 14.4 oz (120.2 kg)  07/23/15 234 lb 3.2 oz (106.2 kg)     Intake/Output Summary (Last 24 hours) at 03/02/17 1522 Last data  filed at 03/02/17 1355  Gross per 24 hour  Intake          3435.01 ml  Output              525 ml  Net          2910.01 ml    Physical Exam:   GENERAL: Pleasant-appearing in no apparent distress.  HEAD, EYES, EARS, NOSE AND THROAT: Atraumatic, normocephalic. Extraocular muscles are intact. Pupils equal and reactive to light. Sclerae anicteric. No conjunctival injection. No oro-pharyngeal erythema.  NECK: Supple. There is no jugular venous distention.  No bruits, no lymphadenopathy, no thyromegaly.  HEART: Regular rate and rhythm,. No murmurs, no rubs, no clicks.  LUNGS: Clear to auscultation bilaterally. No rales or rhonchi. No wheezes.  ABDOMEN: Soft, flat, nontender, nondistended. Has good bowel sounds. No hepatosplenomegaly appreciated.  EXTREMITIES: No evidence of any cyanosis, clubbing, or peripheral edema.  +2 pedal and radial pulses bilaterally.  NEUROLOGIC: The patient is alert, awake, and oriented x3 with no focal motor or sensory deficits appreciated bilaterally.  SKIN: Moist and warm with no rashes appreciated.  Psych: Not anxious, depressed LN: No inguinal LN enlargement    Antibiotics   Anti-infectives    None      Medications   Scheduled Meds: . aspirin EC  81 mg Oral Daily  . cholecalciferol  2,000 Units Oral Daily  . famotidine  10 mg Oral BID  . levothyroxine  176 mcg Oral QAC breakfast  . PARoxetine  30 mg Oral Daily  . traZODone  50 mg Oral QHS   Continuous Infusions: . sodium chloride 100 mL/hr at 03/02/17 1514  . heparin 1,500 Units/hr (03/02/17 1300)   PRN Meds:.acetaminophen, docusate sodium, oxyCODONE-acetaminophen   Data Review:   Micro Results No results found for this or any previous visit (from the past 240 hour(s)).  Radiology Reports Ct Hip Right Wo Contrast  Result Date: 03/01/2017 CLINICAL DATA:  Patient was found down. Right lower extremity deformity. EXAM: CT OF THE RIGHT HIP WITHOUT CONTRAST TECHNIQUE: Multidetector CT imaging of  the right hip was performed according to the standard protocol. Multiplanar CT image reconstructions were also generated. COMPARISON:  Radiographs dated 03/01/2017 FINDINGS: Bones/Joint/Cartilage There is no fracture or dislocation. 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 There is diffuse marked narrowing of the joint space with numerous erosions of the femoral head and acetabulum. However, there is no acute abnormality. No appreciable soft tissue hematoma. IMPRESSION: 1. No acute fracture or dislocation. 2. 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 Electronically Signed   By: Francene Boyers M.D.   On: 03/01/2017 17:13   Dg Chest Port 1 View  Result Date: 03/01/2017 CLINICAL DATA:  Pain following fall.  Hypertension. EXAM: PORTABLE CHEST 1 VIEW COMPARISON:  June 21, 2016 FINDINGS: There is mild right base atelectasis. Lungs elsewhere are clear. The heart size and pulmonary vascularity are normal. Aorta is mildly prominent but stable. No adenopathy. Bones are osteoporotic. IMPRESSION: Mild right base atelectasis. No edema or consolidation. Stable cardiac silhouette. Aortic prominence likely is indicative of chronic hypertension. Electronically Signed   By: Bretta Bang III M.D.   On: 03/01/2017 14:58   Dg Hip Unilat With Pelvis 2-3 Views Right  Result Date: 03/01/2017 CLINICAL DATA:  Fall with right lower extremity foreshortened and externally rotated EXAM: DG HIP (WITH OR WITHOUT PELVIS) 2-3V RIGHT COMPARISON:  None. FINDINGS: Frontal pelvis as well as frontal and lateral right hip images obtained. A well-defined fracture is not appreciable on this examination. There is suggestion of a subcapital femoral neck fracture on the frontal view. 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 No demonstrable dislocation.  There is mild narrowing of the left hip joint. There is underlying osteoporosis. IMPRESSION: A well-defined fracture is not appreciable by radiography. There is a suggestion of potential impaction in the subcapital femoral region. There is no other evidence suggesting potential fracture.  No dislocation. 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 Bones osteoporotic.  Mild narrowing left hip joint. Given this circumstance, MR of the pelvis and right hip could be extremely helpful both to assess for potential impaction in the subcapital femoral neck region as well as to assess for avascular necrosis. Electronically Signed   By: Bretta Bang III M.D.   On: 03/01/2017 14:57     CBC  Recent Labs Lab 03/01/17 1353 03/01/17 2357  WBC 17.0* 12.9*  HGB 13.9 11.8*  HCT 41.5 35.4*  PLT 204 173  MCV 86.4 85.4  MCH 28.9 28.5  MCHC 33.4 33.4  RDW 15.3* 15.6*  LYMPHSABS 0.6*  --   MONOABS 0.8  --   EOSABS 0.0  --   BASOSABS 0.1  --     Chemistries   Recent Labs Lab 03/01/17 1353 03/01/17 2357  NA 137 136  K 4.6 3.8  CL 99* 106  CO2 21* 22  GLUCOSE 147* 107*  BUN 42* 38*  CREATININE 1.87* 1.45*  CALCIUM 9.4 8.2*  AST 89*  --   ALT 25  --   ALKPHOS 137*  --   BILITOT 1.6*  --    ------------------------------------------------------------------------------------------------------------------ estimated creatinine clearance is 60.2 mL/min (A) (by C-G formula based on SCr of 1.45 mg/dL (H)). ------------------------------------------------------------------------------------------------------------------ No results for input(s): HGBA1C in the last 72 hours. ------------------------------------------------------------------------------------------------------------------  Recent Labs  03/01/17 1727  CHOL 169  HDL 51  LDLCALC 86  TRIG 162*  CHOLHDL 3.3    ------------------------------------------------------------------------------------------------------------------ No results for input(s): TSH, T4TOTAL, T3FREE, THYROIDAB in the last 72 hours.  Invalid input(s): FREET3 ------------------------------------------------------------------------------------------------------------------ No results for input(s): VITAMINB12, FOLATE, FERRITIN, TIBC, IRON, RETICCTPCT in the last 72 hours.  Coagulation profile  Recent Labs Lab 03/01/17 1353  INR 1.26    No results for input(s): DDIMER in the last 72 hours.  Cardiac Enzymes  Recent Labs Lab 03/01/17 2357 03/02/17 0559 03/02/17 1057  TROPONINI 8.27* 7.87* 6.61*   ------------------------------------------------------------------------------------------------------------------ Invalid input(s): POCBNP    Assessment & Plan  Patient is a 66 year old found on the floor with noted elevated troponin and CPK *Non-ST elevation MI Continue IV heparin cardiology consult pending  Continue aspirin  Stop cholesterol lowering medication due to elevated CPK which is very high      * Fall   with rhabdomyolysis continue supportive care follow CPK in the morning  * Pain on the right hip.   X-ray right hip does not give clear idea about fracture, there are some chronic changes in the joint. MRI of the hip   * Acute renal failure   This is secondary to dehydration for being on the floor for long, I will get a IV fluid running and check tomorrow.  * Rhabdomyolysis   CK level is high, recheck tomorrow     Code Status Orders        Start     Ordered   03/01/17 1902  Full code  Continuous     03/01/17 1901    Code Status History    Date Active Date Inactive Code Status Order ID Comments User Context   06/21/2016  4:02 PM 06/23/2016  6:54 PM Full Code [id number]  Salley Hews, MD ED           Consults  pt   DVT Prophylaxis heparin  Lab Results  Component Value Date    PLT 173  03/01/2017     Time Spent in minutes   55 Greater than 50% of time spent in care coordination and counseling patient regarding the condition and plan of care.   Auburn Bilberry M.D on 03/02/2017 at 3:22 PM  Between 7am to 6pm - Pager - [phone]  After 6pm go to [email or web address] - password [email or web address]  [facility]   Office  [phone]

## 2017-03-02 NOTE — Consult Note (Deleted)
Reason for Consult:chest pain known coronary disease Referring Physician: Dr. Lance Green, Dr. Georgie Green primary  Clayton Green. is an 66 y.o. male.  HPI: patient 66 year old white male presented with chest pain workup emergency room was essentially negative has history of permanent pacemaker and was placed for complete heart block bradycardia and atrial fibrillation patient subsequently had borderline troponin and was admitted for further evaluation and care. Patient has  STEMI presentation in April underwent cardiac catheter Found to have nonocclusive coronary disease patient however had a cardiac arrest appears to be related to complete heart block with some ventricular escape rhythms patient subsequently required uptemporal pacemaker and received a permanent pacemaker soon after. Patient's had episodes of atrial fibrillation not on anticoagulation. Patient feels reasonably  Stabbing in her chest but states to be getting better now  Past Medical History:  Diagnosis Date  . Acquired hypothyroidism    a. s/p thyroidectomy.  . Arthritis   . Depression   . Diabetes mellitus without complication ([place])   . GERD (gastroesophageal reflux disease)   . Hyperlipidemia   . Hypertension   . Vitamin D deficiency     Past Surgical History:  Procedure Laterality Date  . colonscopy    . THYROIDECTOMY, PARTIAL      Family History  Problem Relation Age of Onset  . Diabetes Mother   . Cancer Father     Social History:  reports that he has never smoked. He has never used smokeless tobacco. He reports that he does not drink alcohol or use drugs.  Allergies:  Allergies  Allergen Reactions  . Shellfish Allergy Nausea And Vomiting    Medications: I have reviewed the patient's current medications.  Results for orders placed or performed during the hospital encounter of 03/01/17 (from the past 48 hour(s))  Basic metabolic panel     Status: Abnormal   Collection Time: 03/01/17  1:53 PM   Result Value Ref Range   Sodium 137 135 - 145 mmol/L   Potassium 4.6 3.5 - 5.1 mmol/L   Chloride 99 (L) 101 - 111 mmol/L   CO2 21 (L) 22 - 32 mmol/L   Glucose, Bld 147 (H) 65 - 99 mg/dL   BUN 42 (H) 6 - 20 mg/dL   Creatinine, Ser 1.87 (H) 0.61 - 1.24 mg/dL   Calcium 9.4 8.9 - 10.3 mg/dL   GFR calc non Af Amer 36 (L) >60 mL/min   GFR calc Af Amer 42 (L) >60 mL/min    Comment: (NOTE) The eGFR has been calculated using the CKD EPI equation. This calculation has not been validated in all clinical situations. eGFR's persistently <60 mL/min signify possible Chronic Kidney Disease.    Anion gap 17 (H) 5 - 15  Hepatic function panel     Status: Abnormal   Collection Time: 03/01/17  1:53 PM  Result Value Ref Range   Total Protein 8.3 (H) 6.5 - 8.1 g/dL   Albumin 4.1 3.5 - 5.0 g/dL   AST 89 (H) 15 - 41 U/L   ALT 25 17 - 63 U/L   Alkaline Phosphatase 137 (H) 38 - 126 U/L   Total Bilirubin 1.6 (H) 0.3 - 1.2 mg/dL   Bilirubin, Direct 0.2 0.1 - 0.5 mg/dL   Indirect Bilirubin 1.4 (H) 0.3 - 0.9 mg/dL  Troponin I     Status: Abnormal   Collection Time: 03/01/17  1:53 PM  Result Value Ref Range   Troponin I 3.50 (HH) <0.03 ng/mL  Comment: CRITICAL RESULT CALLED TO, READ BACK BY AND VERIFIED WITH Clayton Green ON 03/01/17 AT 1444 QSD   CBC with Differential     Status: Abnormal   Collection Time: 03/01/17  1:53 PM  Result Value Ref Range   WBC 17.0 (H) 3.8 - 10.6 K/uL   RBC 4.80 4.40 - 5.90 MIL/uL   Hemoglobin 13.9 13.0 - 18.0 g/dL   HCT 41.5 40.0 - 52.0 %   MCV 86.4 80.0 - 100.0 fL   MCH 28.9 26.0 - 34.0 pg   MCHC 33.4 32.0 - 36.0 g/dL   RDW 15.3 (H) 11.5 - 14.5 %   Platelets 204 150 - 440 K/uL   Neutrophils Relative % 91 %   Neutro Abs 15.5 (H) 1.4 - 6.5 K/uL   Lymphocytes Relative 4 %   Lymphs Abs 0.6 (L) 1.0 - 3.6 K/uL   Monocytes Relative 5 %   Monocytes Absolute 0.8 0.2 - 1.0 K/uL   Eosinophils Relative 0 %   Eosinophils Absolute 0.0 0 - 0.7 K/uL   Basophils Relative 0  %   Basophils Absolute 0.1 0 - 0.1 K/uL  CK     Status: Abnormal   Collection Time: 03/01/17  1:53 PM  Result Value Ref Range   Total CK 4,083 (H) 49 - 397 U/L  APTT     Status: None   Collection Time: 03/01/17  1:53 PM  Result Value Ref Range   aPTT 27 24 - 36 seconds  Protime-INR     Status: Abnormal   Collection Time: 03/01/17  1:53 PM  Result Value Ref Range   Prothrombin Time 15.9 (H) 11.4 - 15.2 seconds   INR 1.26   Lipid panel     Status: Abnormal   Collection Time: 03/01/17  5:27 PM  Result Value Ref Range   Cholesterol 169 0 - 200 mg/dL   Triglycerides 162 (H) <150 mg/dL   HDL 51 >40 mg/dL   Total CHOL/HDL Ratio 3.3 RATIO   VLDL 32 0 - 40 mg/dL   LDL Cholesterol 86 0 - 99 mg/dL    Comment:        Total Cholesterol/HDL:CHD Risk Coronary Heart Disease Risk Table                     Men   Women  1/2 Average Risk   3.4   3.3  Average Risk       5.0   4.4  2 X Average Risk   9.6   7.1  3 X Average Risk  23.4   11.0        Use the calculated Patient Ratio above and the CHD Risk Table to determine the patient's CHD Risk.        ATP III CLASSIFICATION (LDL):  <100     mg/dL   Optimal  100-129  mg/dL   Near or Above                    Optimal  130-159  mg/dL   Borderline  160-189  mg/dL   High  >190     mg/dL   Very High   Troponin I     Status: Abnormal   Collection Time: 03/01/17  5:27 PM  Result Value Ref Range   Troponin I 5.67 (HH) <0.03 ng/mL    Comment: CRITICAL VALUE NOTED. VALUE IS CONSISTENT WITH PREVIOUSLY REPORTED/CALLED VALUE Clayton Green  Glucose, capillary     Status: Abnormal  Collection Time: 03/01/17  6:37 PM  Result Value Ref Range   Glucose-Capillary 117 (H) 65 - 99 mg/dL  Urinalysis, Complete w Microscopic     Status: Abnormal   Collection Time: 03/01/17  7:13 PM  Result Value Ref Range   Color, Urine YELLOW (A) YELLOW   APPearance HAZY (A) CLEAR   Specific Gravity, Urine 1.019 1.005 - 1.030   pH 5.0 5.0 - 8.0   Glucose, UA NEGATIVE NEGATIVE  mg/dL   Hgb urine dipstick LARGE (A) NEGATIVE   Bilirubin Urine NEGATIVE NEGATIVE   Ketones, ur 20 (A) NEGATIVE mg/dL   Protein, ur 100 (A) NEGATIVE mg/dL   Nitrite NEGATIVE NEGATIVE   Leukocytes, UA NEGATIVE NEGATIVE   RBC / HPF 0-5 0 - 5 RBC/hpf   WBC, UA 0-5 0 - 5 WBC/hpf   Bacteria, UA RARE (A) NONE SEEN   Squamous Epithelial / LPF 0-5 (A) NONE SEEN   Mucous PRESENT   Troponin I     Status: Abnormal   Collection Time: 03/01/17  8:17 PM  Result Value Ref Range   Troponin I 6.88 (HH) <0.03 ng/mL    Comment: CRITICAL RESULT CALLED TO, READ BACK BY AND VERIFIED WITH [place]  Troponin I     Status: Abnormal   Collection Time: 03/01/17 11:57 PM  Result Value Ref Range   Troponin I 8.27 (HH) <0.03 ng/mL    Comment: CRITICAL VALUE NOTED. VALUE IS CONSISTENT WITH PREVIOUSLY REPORTED/CALLED VALUE [facility]  Basic metabolic panel     Status: Abnormal   Collection Time: 03/01/17 11:57 PM  Result Value Ref Range   Sodium 136 135 - 145 mmol/L   Potassium 3.8 3.5 - 5.1 mmol/L   Chloride 106 101 - 111 mmol/L   CO2 22 22 - 32 mmol/L   Glucose, Bld 107 (H) 65 - 99 mg/dL   BUN 38 (H) 6 - 20 mg/dL   Creatinine, Ser 1.45 (H) 0.61 - 1.24 mg/dL   Calcium 8.2 (L) 8.9 - 10.3 mg/dL   GFR calc non Af Amer 49 (L) >60 mL/min   GFR calc Af Amer 56 (L) >60 mL/min    Comment: (NOTE) The eGFR has been calculated using the CKD EPI equation. This calculation has not been validated in all clinical situations. eGFR's persistently <60 mL/min signify possible Chronic Kidney Disease.    Anion gap 8 5 - 15  CK     Status: Abnormal   Collection Time: 03/01/17 11:57 PM  Result Value Ref Range   Total CK 5,019 (H) 49 - 397 U/L    Comment: RESULT CONFIRMED BY MANUAL DILUTION [place]  Heparin level (unfractionated)     Status: Abnormal   Collection Time: 03/01/17 11:57 PM  Result Value Ref Range   Heparin Unfractionated 0.12 (L) 0.30 - 0.70 IU/mL    Comment:        IF HEPARIN RESULTS ARE BELOW EXPECTED VALUES, AND  PATIENT DOSAGE HAS BEEN CONFIRMED, SUGGEST FOLLOW UP TESTING OF ANTITHROMBIN III LEVELS.   CBC     Status: Abnormal   Collection Time: 03/01/17 11:57 PM  Result Value Ref Range   WBC 12.9 (H) 3.8 - 10.6 K/uL   RBC 4.14 (L) 4.40 - 5.90 MIL/uL   Hemoglobin 11.8 (L) 13.0 - 18.0 g/dL   HCT 35.4 (L) 40.0 - 52.0 %   MCV 85.4 80.0 - 100.0 fL   MCH 28.5 26.0 - 34.0 pg   MCHC 33.4 32.0 - 36.0 g/dL   RDW 15.6 (H)  11.5 - 14.5 %   Platelets 173 150 - 440 K/uL  Troponin I     Status: Abnormal   Collection Time: 03/02/17  5:59 AM  Result Value Ref Range   Troponin I 7.87 (HH) <0.03 ng/mL    Comment: CRITICAL VALUE NOTED. VALUE IS CONSISTENT WITH PREVIOUSLY REPORTED/CALLED VALUE.Marland KitchenHKP  Heparin level (unfractionated)     Status: Abnormal   Collection Time: 03/02/17  8:17 AM  Result Value Ref Range   Heparin Unfractionated 0.22 (L) 0.30 - 0.70 IU/mL    Comment:        IF HEPARIN RESULTS ARE BELOW EXPECTED VALUES, AND PATIENT DOSAGE HAS BEEN CONFIRMED, SUGGEST FOLLOW UP TESTING OF ANTITHROMBIN III LEVELS.   Troponin I     Status: Abnormal   Collection Time: 03/02/17 10:57 AM  Result Value Ref Range   Troponin I 6.61 (HH) <0.03 ng/mL    Comment: CRITICAL VALUE NOTED. VALUE IS CONSISTENT WITH PREVIOUSLY REPORTED/CALLED VALUE.Marland KitchenHKP    Ct Hip Right Wo Contrast  Result Date: 03/01/2017 CLINICAL DATA:  Patient was found down. Right lower extremity deformity. EXAM: CT OF THE RIGHT HIP WITHOUT CONTRAST TECHNIQUE: Multidetector CT imaging of the right hip was performed according to the standard protocol. Multiplanar CT image reconstructions were also generated. COMPARISON:  Radiographs dated 03/01/2017 FINDINGS: Bones/Joint/Cartilage There is no fracture or dislocation. 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 There is diffuse marked narrowing of the joint space with numerous erosions of the femoral head and acetabulum. However, there is no  acute abnormality. No appreciable soft tissue hematoma. IMPRESSION: 1. No acute fracture or dislocation. 2. 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 Electronically Signed   By: Lorriane Shire M.D.   On: 03/01/2017 17:13   Dg Chest Port 1 View  Result Date: 03/01/2017 CLINICAL DATA:  Pain following fall.  Hypertension. EXAM: PORTABLE CHEST 1 VIEW COMPARISON:  June 21, 2016 FINDINGS: There is mild right base atelectasis. Lungs elsewhere are clear. The heart size and pulmonary vascularity are normal. Aorta is mildly prominent but stable. No adenopathy. Bones are osteoporotic. IMPRESSION: Mild right base atelectasis. No edema or consolidation. Stable cardiac silhouette. Aortic prominence likely is indicative of chronic hypertension. Electronically Signed   By: Lowella Grip III M.D.   On: 03/01/2017 14:58   Dg Hip Unilat With Pelvis 2-3 Views Right  Result Date: 03/01/2017 CLINICAL DATA:  Fall with right lower extremity foreshortened and externally rotated EXAM: DG HIP (WITH OR WITHOUT PELVIS) 2-3V RIGHT COMPARISON:  None. FINDINGS: Frontal pelvis as well as frontal and lateral right hip images obtained. A well-defined fracture is not appreciable on this examination. There is suggestion of a subcapital femoral neck fracture on the frontal view. 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 No demonstrable dislocation. There is mild narrowing of the left hip joint. There is underlying osteoporosis. IMPRESSION: A well-defined fracture is not appreciable by radiography. There is a suggestion of potential impaction in the subcapital femoral region. There is no other evidence suggesting potential fracture. No dislocation. 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 Bones  osteoporotic.  Mild narrowing left hip joint. Given this circumstance, MR of the pelvis and right hip could be extremely helpful both to assess for potential  impaction in the subcapital femoral neck region as well as to assess for avascular necrosis. Electronically Signed   By: Lowella Grip III M.D.   On: 03/01/2017 14:57    Review of Systems  Constitutional: Positive for malaise/fatigue.  Respiratory: Negative.   Cardiovascular: Positive for chest pain.  Gastrointestinal: Negative.   Genitourinary: Negative.   Musculoskeletal: Positive for myalgias.  Skin: Negative.   Neurological: Positive for weakness.  Endo/Heme/Allergies: Negative.   Psychiatric/Behavioral: Negative.    Blood pressure 119/64, pulse 93, temperature 99 F (37.2 C), temperature source Oral, resp. rate 18, height 5' 7"  (1.702 m), weight 113.4 kg (250 lb), SpO2 100 %. Physical Exam  Nursing note and vitals reviewed. Constitutional: He is oriented to person, place, and time. He appears well-developed and well-nourished.  HENT:  Head: Normocephalic and atraumatic.  Eyes: Pupils are equal, round, and reactive to light. Conjunctivae and EOM are normal.  Neck: Normal range of motion. Neck supple.  Cardiovascular: Normal rate, regular rhythm and normal heart sounds.   Respiratory: Effort normal and breath sounds normal.  GI: Soft. Bowel sounds are normal.  Musculoskeletal: Normal range of motion.  Neurological: He is alert and oriented to person, place, and time. He has normal reflexes.  Skin: Skin is dry.  Psychiatric: He has a normal mood and affect.    Assessment/Plan: Chest pain Diabetes Hypertension Obstructive sleep apnea Osteoporosis hyperlipidemia Mild coronary disease Atrial fibrillation Bradycardia with complete heart block Status post permanent pacemaker History of cardiopulmonary arrest Recent STEMI with no obstructive coronary disease . Plan Recommend conservative medical therapy Do not  recommend repeat cardiac catheter Consider functional study would recommend done as an outpatient Agree with levothyroxine for thyroid disease Continue Lipitor for lipid management Has patient follow-up with Dr. Ubaldo Glassing as an outpatient  Clayton Green 03/02/2017, 5:14 PM

## 2017-03-02 NOTE — Progress Notes (Signed)
OT Cancellation Note  Patient Details Name: Clayton Grebernest J Cafarella Jr. MRN: [id number] DOB: 03/05/1951   Cancelled Treatment:    Reason Eval/Treat Not Completed: Patient at procedure or test/ unavailable. Order received, chart reviewed. Pt off the floor for testing (MRI). Will follow acutely and re-attempt OT evaluation at later date/time as pt is available.  Richrd PrimeJamie Stiller, MPH, MS, OTR/L ascom [phone] 03/02/17, 3:01 PM

## 2017-03-02 NOTE — Progress Notes (Signed)
PT Cancellation Note  Patient Details Name: Clayton Green J Mericle Jr. MRN: [id number] DOB: 09/08/1950   Cancelled Treatment:    Reason Eval/Treat Not Completed: Patient at procedure or test/unavailable (Pt currently off floor for MRI).  Will continue to follow acutely.   Encarnacion ChuAshley Linden Mikes PT, DPT 03/02/2017, 2:49 PM

## 2017-03-02 NOTE — Consult Note (Signed)
ANTICOAGULATION CONSULT NOTE - Initial Consult  Pharmacy Consult for heparin drip Indication: chest pain/ACS  Allergies  Allergen Reactions  . Shellfish Allergy Nausea And Vomiting    Patient Measurements: Height: 5\' 7"  (170.2 cm) Weight: 250 lb (113.4 kg) IBW/kg (Calculated) : 66.1 Heparin Dosing Weight: 91.9kg (based off of pt estimated weight of 250lb-bed in ER does not work)  Vital Signs: Temp: 99.5 F (37.5 C) (07/16 1829) Temp Source: Oral (07/16 1829) BP: 125/71 (07/16 1829) Pulse Rate: 97 (07/16 1829)  Labs:  Recent Labs  03/01/17 1353 03/01/17 1727 03/01/17 2017 03/01/17 2357  HGB 13.9  --   --  11.8*  HCT 41.5  --   --  35.4*  PLT 204  --   --  173  APTT 27  --   --   --   LABPROT 15.9*  --   --   --   INR 1.26  --   --   --   HEPARINUNFRC  --   --   --  0.12*  CREATININE 1.87*  --   --   --   CKTOTAL 4,083*  --   --   --   TROPONINI 3.50* 5.67* 6.88* 8.27*    Estimated Creatinine Clearance: 46.7 mL/min (A) (by C-G formula based on SCr of 1.87 mg/dL (H)).   Medical History: Past Medical History:  Diagnosis Date  . Acquired hypothyroidism    a. s/p thyroidectomy.  . Arthritis   . Depression   . Diabetes mellitus without complication ([facility])   . GERD (gastroesophageal reflux disease)   . Hyperlipidemia   . Hypertension   . Vitamin D deficiency     Medications:  Scheduled:  . aspirin EC  81 mg Oral Daily  . atorvastatin  20 mg Oral Daily  . cholecalciferol  2,000 Units Oral Daily  . famotidine  10 mg Oral BID  . heparin  3,500 Units Intravenous Once  . levothyroxine  176 mcg Oral QAC breakfast  . PARoxetine  30 mg Oral Daily  . traZODone  50 mg Oral QHS    Assessment: Pt is a 66 year old male who presents s/p fall last night. Pt has been on the floor all night. Pt has an elevated troponin 3.5. Pt denies any anticoagulation use. Pharmacy consulted to dose heparin drip. Baseline APTT and INR added onto labs.  Goal of Therapy:  Heparin  level 0.3-0.7 units/ml Monitor platelets by anticoagulation protocol: Yes   Plan:  Give 4000 units bolus x 1 Start heparin infusion at 1100 units/hr Check anti-Xa level in 6 hours and daily while on heparin Continue to monitor H&H and platelets  Follow up on accurate weight when pt gets to the floor  7/17 @ 0000 HL 0.12 subtherapeutic. Will rebolus w/ heparin 3500 units IV x 1 and increase rate to 1300 units/hr and will recheck Hl @ 0800. CBC trending down.  Thomasene Rippleavid Ved Martos, PharmD, BCPS Clinical Pharmacist 03/02/2017

## 2017-03-02 NOTE — Progress Notes (Signed)
Pharmacy notified RN to increase heparin rate to 6515ml/hr with a 1400 unit bolus. Shanda BumpsJessica RN verified dosing. I will continue to assess.

## 2017-03-02 NOTE — Consult Note (Signed)
Reason for Consult:elevated troponin / rhabdo Referring Physician: Dr. Anselm Jungling hospitalist, Dr Parks Ranger primary  Courtney Paris Jeorge Reister. is an 66 y.o. male.  HPI: patient's 66 year old male with history of hypothyroidism and depression diabetes hyperlipidemia hypertension vitamin D deficiency as an apartment reportedly had a fall wasn't able to get up later on the floor for a day and a half finally some neighbors found him and called EMS. Patient denied syncope or blackout spells complaining of pain in the hip area denies any chest pain and was found to have elevated troponin as well as rhabdomyolysis so he was admitted and cardiology was then consulted. Denies any previous cardiac history  Past Medical History:  Diagnosis Date  . Acquired hypothyroidism    a. s/p thyroidectomy.  . Arthritis   . Depression   . Diabetes mellitus without complication ([place])   . GERD (gastroesophageal reflux disease)   . Hyperlipidemia   . Hypertension   . Vitamin D deficiency     Past Surgical History:  Procedure Laterality Date  . colonscopy    . THYROIDECTOMY, PARTIAL      Family History  Problem Relation Age of Onset  . Diabetes Mother   . Cancer Father     Social History:  reports that he has never smoked. He has never used smokeless tobacco. He reports that he does not drink alcohol or use drugs.  Allergies:  Allergies  Allergen Reactions  . Shellfish Allergy Nausea And Vomiting    Medications: I have reviewed the patient's current medications.  Results for orders placed or performed during the hospital encounter of 03/01/17 (from the past 48 hour(s))  Basic metabolic panel     Status: Abnormal   Collection Time: 03/01/17  1:53 PM  Result Value Ref Range   Sodium 137 135 - 145 mmol/L   Potassium 4.6 3.5 - 5.1 mmol/L   Chloride 99 (L) 101 - 111 mmol/L   CO2 21 (L) 22 - 32 mmol/L   Glucose, Bld 147 (H) 65 - 99 mg/dL   BUN 42 (H) 6 - 20 mg/dL   Creatinine, Ser 1.87 (H) 0.61 - 1.24  mg/dL   Calcium 9.4 8.9 - 10.3 mg/dL   GFR calc non Af Amer 36 (L) >60 mL/min   GFR calc Af Amer 42 (L) >60 mL/min    Comment: (NOTE) The eGFR has been calculated using the CKD EPI equation. This calculation has not been validated in all clinical situations. eGFR's persistently <60 mL/min signify possible Chronic Kidney Disease.    Anion gap 17 (H) 5 - 15  Hepatic function panel     Status: Abnormal   Collection Time: 03/01/17  1:53 PM  Result Value Ref Range   Total Protein 8.3 (H) 6.5 - 8.1 g/dL   Albumin 4.1 3.5 - 5.0 g/dL   AST 89 (H) 15 - 41 U/L   ALT 25 17 - 63 U/L   Alkaline Phosphatase 137 (H) 38 - 126 U/L   Total Bilirubin 1.6 (H) 0.3 - 1.2 mg/dL   Bilirubin, Direct 0.2 0.1 - 0.5 mg/dL   Indirect Bilirubin 1.4 (H) 0.3 - 0.9 mg/dL  Troponin I     Status: Abnormal   Collection Time: 03/01/17  1:53 PM  Result Value Ref Range   Troponin I 3.50 (HH) <0.03 ng/mL    Comment: CRITICAL RESULT CALLED TO, READ BACK BY AND VERIFIED WITH KATE BUMGARNER ON 03/01/17 AT 1444 QSD   CBC with Differential     Status: Abnormal  Collection Time: 03/01/17  1:53 PM  Result Value Ref Range   WBC 17.0 (H) 3.8 - 10.6 K/uL   RBC 4.80 4.40 - 5.90 MIL/uL   Hemoglobin 13.9 13.0 - 18.0 g/dL   HCT 41.5 40.0 - 52.0 %   MCV 86.4 80.0 - 100.0 fL   MCH 28.9 26.0 - 34.0 pg   MCHC 33.4 32.0 - 36.0 g/dL   RDW 15.3 (H) 11.5 - 14.5 %   Platelets 204 150 - 440 K/uL   Neutrophils Relative % 91 %   Neutro Abs 15.5 (H) 1.4 - 6.5 K/uL   Lymphocytes Relative 4 %   Lymphs Abs 0.6 (L) 1.0 - 3.6 K/uL   Monocytes Relative 5 %   Monocytes Absolute 0.8 0.2 - 1.0 K/uL   Eosinophils Relative 0 %   Eosinophils Absolute 0.0 0 - 0.7 K/uL   Basophils Relative 0 %   Basophils Absolute 0.1 0 - 0.1 K/uL  CK     Status: Abnormal   Collection Time: 03/01/17  1:53 PM  Result Value Ref Range   Total CK 4,083 (H) 49 - 397 U/L  APTT     Status: None   Collection Time: 03/01/17  1:53 PM  Result Value Ref Range   aPTT  27 24 - 36 seconds  Protime-INR     Status: Abnormal   Collection Time: 03/01/17  1:53 PM  Result Value Ref Range   Prothrombin Time 15.9 (H) 11.4 - 15.2 seconds   INR 1.26   Lipid panel     Status: Abnormal   Collection Time: 03/01/17  5:27 PM  Result Value Ref Range   Cholesterol 169 0 - 200 mg/dL   Triglycerides 162 (H) <150 mg/dL   HDL 51 >40 mg/dL   Total CHOL/HDL Ratio 3.3 RATIO   VLDL 32 0 - 40 mg/dL   LDL Cholesterol 86 0 - 99 mg/dL    Comment:        Total Cholesterol/HDL:CHD Risk Coronary Heart Disease Risk Table                     Men   Women  1/2 Average Risk   3.4   3.3  Average Risk       5.0   4.4  2 X Average Risk   9.6   7.1  3 X Average Risk  23.4   11.0        Use the calculated Patient Ratio above and the CHD Risk Table to determine the patient's CHD Risk.        ATP III CLASSIFICATION (LDL):  <100     mg/dL   Optimal  100-129  mg/dL   Near or Above                    Optimal  130-159  mg/dL   Borderline  160-189  mg/dL   High  >190     mg/dL   Very High   Troponin I     Status: Abnormal   Collection Time: 03/01/17  5:27 PM  Result Value Ref Range   Troponin I 5.67 (HH) <0.03 ng/mL    Comment: CRITICAL VALUE NOTED. VALUE IS CONSISTENT WITH PREVIOUSLY REPORTED/CALLED VALUE [place]  Glucose, capillary     Status: Abnormal   Collection Time: 03/01/17  6:37 PM  Result Value Ref Range   Glucose-Capillary 117 (H) 65 - 99 mg/dL  Urinalysis, Complete w Microscopic     Status:  Abnormal   Collection Time: 03/01/17  7:13 PM  Result Value Ref Range   Color, Urine YELLOW (A) YELLOW   APPearance HAZY (A) CLEAR   Specific Gravity, Urine 1.019 1.005 - 1.030   pH 5.0 5.0 - 8.0   Glucose, UA NEGATIVE NEGATIVE mg/dL   Hgb urine dipstick LARGE (A) NEGATIVE   Bilirubin Urine NEGATIVE NEGATIVE   Ketones, ur 20 (A) NEGATIVE mg/dL   Protein, ur 100 (A) NEGATIVE mg/dL   Nitrite NEGATIVE NEGATIVE   Leukocytes, UA NEGATIVE NEGATIVE   RBC / HPF 0-5 0 - 5 RBC/hpf    WBC, UA 0-5 0 - 5 WBC/hpf   Bacteria, UA RARE (A) NONE SEEN   Squamous Epithelial / LPF 0-5 (A) NONE SEEN   Mucous PRESENT   Troponin I     Status: Abnormal   Collection Time: 03/01/17  8:17 PM  Result Value Ref Range   Troponin I 6.88 (HH) <0.03 ng/mL    Comment: CRITICAL RESULT CALLED TO, READ BACK BY AND VERIFIED WITH [place]  Troponin I     Status: Abnormal   Collection Time: 03/01/17 11:57 PM  Result Value Ref Range   Troponin I 8.27 (HH) <0.03 ng/mL    Comment: CRITICAL VALUE NOTED. VALUE IS CONSISTENT WITH PREVIOUSLY REPORTED/CALLED VALUE [facility]  Basic metabolic panel     Status: Abnormal   Collection Time: 03/01/17 11:57 PM  Result Value Ref Range   Sodium 136 135 - 145 mmol/L   Potassium 3.8 3.5 - 5.1 mmol/L   Chloride 106 101 - 111 mmol/L   CO2 22 22 - 32 mmol/L   Glucose, Bld 107 (H) 65 - 99 mg/dL   BUN 38 (H) 6 - 20 mg/dL   Creatinine, Ser 1.45 (H) 0.61 - 1.24 mg/dL   Calcium 8.2 (L) 8.9 - 10.3 mg/dL   GFR calc non Af Amer 49 (L) >60 mL/min   GFR calc Af Amer 56 (L) >60 mL/min    Comment: (NOTE) The eGFR has been calculated using the CKD EPI equation. This calculation has not been validated in all clinical situations. eGFR's persistently <60 mL/min signify possible Chronic Kidney Disease.    Anion gap 8 5 - 15  CK     Status: Abnormal   Collection Time: 03/01/17 11:57 PM  Result Value Ref Range   Total CK 5,019 (H) 49 - 397 U/L    Comment: RESULT CONFIRMED BY MANUAL DILUTION [place]  Heparin level (unfractionated)     Status: Abnormal   Collection Time: 03/01/17 11:57 PM  Result Value Ref Range   Heparin Unfractionated 0.12 (L) 0.30 - 0.70 IU/mL    Comment:        IF HEPARIN RESULTS ARE BELOW EXPECTED VALUES, AND PATIENT DOSAGE HAS BEEN CONFIRMED, SUGGEST FOLLOW UP TESTING OF ANTITHROMBIN III LEVELS.   CBC     Status: Abnormal   Collection Time: 03/01/17 11:57 PM  Result Value Ref Range   WBC 12.9 (H) 3.8 - 10.6 K/uL   RBC 4.14 (L) 4.40 - 5.90 MIL/uL    Hemoglobin 11.8 (L) 13.0 - 18.0 g/dL   HCT 35.4 (L) 40.0 - 52.0 %   MCV 85.4 80.0 - 100.0 fL   MCH 28.5 26.0 - 34.0 pg   MCHC 33.4 32.0 - 36.0 g/dL   RDW 15.6 (H) 11.5 - 14.5 %   Platelets 173 150 - 440 K/uL  Troponin I     Status: Abnormal   Collection Time: 03/02/17  5:59 AM  Result Value Ref Range   Troponin I 7.87 (HH) <0.03 ng/mL    Comment: CRITICAL VALUE NOTED. VALUE IS CONSISTENT WITH PREVIOUSLY REPORTED/CALLED VALUE.Marland KitchenHKP  Heparin level (unfractionated)     Status: Abnormal   Collection Time: 03/02/17  8:17 AM  Result Value Ref Range   Heparin Unfractionated 0.22 (L) 0.30 - 0.70 IU/mL    Comment:        IF HEPARIN RESULTS ARE BELOW EXPECTED VALUES, AND PATIENT DOSAGE HAS BEEN CONFIRMED, SUGGEST FOLLOW UP TESTING OF ANTITHROMBIN III LEVELS.   Troponin I     Status: Abnormal   Collection Time: 03/02/17 10:57 AM  Result Value Ref Range   Troponin I 6.61 (HH) <0.03 ng/mL    Comment: CRITICAL VALUE NOTED. VALUE IS CONSISTENT WITH PREVIOUSLY REPORTED/CALLED VALUE.Marland KitchenHKP    Ct Hip Right Wo Contrast  Result Date: 03/01/2017 CLINICAL DATA:  Patient was found down. Right lower extremity deformity. EXAM: CT OF THE RIGHT HIP WITHOUT CONTRAST TECHNIQUE: Multidetector CT imaging of the right hip was performed according to the standard protocol. Multiplanar CT image reconstructions were also generated. COMPARISON:  Radiographs dated 03/01/2017 FINDINGS: Bones/Joint/Cartilage There is no fracture or dislocation. 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 There is diffuse marked narrowing of the joint space with numerous erosions of the femoral head and acetabulum. However, there is no acute abnormality. No appreciable soft tissue hematoma. IMPRESSION: 1. No acute fracture or dislocation. 2. 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 Electronically Signed   By: Lorriane Shire M.D.   On: 03/01/2017 17:13   Dg Chest Port 1  View  Result Date: 03/01/2017 CLINICAL DATA:  Pain following fall.  Hypertension. EXAM: PORTABLE CHEST 1 VIEW COMPARISON:  June 21, 2016 FINDINGS: There is mild right base atelectasis. Lungs elsewhere are clear. The heart size and pulmonary vascularity are normal. Aorta is mildly prominent but stable. No adenopathy. Bones are osteoporotic. IMPRESSION: Mild right base atelectasis. No edema or consolidation. Stable cardiac silhouette. Aortic prominence likely is indicative of chronic hypertension. Electronically Signed   By: Lowella Grip III M.D.   On: 03/01/2017 14:58   Dg Hip Unilat With Pelvis 2-3 Views Right  Result Date: 03/01/2017 CLINICAL DATA:  Fall with right lower extremity foreshortened and externally rotated EXAM: DG HIP (WITH OR WITHOUT PELVIS) 2-3V RIGHT COMPARISON:  None. FINDINGS: Frontal pelvis as well as frontal and lateral right hip images obtained. A well-defined fracture is not appreciable on this examination. There is suggestion of a subcapital femoral neck fracture on the frontal view. 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 No demonstrable dislocation. There is mild narrowing of the left hip joint. There is underlying osteoporosis. IMPRESSION: A well-defined fracture is not appreciable by radiography. There is a suggestion of potential impaction in the subcapital femoral region. There is no other evidence suggesting potential fracture. No dislocation. 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 Bones osteoporotic.  Mild narrowing left hip joint. Given this circumstance, MR of the pelvis and right hip could be extremely helpful both to assess for potential impaction in the subcapital femoral neck region as well as to assess for avascular necrosis.  Electronically Signed   By: Lowella Grip III M.D.   On: 03/01/2017 14:57  Review of Systems  Constitutional: Positive for malaise/fatigue.  HENT: Negative.   Eyes: Negative.   Respiratory: Negative.   Cardiovascular: Negative.   Gastrointestinal: Negative.   Genitourinary: Negative.   Skin: Negative.   Neurological: Positive for dizziness and weakness.  Endo/Heme/Allergies: Negative.   Psychiatric/Behavioral: Negative.    Blood pressure 119/64, pulse 93, temperature 99 F (37.2 C), temperature source Oral, resp. rate 18, height 5' 7"  (1.702 m), weight 113.4 kg (250 lb), SpO2 100 %. Physical Exam  Nursing note and vitals reviewed. Constitutional: He is oriented to person, place, and time. He appears well-developed and well-nourished.  HENT:  Head: Normocephalic and atraumatic.  Eyes: Pupils are equal, round, and reactive to light. Conjunctivae and EOM are normal.  Neck: Normal range of motion. Neck supple.  Cardiovascular: Normal rate, regular rhythm and normal heart sounds.   Respiratory: Effort normal and breath sounds normal.  GI: Soft. Bowel sounds are normal.  Musculoskeletal: Normal range of motion.  Neurological: He is alert and oriented to person, place, and time. He has normal reflexes.  Skin: Skin is warm and dry.  Psychiatric: He has a normal mood and affect.    Assessment/Plan: Demand ischemia Borderline troponins Rhabdo Fall Arthritis Depression Acute on chronic renal insufficiency Elevated white count . Plan Recommend hydration Agree with echocardiogram for assessment of LV function Follow-up troponins probably demand ischemia Continue therapy for heart failure and renal insuffcy Short-term anticoagulation Recommend physical therapy Do not recommend cardiac catheter invasive strategy   Rudine Rieger D Evelin Cake 03/02/2017, 5:03 PM

## 2017-03-02 NOTE — Care Management (Addendum)
CM screen due to circumstances surrounding admission.  Patient fell at home 7/15 around 7pm and not able to get up.  He was found 7/16 afternoon.  Patient has been in his current apartment for 7 months and followed by [facility].  Says he does have a medic alert but has one to be installed.   Says 'they finally came in and found me after I started hollering for help."  Says he does not "have any help or  home health services in the home.  He is not able to state the specific assistance that is provided by [facility]. Consults for PT and OT are pending.  Patient has ruled in for nstemi. On heparin drip and cardiology consult pending

## 2017-03-02 NOTE — Consult Note (Signed)
ANTICOAGULATION CONSULT NOTE - Initial Consult  Pharmacy Consult for heparin drip Indication: chest pain/ACS  Allergies  Allergen Reactions  . Shellfish Allergy Nausea And Vomiting    Patient Measurements: Height: 5\' 7"  (170.2 cm) Weight: 250 lb (113.4 kg) IBW/kg (Calculated) : 66.1 Heparin Dosing Weight: 91.9kg (based off of pt estimated weight of 250lb-bed in ER does not work)  Vital Signs:    Labs:  Recent Labs  03/01/17 1353  03/01/17 2357 03/02/17 0559 03/02/17 0817 03/02/17 1057 03/02/17 1745 03/02/17 1847  HGB 13.9  --  11.8*  --   --   --   --   --   HCT 41.5  --  35.4*  --   --   --   --   --   PLT 204  --  173  --   --   --   --   --   APTT 27  --   --   --   --   --   --   --   LABPROT 15.9*  --   --   --   --   --   --   --   INR 1.26  --   --   --   --   --   --   --   HEPARINUNFRC  --   --  0.12*  --  0.22*  --   --  0.15*  CREATININE 1.87*  --  1.45*  --   --   --   --   --   CKTOTAL 4,083*  --  5,019*  --   --   --   --   --   TROPONINI 3.50*  < > 8.27* 7.87*  --  6.61* 4.75*  --   < > = values in this interval not displayed.  Estimated Creatinine Clearance: 60.2 mL/min (A) (by C-G formula based on SCr of 1.45 mg/dL (H)).   Medical History: Past Medical History:  Diagnosis Date  . Acquired hypothyroidism    a. s/p thyroidectomy.  . Arthritis   . Depression   . Diabetes mellitus without complication (HCC)   . GERD (gastroesophageal reflux disease)   . Hyperlipidemia   . Hypertension   . Vitamin D deficiency     Medications:  Scheduled:  . aspirin EC  81 mg Oral Daily  . cholecalciferol  2,000 Units Oral Daily  . famotidine  10 mg Oral BID  . heparin  2,750 Units Intravenous Once  . levothyroxine  176 mcg Oral QAC breakfast  . PARoxetine  30 mg Oral Daily  . traZODone  50 mg Oral QHS    Assessment: Pt is a 66 year old male who presents s/p fall last night. Pt has been on the floor all night. Pt has an elevated troponin 3.5. Pt  denies any anticoagulation use. Pharmacy consulted to dose heparin drip. Baseline APTT and INR added onto labs.  Goal of Therapy:  Heparin level 0.3-0.7 units/ml Monitor platelets by anticoagulation protocol: Yes   Plan:  Give 4000 units bolus x 1 Start heparin infusion at 1100 units/hr Check anti-Xa level in 6 hours and daily while on heparin Continue to monitor H&H and platelets  Follow up on accurate weight when pt gets to the floor  7/17 @ 0000 HL 0.12 subtherapeutic. Will rebolus w/ heparin 3500 units IV x 1 and increase rate to 1300 units/hr and will recheck Hl @ 0800. CBC trending down.  7/17 0817 HL  subtherapeutic. 1400 units IV x 1 bolus and increase rate to 1500 units/hr. Will recheck HL in 6 hours.  7/17 1900 HL subtherapeutic at 0.15. Per RN drip was not turned off. Bolus 2750 units and increase rate to 1800 units/hr. Recheck level in 6 hours.  Olene Floss, Pharm.D, BCPS Clinical Pharmacist  03/02/2017

## 2017-03-03 LAB — CBC
HCT: 34 % — ABNORMAL LOW (ref 40.0–52.0)
Hemoglobin: 11.5 g/dL — ABNORMAL LOW (ref 13.0–18.0)
MCH: 29.2 pg (ref 26.0–34.0)
MCHC: 33.6 g/dL (ref 32.0–36.0)
MCV: 86.9 fL (ref 80.0–100.0)
PLATELETS: 160 10*3/uL (ref 150–440)
RBC: 3.92 MIL/uL — AB (ref 4.40–5.90)
RDW: 15.5 % — ABNORMAL HIGH (ref 11.5–14.5)
WBC: 9 10*3/uL (ref 3.8–10.6)

## 2017-03-03 LAB — HEPARIN LEVEL (UNFRACTIONATED)
HEPARIN UNFRACTIONATED: 0.35 [IU]/mL (ref 0.30–0.70)
Heparin Unfractionated: 0.37 IU/mL (ref 0.30–0.70)

## 2017-03-03 LAB — BASIC METABOLIC PANEL
ANION GAP: 9 (ref 5–15)
BUN: 30 mg/dL — ABNORMAL HIGH (ref 6–20)
CO2: 22 mmol/L (ref 22–32)
Calcium: 8.1 mg/dL — ABNORMAL LOW (ref 8.9–10.3)
Chloride: 107 mmol/L (ref 101–111)
Creatinine, Ser: 1.16 mg/dL (ref 0.61–1.24)
GFR calc Af Amer: >60 mL/min (ref >60)
Glucose, Bld: 113 mg/dL — ABNORMAL HIGH (ref 65–99)
POTASSIUM: 3.6 mmol/L (ref 3.5–5.1)
SODIUM: 138 mmol/L (ref 135–145)

## 2017-03-03 LAB — CK: CK TOTAL: 1719 U/L — AB (ref 49–397)

## 2017-03-03 MED ORDER — SODIUM CHLORIDE 0.9 % IV SOLN
INTRAVENOUS | Status: DC
  Administered 2017-03-03 (×2): via INTRAVENOUS

## 2017-03-03 NOTE — NC FL2 (Addendum)
[place] MEDICAID FL2 LEVEL OF CARE SCREENING TOOL     IDENTIFICATION  Patient Name: Clayton Green. Birthdate: April 01, 1951 Sex: male Admission Date (Current Location): 03/01/2017  [place] and [place] Number:  [address] [id number] R Facility and Address:  [facility], [address] [place], [place] [address]      Provider Number: [id number]  Attending Physician Name and Address:  Auburn Bilberry, MD  Relative Name and Phone Number:  Lamar Laundry [phone]  or Pulliam,Quentin Other   [phone] or Francia Greaves    [phone]     Current Level of Care: Hospital Recommended Level of Care: Skilled Nursing Facility Prior Approval Number:    Date Approved/Denied:   PASRR Number: Pending Discharge Plan: SNF    Current Diagnoses: Patient Active Problem List   Diagnosis Date Noted  . NSTEMI (non-ST elevated myocardial infarction) (HCC) 03/01/2017  . Hyperlipidemia associated with type 2 diabetes mellitus (HCC) 09/11/2016  . Chronic low back pain 09/11/2016  . Degenerative joint disease (DJD) of lumbar spine 09/11/2016  . GERD (gastroesophageal reflux disease) 08/12/2016  . Chronic venous insufficiency 08/05/2016  . Elevated serum creatinine 08/05/2016  . Biliary colic 06/21/2016  . Cholelithiasis 06/21/2016  . 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 Vitamin D deficiency 03/28/2015  . 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 Essential hypertension 03/28/2015    Orientation RESPIRATION BLADDER Height & Weight     Self, Time, Situation, Place  Normal Continent Weight: 250 lb (113.4 kg) Height:  5\' 7"  (170.2 cm)  BEHAVIORAL SYMPTOMS/MOOD NEUROLOGICAL BOWEL NUTRITION STATUS      Continent Diet (Regular diet)  AMBULATORY STATUS COMMUNICATION OF NEEDS Skin   Limited Assist Verbally Normal                       Personal  Care Assistance Level of Assistance  Bathing, Feeding, Dressing Bathing Assistance: Limited assistance Feeding assistance: Independent Dressing Assistance: Limited assistance     Functional Limitations Info  Sight, Hearing, Speech Sight Info: Adequate Hearing Info: Adequate Speech Info: Adequate    SPECIAL CARE FACTORS FREQUENCY  PT (By licensed PT)     PT Frequency: 5x a week              Contractures Contractures Info: Not present    Additional Factors Info  Code Status, Allergies, Psychotropic Code Status Info: Full Code Allergies Info: SHELLFISH ALLERGY  Psychotropic Info: PARoxetine (PAXIL) tablet 30 mg and traZODone (DESYREL) tablet 50 mg         Current Medications (03/03/2017):  This is the current hospital active medication list Current Facility-Administered Medications  Medication Dose Route Frequency Provider Last Rate Last Dose  . 0.9 %  sodium chloride infusion   Intravenous Continuous Auburn Bilberry, MD 100 mL/hr at 03/03/17 1224    . acetaminophen (TYLENOL) tablet 500 mg  500 mg Oral Q6H PRN Altamese Dilling, MD   500 mg at 03/02/17 0450  . aspirin EC tablet 81 mg  81 mg Oral Daily Altamese Dilling, MD   81 mg at 03/03/17 0929  . cholecalciferol (VITAMIN D) tablet 2,000 Units  2,000 Units Oral Daily Altamese Dilling, MD   2,000 Units at 03/03/17 0931  . docusate sodium (COLACE) capsule 100 mg  100 mg Oral BID PRN Altamese Dilling, MD      . famotidine (PEPCID) tablet 10 mg  10 mg Oral BID Altamese Dilling, MD  10 mg at 03/03/17 0928  . levothyroxine (SYNTHROID, LEVOTHROID) tablet 176 mcg  176 mcg Oral QAC breakfast Altamese DillingVachhani, Vaibhavkumar, MD   176 mcg at 03/03/17 0930  . oxyCODONE-acetaminophen (PERCOCET/ROXICET) 5-325 MG per tablet 1-2 tablet  1-2 tablet Oral Q4H PRN Auburn BilberryPatel, Shreyang, MD   2 tablet at 03/03/17 1051  . PARoxetine (PAXIL) tablet 30 mg  30 mg Oral Daily Altamese DillingVachhani, Vaibhavkumar, MD   30 mg at 03/03/17 0928  .  traZODone (DESYREL) tablet 50 mg  50 mg Oral Lamont SnowballQHS Willis, David, MD   50 mg at 03/02/17 2007     Discharge Medications: Please see discharge summary for a list of discharge medications.  Relevant Imaging Results:  Relevant Lab Results:   Additional Information SSN [id number]  Darleene Cleavernterhaus, Red Mandt R, [place]CSWA

## 2017-03-03 NOTE — Evaluation (Signed)
Physical Therapy Evaluation Patient Details Name: Clayton Green. MRN: [id number] DOB: 18-Aug-1950 Today's Date: 03/03/2017   History of Present Illness  Pt is a 66 y/o M who presented after falling on floor where he stayed all night and part of the next day as he did not have a phone and could not stand up. Pt denies losing consciousness. Pt's PMH includes partial thyroidectomy.    Clinical Impression  Pt admitted with above diagnosis. Pt currently with functional limitations due to the deficits listed below (see PT Problem List). Clayton Green was at a modified independent level of mobility PTA.  He typically transfers WC<>bed without assist and is able to perform ADLs, IADLs from [facility]  He currently requires mod assist for sit<>stand and stand pivot transfer due to instability and BLE weakness.  He demonstrates heavy use of the bed rail with supine>sit and pt does not have a bed rail at home.  Given pt's current mobility status, and given that pt lives alone, recommending SNF at d/c. Pt will benefit from skilled PT to increase their independence and safety with mobility to allow discharge to the venue listed below.      Follow Up Recommendations SNF    Equipment Recommendations  None recommended by PT    Recommendations for Other Services       Precautions / Restrictions Precautions Precautions: Fall Restrictions Weight Bearing Restrictions: No      Mobility  Bed Mobility Overal bed mobility: Needs Assistance Bed Mobility: Supine to Sit     Supine to sit: Min guard;HOB elevated Sit to supine: Supervision   General bed mobility comments: Pt requires heavy use of bed rail to pull up to sitting with increased time and effort.   Transfers Overall transfer level: Needs assistance Equipment used: 1 person hand held assist Transfers: Sit to/from [employer] Sit to Stand: Mod assist Stand pivot transfers: Mod assist       General transfer comment: Pt requests  assist as he was unable to stand up independently.  Pt pushes heavily through 1 person HHA to boost to standing and to remain steady when pivoting to chair.  Pt maintains flexed posture throughout transfer and reaches for aremrests of chair.  He requires verbal directional cues to know when it is appropriate for him to sit.  Ambulation/Gait             General Gait Details: Pt has been non ambulatory for several years per pt  Stairs            Wheelchair Mobility    Modified Rankin (Stroke Patients Only)       Balance Overall balance assessment: Needs assistance;History of Falls Sitting-balance support: Feet supported;Single extremity supported Sitting balance-Leahy Scale: Poor Sitting balance - Comments: Pt requires at least 1UE support while seated EOB Postural control: Right lateral lean Standing balance support: Bilateral upper extremity supported;During functional activity Standing balance-Leahy Scale: Poor Standing balance comment: Pt relies on UE support for static and dynamic activities                             Pertinent Vitals/Pain Pain Assessment: 0-10 Pain Score: 8  Pain Location: BLE Pain Descriptors / Indicators: Aching Pain Intervention(s): Limited activity within patient's tolerance;Monitored during session    Home Living Family/patient expects to be discharged to:: Private residence Living Arrangements: Alone Available Help at Discharge: Other (Comment) ([facility] ) Type of Home: Apartment (handicap  accessible) Home Access: Level entry     Home Layout: One level Home Equipment: Walker - 2 wheels;Wheelchair - manual;Adaptive equipment;Grab bars - toilet;Hospital bed Additional Comments: [facility] come 2x/wk.  They take pt to store, run errands.      Prior Function Level of Independence: Needs assistance   Gait / Transfers Assistance Needed: indep from w/c level and performs SPT transfers to toilet and  hospital bed.  Does not use walker to perform SPT.  Pt reports it has been several years since he ambulated.  ADL's / Homemaking Assistance Needed: indep from w/c level with meal prep, med mgt, and household mgt tasks; Horticulturist, commercial assists with transportation for dr appts, groceries, bank, etc.; Pt reports that [employer] his bills  Comments: Pt reports that a family friend is his HCPOA; recent fall leading to this admission, unable to get up after attempting transfer out of w/c while dizzy     Hand Dominance   Dominant Hand: Left    Extremity/Trunk Assessment   Upper Extremity Assessment Upper Extremity Assessment: Defer to OT evaluation    Lower Extremity Assessment Lower Extremity Assessment: Generalized weakness (BLE strength grossly 3-/5)    Cervical / Trunk Assessment Cervical / Trunk Assessment: Normal  Communication   Communication: No difficulties  Cognition Arousal/Alertness: Awake/alert Behavior During Therapy: WFL for tasks assessed/performed Overall Cognitive Status: No family/caregiver present to determine baseline cognitive functioning                                 General Comments: Suspect early signs of dementia.  Pt alert and oriented, follows simple commands, able to answer basic safety questions; very talkative giving extraneous details when asked direct questions at times, requires verbal cues to redirect      General Comments General comments (skin integrity, edema, etc.): scabs noted on BLE, L>R    Exercises General Exercises - Upper Extremity Shoulder Flexion: AROM;Both;10 reps;Supine General Exercises - Lower Extremity Ankle Circles/Pumps: AROM;Both;10 reps;Supine;Seated Long Arc Quad: AROM;Both;10 reps;Seated Hip Flexion/Marching: AROM;Both;10 reps;Seated;Other (comment) (challenging for the pt)   Assessment/Plan    PT Assessment Patient needs continued PT services  PT Problem List Decreased  strength;Decreased activity tolerance;Decreased balance;Decreased range of motion;Decreased mobility;Decreased cognition;Decreased knowledge of use of DME;Decreased safety awareness;Pain;Obesity       PT Treatment Interventions DME instruction;Functional mobility training;Therapeutic activities;Balance training;Therapeutic exercise;Neuromuscular re-education;Cognitive remediation;Patient/family education;Wheelchair mobility training    PT Goals (Current goals can be found in the Care Plan section)  Acute Rehab PT Goals Patient Stated Goal: get stronger so I can go home PT Goal Formulation: With patient Time For Goal Achievement: 03/17/17 Potential to Achieve Goals: Good    Frequency Min 2X/week   Barriers to discharge Decreased caregiver support Lives alone    Co-evaluation               AM-PAC PT "6 Clicks" Daily Activity  Outcome Measure Difficulty turning over in bed (including adjusting bedclothes, sheets and blankets)?: Total Difficulty moving from lying on back to sitting on the side of the bed? : Total Difficulty sitting down on and standing up from a chair with arms (e.g., wheelchair, bedside commode, etc,.)?: Total Help needed moving to and from a bed to chair (including a wheelchair)?: A Lot Help needed walking in hospital room?: Total Help needed climbing 3-5 steps with a railing? : Total 6 Click Score: 7    End of Session  Equipment Utilized During Treatment: Gait belt Activity Tolerance: Patient tolerated treatment well;Patient limited by fatigue Patient left: in chair;with call bell/phone within reach;with chair alarm set Nurse Communication: Mobility status PT Visit Diagnosis: Muscle weakness (generalized) (M62.81);Unsteadiness on feet (R26.81);History of falling (Z91.81)    Time: [id number] PT Time Calculation (min) (ACUTE ONLY): 35 min   Charges:   PT Evaluation $PT Eval Low Complexity: 1 Procedure PT Treatments $Therapeutic Activity: 8-22 mins    PT G Codes:        Encarnacion ChuAshley Cambrie Sonnenfeld PT, DPT 03/03/2017, 11:58 AM

## 2017-03-03 NOTE — Progress Notes (Signed)
[facility] [place] [facility]                                                                                                                                                                                  Patient Demographics   Clayton Green, is a 66 y.o. male, DOB - Jul 24, 1951, IHK:[id number]  Admit date - 03/01/2017   Admitting Physician Altamese Dilling, MD  Outpatient Primary MD for the patient is Smitty Cords, DO   LOS - 2  Subjective: Pt is Patient is very weak denies any chest pain or shortness of breath    Review of Systems:   CONSTITUTIONAL: No documented fever.Positive fatigue, positive weakness. No weight gain, no weight loss.  EYES: No blurry or double vision.  ENT: No tinnitus. No postnasal drip. No redness of the oropharynx.  RESPIRATORY: No cough, no wheeze, no hemoptysis. No dyspnea.  CARDIOVASCULAR: No chest pain. No orthopnea. No palpitations. No syncope.  GASTROINTESTINAL: No nausea, no vomiting or diarrhea. No abdominal pain. No melena or hematochezia.  GENITOURINARY: No dysuria or hematuria.  ENDOCRINE: No polyuria or nocturia. No heat or cold intolerance.  HEMATOLOGY: No anemia. No bruising. No bleeding.  INTEGUMENTARY: No rashes. No lesions.  MUSCULOSKELETAL: No arthritis. No swelling. No gout.  NEUROLOGIC: No numbness, tingling, or ataxia. No seizure-type activity.  PSYCHIATRIC: No anxiety. No insomnia. No ADD.    Vitals:   Vitals:   03/02/17 2006 03/03/17 0302 03/03/17 0826 03/03/17 1340  BP: 127/71 114/60 120/65 (!) 104/58  Pulse: 80 76 66 70  Resp: 16 18  18   Temp: 98.8 F (37.1 C) 98.5 F (36.9 C) (!) 97.5 F (36.4 C) 98.3 F (36.8 C)  TempSrc: Oral Oral Oral Oral  SpO2: 94% 94% 95% 96%  Weight:      Height:        Wt Readings from Last 3 Encounters:  03/01/17 250 lb (113.4 kg)  06/21/16 264 lb 14.4 oz (120.2 kg)  07/23/15 234 lb 3.2 oz (106.2 kg)     Intake/Output Summary (Last 24 hours) at  03/03/17 1431 Last data filed at 03/03/17 1102  Gross per 24 hour  Intake           1367.3 ml  Output              926 ml  Net            441.3 ml    Physical Exam:   GENERAL: Pleasant-appearing in no apparent distress.  HEAD, EYES, EARS, NOSE AND THROAT: Atraumatic, normocephalic. Extraocular muscles are intact. Pupils equal and reactive to light. Sclerae anicteric. No conjunctival injection. No oro-pharyngeal erythema.  NECK:  Supple. There is no jugular venous distention. No bruits, no lymphadenopathy, no thyromegaly.  HEART: Regular rate and rhythm,. No murmurs, no rubs, no clicks.  LUNGS: Clear to auscultation bilaterally. No rales or rhonchi. No wheezes.  ABDOMEN: Soft, flat, nontender, nondistended. Has good bowel sounds. No hepatosplenomegaly appreciated.  EXTREMITIES: No evidence of any cyanosis, clubbing, or peripheral edema.  +2 pedal and radial pulses bilaterally.  NEUROLOGIC: The patient is alert, awake, and oriented x3 with no focal motor or sensory deficits appreciated bilaterally.  SKIN: Moist and warm with no rashes appreciated.  Psych: Not anxious, depressed LN: No inguinal LN enlargement    Antibiotics   Anti-infectives    None      Medications   Scheduled Meds: . aspirin EC  81 mg Oral Daily  . cholecalciferol  2,000 Units Oral Daily  . famotidine  10 mg Oral BID  . levothyroxine  176 mcg Oral QAC breakfast  . PARoxetine  30 mg Oral Daily  . traZODone  50 mg Oral QHS   Continuous Infusions: . sodium chloride 100 mL/hr at 03/03/17 1224   PRN Meds:.acetaminophen, docusate sodium, oxyCODONE-acetaminophen   Data Review:   Micro Results No results found for this or any previous visit (from the past 240 hour(s)).  Radiology Reports Ct Hip Right Wo Contrast  Result Date: 03/01/2017 CLINICAL DATA:  Patient was found down. Right lower extremity deformity. EXAM: CT OF THE RIGHT HIP WITHOUT CONTRAST TECHNIQUE: Multidetector CT imaging of the right hip was  performed according to the standard protocol. Multiplanar CT image reconstructions were also generated. COMPARISON:  Radiographs dated 03/01/2017 FINDINGS: Bones/Joint/Cartilage There is no fracture or dislocation. 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 There is diffuse marked narrowing of the joint space with numerous erosions of the femoral head and acetabulum. However, there is no acute abnormality. No appreciable soft tissue hematoma. IMPRESSION: 1. No acute fracture or dislocation. 2. 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 Electronically Signed   By: Francene BoyersJames  Maxwell M.D.   On: 03/01/2017 17:13   Dg Chest Port 1 View  Result Date: 03/01/2017 CLINICAL DATA:  Pain following fall.  Hypertension. EXAM: PORTABLE CHEST 1 VIEW COMPARISON:  June 21, 2016 FINDINGS: There is mild right base atelectasis. Lungs elsewhere are clear. The heart size and pulmonary vascularity are normal. Aorta is mildly prominent but stable. No adenopathy. Bones are osteoporotic. IMPRESSION: Mild right base atelectasis. No edema or consolidation. Stable cardiac silhouette. Aortic prominence likely is indicative of chronic hypertension. Electronically Signed   By: Bretta BangWilliam  Woodruff III M.D.   On: 03/01/2017 14:58   Dg Hip Unilat With Pelvis 2-3 Views Right  Result Date: 03/01/2017 CLINICAL DATA:  Fall with right lower extremity foreshortened and externally rotated EXAM: DG HIP (WITH OR WITHOUT PELVIS) 2-3V RIGHT COMPARISON:  None. FINDINGS: Frontal pelvis as well as frontal and lateral right hip images obtained. A well-defined fracture is not appreciable on this examination. There is suggestion of a subcapital femoral neck fracture on the frontal view. 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 No demonstrable dislocation. There is mild  narrowing of the left hip joint. There is underlying osteoporosis. IMPRESSION: A well-defined fracture is not appreciable by radiography. There is a suggestion of potential impaction in the subcapital femoral region. There is no other evidence suggesting potential fracture.  No dislocation. 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 Bones osteoporotic.  Mild narrowing left hip joint. Given this circumstance, MR of the pelvis and right hip could be extremely helpful both to assess for potential impaction in the subcapital femoral neck region as well as to assess for avascular necrosis. Electronically Signed   By: Bretta Bang III M.D.   On: 03/01/2017 14:57     CBC  Recent Labs Lab 03/01/17 1353 03/01/17 2357 03/03/17 0314  WBC 17.0* 12.9* 9.0  HGB 13.9 11.8* 11.5*  HCT 41.5 35.4* 34.0*  PLT 204 173 160  MCV 86.4 85.4 86.9  MCH 28.9 28.5 29.2  MCHC 33.4 33.4 33.6  RDW 15.3* 15.6* 15.5*  LYMPHSABS 0.6*  --   --   MONOABS 0.8  --   --   EOSABS 0.0  --   --   BASOSABS 0.1  --   --     Chemistries   Recent Labs Lab 03/01/17 1353 03/01/17 2357 03/03/17 0314  NA 137 136 138  K 4.6 3.8 3.6  CL 99* 106 107  CO2 21* 22 22  GLUCOSE 147* 107* 113*  BUN 42* 38* 30*  CREATININE 1.87* 1.45* 1.16  CALCIUM 9.4 8.2* 8.1*  AST 89*  --   --   ALT 25  --   --   ALKPHOS 137*  --   --   BILITOT 1.6*  --   --    ------------------------------------------------------------------------------------------------------------------ estimated creatinine clearance is 75.3 mL/min (by C-G formula based on SCr of 1.16 mg/dL). ------------------------------------------------------------------------------------------------------------------ No results for input(s): HGBA1C in the last 72  hours. ------------------------------------------------------------------------------------------------------------------  Recent Labs  03/01/17 1727  CHOL 169  HDL 51  LDLCALC 86  TRIG 162*  CHOLHDL 3.3   ------------------------------------------------------------------------------------------------------------------ No results for input(s): TSH, T4TOTAL, T3FREE, THYROIDAB in the last 72 hours.  Invalid input(s): FREET3 ------------------------------------------------------------------------------------------------------------------ No results for input(s): VITAMINB12, FOLATE, FERRITIN, TIBC, IRON, RETICCTPCT in the last 72 hours.  Coagulation profile  Recent Labs Lab 03/01/17 1353  INR 1.26    No results for input(s): DDIMER in the last 72 hours.  Cardiac Enzymes  Recent Labs Lab 03/02/17 0559 03/02/17 1057 03/02/17 1745  TROPONINI 7.87* 6.61* 4.75*   ------------------------------------------------------------------------------------------------------------------ Invalid input(s): POCBNP    Assessment & Plan  Patient is a 66 year old found on the floor with noted elevated troponin and CPK  *Elevated troponin per cardiology feels this is related to his rhabdomyolysis and fall they do not think this is a non-ST MI Continue aspirin  Echo shows no significant wall motion abnormality Discontinue IV heparin Stop cholesterol lowering medication due to elevated CPK which is very high      * Fall   with rhabdomyolysis continue supportive care follow CPK in the morning  * Pain on the right hip.   X-ray right hip does not give clear idea about fracture, CT scan showed severe arthritic changes outpatient PT evaluation   * Acute renal failure   This is secondary to dehydration and recheck BMP in the morning  * Rhabdomyolysis   CK level is high, recheck tomorrow     Code Status Orders        Start     Ordered   03/01/17 1902  Full code  Continuous      03/01/17 1901    Code Status History    Date Active Date Inactive Code Status Order ID  Comments User Context   06/21/2016  4:02 PM 06/23/2016  6:54 PM Full Code [id number]  Salley Hews, MD ED           Consults  Pt, cards   DVT Prophylaxis heparin  Lab Results  Component Value Date   PLT 160 03/03/2017     Time Spent in minutes   35 minGreater than 50% of time spent in care coordination and counseling patient regarding the condition and plan of care.   Auburn Bilberry M.D on 03/03/2017 at 2:31 PM  Between 7am to 6pm - Pager - [phone]  After 6pm go to [email or web address] - password [email or web address] [facility]  [facility]   Office  [phone]

## 2017-03-03 NOTE — Consult Note (Signed)
ANTICOAGULATION CONSULT NOTE - Initial Consult  Pharmacy Consult for heparin drip Indication: chest pain/ACS  Allergies  Allergen Reactions  . Shellfish Allergy Nausea And Vomiting    Patient Measurements: Height: 5\' 7"  (170.2 cm) Weight: 250 lb (113.4 kg) IBW/kg (Calculated) : 66.1 Heparin Dosing Weight: 91.9kg (based off of pt estimated weight of 250lb-bed in ER does not work)  Vital Signs: Temp: 98.5 F (36.9 C) (07/18 0302) Temp Source: Oral (07/18 0302) BP: 114/60 (07/18 0302) Pulse Rate: 76 (07/18 0302)  Labs:  Recent Labs  03/01/17 1353  03/01/17 2357 03/02/17 0559 03/02/17 0817 03/02/17 1057 03/02/17 1745 03/02/17 1847 03/03/17 0314  HGB 13.9  --  11.8*  --   --   --   --   --  11.5*  HCT 41.5  --  35.4*  --   --   --   --   --  34.0*  PLT 204  --  173  --   --   --   --   --  160  APTT 27  --   --   --   --   --   --   --   --   LABPROT 15.9*  --   --   --   --   --   --   --   --   INR 1.26  --   --   --   --   --   --   --   --   HEPARINUNFRC  --   < > 0.12*  --  0.22*  --   --  0.15* 0.37  CREATININE 1.87*  --  1.45*  --   --   --   --   --  1.16  CKTOTAL 4,083*  --  5,019*  --   --   --   --   --  1,719*  TROPONINI 3.50*  < > 8.27* 7.87*  --  6.61* 4.75*  --   --   < > = values in this interval not displayed.  Estimated Creatinine Clearance: 75.3 mL/min (by C-G formula based on SCr of 1.16 mg/dL).   Medical History: Past Medical History:  Diagnosis Date  . Acquired hypothyroidism    a. s/p thyroidectomy.  . Arthritis   . Depression   . Diabetes mellitus without complication (HCC)   . GERD (gastroesophageal reflux disease)   . Hyperlipidemia   . Hypertension   . Vitamin D deficiency     Medications:  Scheduled:  . aspirin EC  81 mg Oral Daily  . cholecalciferol  2,000 Units Oral Daily  . famotidine  10 mg Oral BID  . levothyroxine  176 mcg Oral QAC breakfast  . PARoxetine  30 mg Oral Daily  . traZODone  50 mg Oral QHS     Assessment: Pt is a 66 year old male who presents s/p fall last night. Pt has been on the floor all night. Pt has an elevated troponin 3.5. Pt denies any anticoagulation use. Pharmacy consulted to dose heparin drip. Baseline APTT and INR added onto labs.  Goal of Therapy:  Heparin level 0.3-0.7 units/ml Monitor platelets by anticoagulation protocol: Yes   Plan:  Give 4000 units bolus x 1 Start heparin infusion at 1100 units/hr Check anti-Xa level in 6 hours and daily while on heparin Continue to monitor H&H and platelets  Follow up on accurate weight when pt gets to the floor  7/17 @ 0000 HL 0.12  subtherapeutic. Will rebolus w/ heparin 3500 units IV x 1 and increase rate to 1300 units/hr and will recheck Hl @ 0800. CBC trending down.  7/17 0817 HL subtherapeutic. 1400 units IV x 1 bolus and increase rate to 1500 units/hr. Will recheck HL in 6 hours.  7/17 1900 HL subtherapeutic at 0.15. Per RN drip was not turned off. Bolus 2750 units and increase rate to 1800 units/hr. Recheck level in 6 hours.  7/17 @ 0300 HL 0.37 therapeutic. Will continue current rate and will recheck HL @ 1100. CBC slightly trending down.  Thomasene Rippleavid Kery Batzel, PharmD, BCPS Clinical Pharmacist 03/03/2017

## 2017-03-03 NOTE — Care Management (Addendum)
CSW relayed that has made contact with [facility] and [facility].  Physical therapy has recommended SNF and patient is in agreement. He  had concerns that he would lose his apartment but  [facility] has been informed he will not lose his apartment if he goes to skilled nursing

## 2017-03-03 NOTE — Clinical Social Work Note (Addendum)
CSW received consult that patient may need SNF placement, CSW awaiting PT and OT recommendations.  CSW to continue to follow patient's progress throughout discharge planning.  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  Ervin KnackEric R. Khayree Delellis, MSW, Theresia MajorsLCSWA [phone]  03/03/2017 11:15 AM

## 2017-03-03 NOTE — Clinical Social Work Note (Signed)
Clinical Social Work Assessment  Patient Details  Name: Clayton Grebernest J Stancil Jr. MRN: [id number] Date of Birth: 10/25/1950  Date of referral:  03/03/17               Reason for consult:  Facility Placement                Permission sought to share information with:  Facility Medical sales representativeContact Representative Permission granted to share information::  Yes, Verbal Permission Granted  Name::     Lamar LaundryFaulk,James R Friend [phone] or Pulliam,Quentin Other   [phone] or Francia GreavesRister,Kenneth    [phone]   Agency::  SNF admissions  Relationship::     Contact Information:     Housing/Transportation Living arrangements for the past 2 months:  Apartment Source of Information:  Patient, Other (Comment Required) Human resources officer(RHA worker) Patient Interpreter Needed:  None Criminal Activity/Legal Involvement Pertinent to Current Situation/Hospitalization:  No - Comment as needed Significant Relationships:  Other(Comment) Human resources officer(RHA worker and family friend.) Lives with:  Self Do you feel safe going back to the place where you live?  No Need for family participation in patient care:  No (Coment)  Care giving concerns:  Patient feels he needs some short term rehab before he is able to return back home.  Social Worker assessment / plan:  Patient is a 66 year old male who is alert and oriented x4.  Patient lives alone in his own apartment, patient stated he fell and was laying on the floor for about 16 hours until his RHA worker found him at home.  Patient states he has been living in his own apartment for about a year.  Patient states he has been to rehab before, CSW reminded him of what to expect and how insurance [facility]  Patient states [facility] the past and would like to return if possible.  Patient expressed that he knows the people there and was pleased with the care that was provided.  Patient did not have any other questions or concerns and gave CSW permission to begin bed search [place].  Employment status:  Retired, Disabled (Comment on whether or not currently receiving Disability) Insurance information:  [place], [place]:  Skilled Nursing Facility Information / Referral to community resources:  Skilled Nursing Facility  Patient/Family's Response to care:  Patient is in agreement to going to SNF for short term rehab.  Patient/Family's Understanding of and Emotional Response to Diagnosis, Current Treatment, and Prognosis:  Patient stated he hopes he does not have to stay at snf for                             very long for rehab    Emotional Assessment Appearance:  Appears stated age Attitude/Demeanor/Rapport:    Affect (typically observed):  Stable, Hopeful, Appropriate Orientation:  Oriented to Self, Oriented to Place, Oriented to  Time, Oriented to Situation Alcohol / Substance use:  Not Applicable Psych involvement (Current and /or in the community):  No (Comment)  Discharge Needs  Concerns to be addressed:  Lack of Support Readmission within the last 30 days:  No Current discharge risk:  Lack of support system, Lives alone Barriers to Discharge:  Continued Medical Work up   [place] [employer], Kaoru Rezendes R, LCSWA 03/03/2017, 4:49 PM

## 2017-03-03 NOTE — Evaluation (Signed)
Occupational Therapy Evaluation Patient Details Name: Clayton Grebernest J Quilling Jr. MRN: [id number] DOB: 09/29/1950 Today's Date: 03/03/2017    History of Present Illness 66yo male pt found on the floor s/p fall with rhabdomyolysis and admitted 7/17 for NSTEMI. R hip x-ray negative for fracture, acute renal failure secondary to deydration. PMHx significant for acquired hypothyroidism, arthritis, depression, DM, GERD, HTN, HLD, vitamin D deficiency.   Clinical Impression   Pt seen for OT evaluation this date. Pt was modified indep with stand pivot transfers and basic self care tasks from w/c level at baseline, requiring assistance for transportation from [employer] per pt report. Pt presents with poor dentition, BLE pain, decreased strength/balance/activity tolerance, and increased falls risk. Pt will benefit from skilled OT services to address noted impairments and functional deficits in mobility and self care tasks in order to maximize safe return to PLOF.     Follow Up Recommendations  Home health OT    Equipment Recommendations  3 in 1 bedside commode (bariatric 3:1)    Recommendations for Other Services       Precautions / Restrictions Precautions Precautions: Fall Restrictions Weight Bearing Restrictions: No      Mobility Bed Mobility Overal bed mobility: Needs Assistance Bed Mobility: Supine to Sit;Sit to Supine     Supine to sit: Supervision Sit to supine: Supervision   General bed mobility comments: increased time, effort, use of bed rails, ultimately no physical assist required  Transfers                      Balance Overall balance assessment: Needs assistance;History of Falls Sitting-balance support: Bilateral upper extremity supported;Feet unsupported Sitting balance-Leahy Scale: Fair Sitting balance - Comments: R lateral lean in bed, pt states this has been his baseline for a long time, unable to explain why Postural control: Right lateral lean                                  ADL either performed or assessed with clinical judgement   ADL Overall ADL's : Needs assistance/impaired Eating/Feeding: Set up;Bed level   Grooming: Bed level;Set up   Upper Body Bathing: Set up;Supervision/ safety;Sitting   Lower Body Bathing: Set up;Bed level;Minimal assistance   Upper Body Dressing : Set up;Sitting;Supervision/safety   Lower Body Dressing: Minimal assistance;Bed level                 General ADL Comments: pt generally min assist for LB ADL tasks at bed level     Vision Baseline Vision/History: Wears glasses Wears Glasses: Reading only Patient Visual Report: No change from baseline Vision Assessment?: No apparent visual deficits     Perception     Praxis      Pertinent Vitals/Pain Pain Assessment: 0-10 Pain Score: 8  Pain Location: BLE Pain Descriptors / Indicators: Aching Pain Intervention(s): Limited activity within patient's tolerance;Monitored during session     Hand Dominance Left   Extremity/Trunk Assessment Upper Extremity Assessment Upper Extremity Assessment: Overall WFL for tasks assessed   Lower Extremity Assessment Lower Extremity Assessment: Generalized weakness;Defer to PT evaluation   Cervical / Trunk Assessment Cervical / Trunk Assessment: Normal   Communication Communication Communication: No difficulties   Cognition Arousal/Alertness: Awake/alert Behavior During Therapy: Flat affect Overall Cognitive Status: No family/caregiver present to determine baseline cognitive functioning  General Comments: pt alert and oriented, follows simple commands, able to answer basic safety questions; very talkative giving extraneous details when asked direct questions at times, requires verbal cues to redirect   General Comments  scabs noted on BLE, L>R    Exercises     Shoulder Instructions      Home Living Family/patient expects to be  discharged to:: Private residence Living Arrangements: Alone Available Help at Discharge: Other (Comment) ([employer] ) Type of Home: Apartment (handicap accessible) Home Access: Level entry     Home Layout: One level     Bathroom Shower/Tub:  (does not use shower, takes sponge back in w/c)   Bathroom Toilet: Standard Bathroom Accessibility: Yes How Accessible: Accessible via walker;Accessible via wheelchair Home Equipment: Dan Humphreys - 2 wheels;Wheelchair - manual;Adaptive equipment;Grab bars - toilet;Hospital bed Adaptive Equipment: Reacher;Sock aid Additional Comments: [employer] come 2x/wk.  They take pt to store, run errands.        Prior Functioning/Environment Level of Independence: Needs assistance  Gait / Transfers Assistance Needed: indep from w/c level and performs SPT transfers to toilet and hospital bed.  Does not use walker to perform SPT.  Pt reports it has been several years since he ambulated. ADL's / Homemaking Assistance Needed: indep from w/c level with meal prep, med mgt, and household mgt tasks; Horticulturist, commercial assists with transportation for dr appts, groceries, bank, etc.; Pt reports that [employer] his bills   Comments: Pt reports that a family friend is his HCPOA; recent fall leading to this admission, unable to get up after attempting transfer out of w/c while dizzy        OT Problem List: Pain;Decreased strength;Decreased safety awareness;Decreased activity tolerance;Decreased knowledge of use of DME or AE;Impaired balance (sitting and/or standing)      OT Treatment/Interventions: Self-care/ADL training;Therapeutic exercise;Therapeutic activities;Energy conservation;DME and/or AE instruction;Patient/family education    OT Goals(Current goals can be found in the care plan section) Acute Rehab OT Goals Patient Stated Goal: get stronger so I can go home OT Goal Formulation: With patient Time For Goal  Achievement: 03/17/17 Potential to Achieve Goals: Good  OT Frequency: Min 1X/week   Barriers to D/C: Decreased caregiver support          Co-evaluation              AM-PAC PT "6 Clicks" Daily Activity     Outcome Measure Help from another person eating meals?: None Help from another person taking care of personal grooming?: None Help from another person toileting, which includes using toliet, bedpan, or urinal?: A Lot Help from another person bathing (including washing, rinsing, drying)?: A Little Help from another person to put on and taking off regular upper body clothing?: None Help from another person to put on and taking off regular lower body clothing?: A Little 6 Click Score: 20   End of Session    Activity Tolerance: Patient tolerated treatment well Patient left: in bed;with call bell/phone within reach;with bed alarm set  OT Visit Diagnosis: Other abnormalities of gait and mobility (R26.89);History of falling (Z91.81);Muscle weakness (generalized) (M62.81);Pain Pain - part of body: Leg (bilaterally)                Time: 4098-1191 OT Time Calculation (min): 49 min Charges:  OT General Charges $OT Visit: 1 Procedure OT Evaluation $OT Eval Low Complexity: 1 Procedure OT Treatments $Self Care/Home Management : 8-22 mins G-Codes:     Clayton Green, MPH, MS, OTR/L ascom  [phone] 03/03/17, 11:50 AM

## 2017-03-04 LAB — CBC
HCT: 35.9 % — ABNORMAL LOW (ref 40.0–52.0)
Hemoglobin: 12 g/dL — ABNORMAL LOW (ref 13.0–18.0)
MCH: 29.7 pg (ref 26.0–34.0)
MCHC: 33.5 g/dL (ref 32.0–36.0)
MCV: 88.6 fL (ref 80.0–100.0)
PLATELETS: 153 10*3/uL (ref 150–440)
RBC: 4.05 MIL/uL — ABNORMAL LOW (ref 4.40–5.90)
RDW: 15.6 % — AB (ref 11.5–14.5)
WBC: 8.2 10*3/uL (ref 3.8–10.6)

## 2017-03-04 LAB — CK: Total CK: 758 U/L — ABNORMAL HIGH (ref 49–397)

## 2017-03-04 LAB — BASIC METABOLIC PANEL
ANION GAP: 6 (ref 5–15)
BUN: 24 mg/dL — ABNORMAL HIGH (ref 6–20)
CALCIUM: 8.5 mg/dL — AB (ref 8.9–10.3)
CO2: 24 mmol/L (ref 22–32)
CREATININE: 1.06 mg/dL (ref 0.61–1.24)
Chloride: 108 mmol/L (ref 101–111)
GLUCOSE: 111 mg/dL — AB (ref 65–99)
Potassium: 4.1 mmol/L (ref 3.5–5.1)
Sodium: 138 mmol/L (ref 135–145)

## 2017-03-04 MED ORDER — OXYCODONE-ACETAMINOPHEN 5-325 MG PO TABS: 1.0000 | ORAL_TABLET | ORAL | 0 refills | Status: DC | PRN

## 2017-03-04 MED ORDER — DOCUSATE SODIUM 100 MG PO CAPS: 100.0000 mg | ORAL_CAPSULE | Freq: Two times a day (BID) | ORAL | 0 refills | Status: DC | PRN

## 2017-03-04 MED ORDER — AMOXICILLIN-POT CLAVULANATE 875-125 MG PO TABS
1.0000 | ORAL_TABLET | Freq: Two times a day (BID) | ORAL | Status: DC
  Administered 2017-03-04: 1 via ORAL
  Filled 2017-03-04: qty 1

## 2017-03-04 MED ORDER — AMOXICILLIN-POT CLAVULANATE 875-125 MG PO TABS: 1.0000 | ORAL_TABLET | Freq: Two times a day (BID) | ORAL | 0 refills | Status: AC

## 2017-03-04 NOTE — Clinical Social Work Note (Signed)
CSW presented bed offers, and patient [facility].  [facility] and [facility] can accept patient once [facility] number has been received.  CSW continuing to follow patient's progress throughout discharge planning.  Ervin KnackEric R. Sidhant Helderman, MSW, Theresia MajorsLCSWA [phone]  03/04/2017 12:13 PM

## 2017-03-04 NOTE — Clinical Social Work Placement (Addendum)
   CLINICAL SOCIAL WORK PLACEMENT  NOTE  Date:  03/04/2017  Patient Details  Name: Clayton Grebernest J Keinath Jr. MRN: [id number] Date of Birth: 09/03/1950  Clinical Social Work is seeking post-discharge placement for this patient at the Skilled  Nursing Facility level of care (*CSW will initial, date and re-position this form in  chart as items are completed):  Yes   Patient/family provided with [facility] Clinical Social Work Department's list of facilities offering this level of care within the geographic area requested by the patient (or if unable, by the patient's family).  Yes   Patient/family informed of their freedom to choose among providers that offer the needed level of care, that participate in Medicare, Medicaid or managed care program needed by the patient, have an available bed and are willing to accept the patient.  Yes   Patient/family informed of [facility]'s ownership interest in [facility] and [facility], as well as of the fact that they are under no obligation to receive care at these facilities.  PASRR submitted to EDS on 03/04/17     PASRR number received on       Existing PASRR number confirmed on       FL2 transmitted to all facilities in geographic area requested by pt/family on 03/03/17     FL2 transmitted to all facilities within larger geographic area on       Patient informed that his/her managed care company has contracts with or will negotiate with certain facilities, including the following:        Yes   Patient/family informed of bed offers received.  [facility]     Physician recommends and patient chooses bed at      [facility] [place] [facility]  .  Patient to be transferred to facility [facility]     Patient family notified on  03-04-17 of transfer. (Updated Windell MouldingEric Nikisha Fleece, MSW, [place]CSWA, 03-04-17)  Name of family member notified:   Francia GreavesKenneth Green RHA worker  [phone] (Updated Windell MouldingEric Oziah Vitanza, MSW, [place]CSWA, 03-04-17)     PHYSICIAN Please sign FL2     Additional Comment:    _______________________________________________ Darleene CleaverAnterhaus, Deeann Servidio R, LCSWA 03/04/2017, 12:14 PM  (Updated Windell MouldingEric Omkar Stratmann, MSW, [place]CSWA, 03-04-17)

## 2017-03-04 NOTE — Discharge Summary (Signed)
[facility] 66 y.o., DOB 06/16/1951, MRN [id number]. 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 male with a known history of Hypothyroidism, depression, diabetes, hyperlipidemia, hypertension, vitamin D deficiency- lives in an apartment building, wanted to walk to the bathroom in the night and lost his balance and fell down on the floor since then he could not get up and he said that he stayed all night and then part of the day on the floor he did not had phone with him so could not call anybody finally he is a good friend came to check on him and found him on the floor and called EMS. Patient in the emergency room was noted to have significant elevated CPK as well as elevated troponin. He was admitted for possible non-ST MI. He was started on a heparin drip. His troponin was trended. He had a echocardiogram of the heart which showed normal ejection fraction. Cardiology did not feel that he had an acute MI. They felt that his elevated troponin was combination of rhabdomyolysis and demand ischemia. Patient is very weak and deconditioned and in need of rehabilitation.  Antibiotics with Augmentin for 5  days               Consults  cardiology  Significant Tests:  See full reports for all details     Ct Hip Right Wo Contrast  Result Date: 03/01/2017 CLINICAL DATA:  Patient was found down. Right lower extremity deformity. EXAM: CT OF THE RIGHT HIP WITHOUT CONTRAST TECHNIQUE: Multidetector CT imaging of the right hip was performed according to the standard protocol. Multiplanar CT image reconstructions were also generated. COMPARISON:  Radiographs dated 03/01/2017 FINDINGS: Bones/Joint/Cartilage There is no fracture or dislocation. 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 There is diffuse marked narrowing of the joint space with numerous erosions of the femoral head and acetabulum. However, there is no acute abnormality. No appreciable soft tissue hematoma. IMPRESSION: 1. No acute fracture or dislocation. 2. 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 Electronically Signed   By: Francene Boyers M.D.   On: 03/01/2017 17:13   Dg Chest Port 1 View  Result Date: 03/01/2017 CLINICAL DATA:  Pain following fall.  Hypertension. EXAM: PORTABLE CHEST 1 VIEW COMPARISON:  June 21, 2016 FINDINGS: There is mild right base atelectasis. Lungs elsewhere are clear. The heart size and pulmonary vascularity are normal. Aorta is mildly prominent but stable. No adenopathy. Bones are osteoporotic. IMPRESSION: Mild right base atelectasis. No edema or consolidation. Stable cardiac silhouette. Aortic prominence likely is indicative of chronic hypertension. Electronically Signed   By: Bretta Bang III M.D.   On: 03/01/2017 14:58   Dg Hip Unilat With Pelvis 2-3 Views Right  Result Date: 03/01/2017 CLINICAL DATA:  Fall with right lower extremity foreshortened and  externally rotated EXAM: DG HIP (WITH OR WITHOUT PELVIS) 2-3V RIGHT COMPARISON:  None. FINDINGS: Frontal pelvis as well as frontal and lateral right hip images obtained. A well-defined  fracture is not appreciable on this examination. There is suggestion of a subcapital femoral neck fracture on the frontal view. 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 No demonstrable dislocation. There is mild narrowing of the left hip joint. There is underlying osteoporosis. IMPRESSION: A well-defined fracture is not appreciable by radiography. There is a suggestion of potential impaction in the subcapital femoral region. There is no other evidence suggesting potential fracture. No dislocation. 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 Bones osteoporotic.  Mild narrowing left hip joint. Given this circumstance, MR of the pelvis and right hip could be extremely helpful both to assess for potential impaction in the subcapital femoral neck region as well as to assess for avascular necrosis. Electronically Signed   By: Bretta BangWilliam  Woodruff III M.D.   On: 03/01/2017 14:57       Today   Subjective:   Clayton Green  patient feeling better and is very weak denies any chest pain or shortness of breath    Objective:   Blood pressure (!) 151/77, pulse 84, temperature 98.7 F (37.1 C), temperature source Oral, resp. rate 20, height 5\' 7"  (1.702 m), weight 250 lb (113.4 kg), SpO2 93 %.  .  Intake/Output Summary (Last 24 hours) at 03/04/17 1031 Last data filed at 03/04/17 0912  Gross per 24 hour  Intake             2140 ml  Output              450 ml  Net             1690 ml    Exam VITAL SIGNS: Blood pressure (!) 151/77, pulse 84, temperature 98.7 F (37.1 C), temperature source Oral, resp. rate 20, height 5\' 7"  (1.702 m), weight 250 lb (113.4 kg), SpO2 93 %.  GENERAL:  66 y.o.-year-old patient lying in the bed with no acute distress.  EYES: Pupils equal, round,  reactive to light and accommodation. No scleral icterus. Extraocular muscles intact.  HEENT: Head atraumatic, normocephalic. Oropharynx and nasopharynx clear.  NECK:  Supple, no jugular venous distention. No thyroid enlargement, no tenderness.  LUNGS: Normal breath sounds bilaterally, no wheezing, rales,rhonchi or crepitation. No use of accessory muscles of respiration.  CARDIOVASCULAR: S1, S2 normal. No murmurs, rubs, or gallops.  ABDOMEN: Soft, nontender, nondistended. Bowel sounds present. No organomegaly or mass.  EXTREMITIES: Left lower extremity with erythema and a drainage.  NEUROLOGIC: Cranial nerves II through XII are intact. Muscle strength 5/5 in all extremities. Sensation intact. Gait not checked.  PSYCHIATRIC: The patient is alert and oriented x 3.  SKIN: No obvious rash, lesion, or ulcer.   Data Review     CBC w Diff: Lab Results  Component Value Date   WBC 8.2 03/04/2017   HGB 12.0 (L) 03/04/2017   HCT 35.9 (L) 03/04/2017   PLT 153 03/04/2017   LYMPHOPCT 4 03/01/2017   MONOPCT 5 03/01/2017   EOSPCT 0 03/01/2017   BASOPCT 0 03/01/2017   CMP: Lab Results  Component Value Date   NA 138 03/04/2017   K 4.1 03/04/2017   CL 108  03/04/2017   CO2 24 03/04/2017   BUN 24 (H) 03/04/2017   BUN 14 11/04/2011   CREATININE 1.06 03/04/2017   CREATININE 1.07 09/08/2016   PROT 8.3 (H) 03/01/2017   ALBUMIN 4.1 03/01/2017   BILITOT 1.6 (H) 03/01/2017   ALKPHOS 137 (H) 03/01/2017   AST 89 (H) 03/01/2017   ALT 25 03/01/2017  .  Micro Results No results found for this or any previous visit (from the past 240 hour(s)).      Code Status Orders        Start     Ordered   03/01/17 1902  Full code  Continuous     03/01/17 1901    Code Status History    Date Active Date Inactive Code Status Order ID Comments User Context   06/21/2016  4:02 PM 06/23/2016  6:54 PM Full Code [id number]  Salley Hews, MD ED            Discharge Medications   Allergies as of  03/04/2017      Reactions   Shellfish Allergy Nausea And Vomiting      Medication List    STOP taking these medications   furosemide 20 MG tablet Commonly known as:  LASIX     TAKE these medications   acetaminophen 500 MG tablet Commonly known as:  TYLENOL Take 500 mg by mouth 2 (two) times daily as needed.   amoxicillin-clavulanate 875-125 MG tablet Commonly known as:  AUGMENTIN Take 1 tablet by mouth every 12 (twelve) hours.   aspirin 81 MG tablet Take 1 tablet (81 mg total) by mouth daily.   atorvastatin 20 MG tablet Commonly known as:  LIPITOR Take 1 tablet (20 mg total) by mouth daily.   Cholecalciferol 2000 units Caps Take 1 capsule (2,000 Units total) by mouth daily.   docusate sodium 100 MG capsule Commonly known as:  COLACE Take 1 capsule (100 mg total) by mouth 2 (two) times daily as needed for mild constipation.   levothyroxine 88 MCG tablet Commonly known as:  SYNTHROID, LEVOTHROID Take 2 tablets (176 mcg total) by mouth daily before breakfast.   lisinopril 10 MG tablet Commonly known as:  PRINIVIL,ZESTRIL Take 1 tablet (10 mg total) by mouth daily.   metFORMIN 500 MG tablet Commonly known as:  GLUCOPHAGE Take 1 tablet (500 mg total) by mouth 2 (two) times daily with a meal.   mineral oil-hydrophilic petrolatum ointment Apply topically as needed for dry skin. Apply to legs daily.   oxyCODONE-acetaminophen 5-325 MG tablet Commonly known as:  PERCOCET/ROXICET Take 1-2 tablets by mouth every 4 (four) hours as needed for moderate pain.   PARoxetine 30 MG tablet Commonly known as:  PAXIL Take 1 tablet (30 mg total) by mouth daily.   ranitidine 150 MG tablet Commonly known as:  ZANTAC Take 1 tablet (150 mg total) by mouth at bedtime.   traZODone 50 MG tablet Commonly known as:  DESYREL Take 50 mg by mouth at bedtime.          Total Time in preparing paper work, data evaluation and todays exam - 35 minutes  Auburn Bilberry M.D on 03/04/2017  at 10:31 AM  [facility]   Office  [phone]

## 2017-03-04 NOTE — Progress Notes (Signed)
Discharge [facility] iv and tele removed/ EMS called to transport

## 2017-03-04 NOTE — Discharge Instructions (Signed)
[facility] ° °DIET:  °Cardiac diet ° °DISCHARGE CONDITION:  °Stable ° °ACTIVITY:  °Activity as tolerated ° °OXYGEN:  °Home Oxygen: No. °  °Oxygen Delivery: room air ° °DISCHARGE LOCATION:  °home  ° ° °ADDITIONAL DISCHARGE INSTRUCTION: ° ° °If you experience worsening of your admission symptoms, develop shortness of breath, life threatening emergency, suicidal or homicidal thoughts you must seek medical attention immediately by calling 911 or calling your MD immediately  if symptoms less severe. ° °You Must read complete instructions/literature along with all the possible adverse reactions/side effects for all the Medicines you take and that have been prescribed to you. Take any new Medicines after you have completely understood and accpet all the possible adverse reactions/side effects.  ° °Please note ° °You were cared for by a hospitalist during your hospital stay. If you have any questions about your discharge medications or the care you received while you were in the hospital after you are discharged, you can call the unit and asked to speak with the hospitalist on call if the hospitalist that took care of you is not available. Once you are discharged, your primary care physician will handle any further medical issues. Please note that NO REFILLS for any discharge medications will be authorized once you are discharged, as it is imperative that you return to your primary care physician (or establish a relationship with a primary care physician if you do not have one) for your aftercare needs so that they can reassess your need for medications and monitor your lab values. ° ° °

## 2017-03-04 NOTE — Care Management Important Message (Signed)
Important Message  Patient Details  Name: Clayton Grebernest J Christy Jr. MRN: [id number] Date of Birth: 04/10/1951   Medicare Important Message Given:  Yes Signed IM notice given    Eber HongGreene, Mikaella Escalona R, RN 03/04/2017, 3:46 PM

## 2017-03-04 NOTE — Progress Notes (Signed)
Physical Therapy Treatment Patient Details Name: Clayton Grebernest J Howatt Jr. MRN: [id number] DOB: 09/14/1950 Today's Date: 03/04/2017    History of Present Illness Pt is a 66 y/o M who presented after falling on floor where he stayed all night and part of the next day as he did not have a phone and could not stand up. Pt denies losing consciousness. Pt's PMH includes partial thyroidectomy.    PT Comments    Pt in recliner upon arrival.  Stated he required +2 assist with walker this am with nsg staff but felt that he did better overall with his mobility.  He declined further standing and transfers at this time but agreed to exercises.  He tolerated exercises well but needed verbal cues to slow motion and to complete full range.  Bilateral knee ROM limited which he attributes to soreness from fall prior to admit.     Follow Up Recommendations  SNF     Equipment Recommendations  None recommended by PT    Recommendations for Other Services       Precautions / Restrictions Precautions Precautions: Fall Restrictions Weight Bearing Restrictions: No    Mobility  Bed Mobility               General bed mobility comments: up in recliner upon arrival  Transfers Overall transfer level: Needs assistance               General transfer comment: declined further trials at this time  Ambulation/Gait                 Stairs            Wheelchair Mobility    Modified Rankin (Stroke Patients Only)       Balance                                            Cognition Arousal/Alertness: Awake/alert Behavior During Therapy: WFL for tasks assessed/performed Overall Cognitive Status: Within Functional Limits for tasks assessed                                        Exercises Other Exercises Other Exercises: Pt particiapted in seated AROM for ankle pumps, LAQ, marches and ab/adduction.  Pt rotated through exercises x 4 with several  minutes of AROM for each set remaining in motion for about 10 minutes before fatigue.    General Comments        Pertinent Vitals/Pain Pain Assessment: 0-10 Pain Score: 4  Pain Location: BLE Pain Descriptors / Indicators: Aching Pain Intervention(s): Limited activity within patient's tolerance    Home Living                      Prior Function            PT Goals (current goals can now be found in the care plan section)      Frequency    Min 2X/week      PT Plan      Co-evaluation              AM-PAC PT "6 Clicks" Daily Activity  Outcome Measure  Difficulty turning over in bed (including adjusting bedclothes, sheets and blankets)?: Total Difficulty moving from lying on back to sitting on  the side of the bed? : Total Difficulty sitting down on and standing up from a chair with arms (e.g., wheelchair, bedside commode, etc,.)?: Total Help needed moving to and from a bed to chair (including a wheelchair)?: A Lot Help needed walking in hospital room?: Total Help needed climbing 3-5 steps with a railing? : Total 6 Click Score: 7    End of Session Equipment Utilized During Treatment: Gait belt Activity Tolerance: Patient tolerated treatment well;Patient limited by fatigue Patient left: in chair;with call bell/phone within reach;with chair alarm set         Time:  -     Charges:                       G Codes:       Danielle Dess, PTA 03/04/17, 11:21 AM

## 2017-03-04 NOTE — Clinical Social Work Note (Signed)
CSW received [facility] number on patient.  [facility] number is [phone].  [facility].  [facility], patient and family agreeable to plans will transport via ems RN to call report to room 318P C wing at [phone]  Windell MouldingEric Evamae Rowen, MSW, [employer]WA [phone]

## 2017-03-17 ENCOUNTER — Ambulatory Visit: Payer: Medicare Other | Admitting: Family Medicine

## 2017-03-26 ENCOUNTER — Inpatient Hospital Stay: Payer: Medicare Other | Admitting: Family Medicine

## 2017-03-29 ENCOUNTER — Telehealth: Payer: Self-pay | Admitting: Family Medicine

## 2017-03-29 NOTE — Telephone Encounter (Signed)
Eber Jonesarolyn, physical therapist with [facility] needs a verbal order for pt to add nursing for wound on pt's leg and also PT twice a week for 6 weeks.  Her call back number is [phone]

## 2017-03-29 NOTE — Telephone Encounter (Signed)
Attempted to call back, unable to reach, no voicemail available. Will try again later. I would like to provide the verbal order for patient to add nursing for wound on leg and PT twice week for 6 weeks. If she calls back possibly can provide this verbal from me.  Saralyn PilarAlexander Mindy Behnken, DO [facility] 03/29/2017, 9:34 AM

## 2017-03-29 NOTE — Telephone Encounter (Signed)
Attempted to call back no VM set will call back later.

## 2017-03-30 NOTE — Telephone Encounter (Signed)
Spoke with Cheryl FlashVanessa Wilson through [employer] community message today, she noticed this update in chart, and she has already reached out to PT for authorizing my verbal order, this issue should already be solved.  In future, she said we can send her a community message to connect with PT or other nursing needs if we cannot reach them back by phone.  Saralyn PilarAlexander Annabelle Rexroad, DO [facility] 03/30/2017, 1:01 PM

## 2017-03-31 ENCOUNTER — Telehealth: Payer: Self-pay | Admitting: Family Medicine

## 2017-03-31 NOTE — Telephone Encounter (Signed)
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 Faxed to [facility] today 8/15.  Saralyn PilarAlexander Franklin Baumbach, DO [facility] 03/31/2017, 4:57 PM

## 2017-03-31 NOTE — Telephone Encounter (Signed)
Verbal given 

## 2017-03-31 NOTE — Telephone Encounter (Signed)
Britta MccreedyBarbara with [facility] is with pt today for nursing eval.  He has a wound on front of right lower leg that is weeping fluid.  She would like an order for a boot wrap and for a social work eval to help with house keeping and personal care.  Please call [phone]

## 2017-03-31 NOTE — Telephone Encounter (Addendum)
[facility] requesting an order for a wheelchair, cushion seat. Please  Fax  Order to  [phone]

## 2017-04-01 ENCOUNTER — Encounter: Payer: Self-pay | Admitting: Family Medicine

## 2017-04-01 DIAGNOSIS — I872 Venous insufficiency (chronic) (peripheral): Secondary | ICD-10-CM

## 2017-04-01 DIAGNOSIS — L03115 Cellulitis of right lower limb: Secondary | ICD-10-CM

## 2017-04-01 MED ORDER — CEPHALEXIN 500 MG PO CAPS: 500.0000 mg | ORAL_CAPSULE | Freq: Three times a day (TID) | ORAL | 0 refills | Status: DC

## 2017-04-01 NOTE — Progress Notes (Addendum)
Received [employer] inbox message in [employer] today 8/16 from Cheryl FlashVanessa Wilson with [facility]. Copied below is her message and copy of my response.  "I just received the photos of the patient's wounds from my RN case manager. I have noticed that the patient has a lot of redness and edema in the periwound area. The surrounding area is shiny in appearance. She also reports that patient's wounds are weeping large amounts of serous fluid. I'm suspecting cellulitis in the right lower extremity. Would you like to treat the patient with a PO antibiotic empirically?  The left lower extremity does not have the same appearance" -------------------------------------------------------------- My response on 8/16  Thanks for the update and the assessment. It sounds like with his peripheral edema and likely venous stasis it could be secondary infection and may be challenging to treat due to swelling as well.   We can certainly provide empiric antibiotic coverage with Keflex antibiotic 500mg  TID for 7 days for potential cellulitis, and also I would encourage increasing Lasix dose from 20mg  every other day to daily for 3-5 days, and work on improving elevation and compression.   Do you think he needs any specialized wound care management?   If leg cellulitis is not improving we can re-evaluate him in office early next week, or he can seek more immediate attention over weekend if needed.   I can go ahead and send in antibiotic to his [facility].   I will be out of office this afternoon (thursday admin time), but can still respond to Epic inbox usually by end of day, so not as prompt if you need me, would prefer contact office if urgent.  Additional UPDATE - [facility] can provide additional compression care with Unna boot wraps and compression in future.  Saralyn PilarAlexander Karamalegos, DO [facility] 04/01/2017, 12:10 PM

## 2017-04-08 ENCOUNTER — Ambulatory Visit (INDEPENDENT_AMBULATORY_CARE_PROVIDER_SITE_OTHER): Payer: Medicare Other | Admitting: Family Medicine

## 2017-04-08 ENCOUNTER — Encounter: Payer: Self-pay | Admitting: Family Medicine

## 2017-04-08 VITALS — BP 136/80 | HR 76 | Temp 98.2°F | Ht 67.0 in | Wt 261.0 lb

## 2017-04-08 DIAGNOSIS — I83219 Varicose veins of right lower extremity with both ulcer of unspecified site and inflammation: Secondary | ICD-10-CM

## 2017-04-08 DIAGNOSIS — M47896 Other spondylosis, lumbar region: Secondary | ICD-10-CM

## 2017-04-08 DIAGNOSIS — R5381 Other malaise: Secondary | ICD-10-CM | POA: Diagnosis not present

## 2017-04-08 DIAGNOSIS — F329 Major depressive disorder, single episode, unspecified: Secondary | ICD-10-CM

## 2017-04-08 DIAGNOSIS — F32A Depression, unspecified: Secondary | ICD-10-CM

## 2017-04-08 DIAGNOSIS — L97919 Non-pressure chronic ulcer of unspecified part of right lower leg with unspecified severity: Secondary | ICD-10-CM

## 2017-04-08 DIAGNOSIS — R531 Weakness: Secondary | ICD-10-CM | POA: Diagnosis not present

## 2017-04-08 DIAGNOSIS — I10 Essential (primary) hypertension: Secondary | ICD-10-CM

## 2017-04-08 DIAGNOSIS — M545 Low back pain, unspecified: Secondary | ICD-10-CM

## 2017-04-08 DIAGNOSIS — Z9181 History of falling: Secondary | ICD-10-CM | POA: Diagnosis not present

## 2017-04-08 DIAGNOSIS — E1142 Type 2 diabetes mellitus with diabetic polyneuropathy: Secondary | ICD-10-CM

## 2017-04-08 DIAGNOSIS — I872 Venous insufficiency (chronic) (peripheral): Secondary | ICD-10-CM

## 2017-04-08 DIAGNOSIS — G8929 Other chronic pain: Secondary | ICD-10-CM

## 2017-04-08 MED ORDER — FUROSEMIDE 20 MG PO TABS: 20.0000 mg | ORAL_TABLET | Freq: Every day | ORAL | 5 refills | Status: DC

## 2017-04-08 NOTE — Progress Notes (Addendum)
Subjective:    Patient ID: Clayton Grebe., male    DOB: 06/15/1951, 66 y.o.   MRN: [id number]  Clayton Ishee. is a 66 y.o. male presenting on 04/08/2017 for Leg Swelling (pt would like to get a FL2 form filled out to get in a rehab on faculty.)  Patient provides history. Also he is accompanied by Fritzi Mandes from [facility], he is a transitional care team leader.  HPI   Patient had been followed by [facility] therapy for PT / RN / wound care for few weeks with limited success, still had difficulty with participating in PT limited progress and also had worsening LE edema and RLE ulceration, concern for infection, unable to make it to doctors visits. He had concern with poor strength and feared even getting out of wheelchair, he has had continence problems due to not making it to bathroom, determined that he needs higher acuity care, now requesting FL2 for prompt SNF admission.  Additionally reported that Adult Protective Services were notified. I have reviewed the documentation from MSW agent evaluation and follow-up eval regarding patient's current living situation and support system, agree with plan, documents scanned into chart. Will need Personal Care Services following further SNF management.  FORM COMPLETION - FL2 (SNF)  Indication: New FL2, admit to SNF rehab Anticipated admission date - 04/08/17 vs 04/09/17 Date of completed form: 04/08/17 Facility - [facility]  Admitting Diagnosis / Medical Diagnosis List 1. Infected stasis ulcer of R lower extremity -  2. Recurrent Falls - R hip pain 3. Generalized Weakness -  4. Physical deconditioning 5. Chronic venous insufficiency 6. TD2M with neuropathy 7. OA/DJD Lumbar, Chronic Back Pain 8. Major Depression  Patient Information: - Oriented  - Semi to Non-ambulatory - mostly wheelchair bound, goals to resume walking - Incontinent (Bladder) and mostly Continent (Bowel) - Inappropriate Behavior (None) - Functional  Limitations - Communication of needs: Verbal - Respiration: Normal - Personal Care Assistance - bathing, toileting, dressing - Active, Group participation - Skin: Decubitis ulcer (sacral, Stage I pressure), Bilateral lower extremity venous stasis with RLE ulceration, bilateral unna boot dressings - Nutrition: DM diet  Past Medical History:  Diagnosis Date  . Acquired hypothyroidism    a. s/p thyroidectomy.  . Arthritis   . Depression   . Diabetes mellitus without complication (HCC)   . GERD (gastroesophageal reflux disease)   . Hyperlipidemia   . Hypertension   . Vitamin D deficiency    Past Surgical History:  Procedure Laterality Date  . colonscopy    . THYROIDECTOMY, PARTIAL     Social History   Social History  . Marital status: Single    Spouse name: N/A  . Number of children: N/A  . Years of education: N/A   Occupational History  . Not on file.   Social History Main Topics  . Smoking status: Never Smoker  . Smokeless tobacco: Never Used  . Alcohol use No  . Drug use: No  . Sexual activity: Not on file   Other Topics Concern  . Not on file   Social History Narrative  . No narrative on file   Family History  Problem Relation Age of Onset  . Diabetes Mother   . Cancer Father    Current Outpatient Prescriptions on File Prior to Visit  Medication Sig  . aspirin 81 MG tablet Take 1 tablet (81 mg total) by mouth daily.  Marland Kitchen atorvastatin (LIPITOR) 20 MG tablet Take 1 tablet (20 mg  total) by mouth daily.  . cephALEXin (KEFLEX) 500 MG capsule Take 1 capsule (500 mg total) by mouth 3 (three) times daily. For 7 days  . levothyroxine (SYNTHROID, LEVOTHROID) 88 MCG tablet Take 2 tablets (176 mcg total) by mouth daily before breakfast.  . lisinopril (PRINIVIL,ZESTRIL) 10 MG tablet Take 1 tablet (10 mg total) by mouth daily.  . metFORMIN (GLUCOPHAGE) 500 MG tablet Take 1 tablet (500 mg total) by mouth 2 (two) times daily with a meal.  . oxyCODONE-acetaminophen  (PERCOCET/ROXICET) 5-325 MG tablet Take 1-2 tablets by mouth every 4 (four) hours as needed for moderate pain.  Marland Kitchen PARoxetine (PAXIL) 30 MG tablet Take 1 tablet (30 mg total) by mouth daily.  . ranitidine (ZANTAC) 150 MG tablet Take 1 tablet (150 mg total) by mouth at bedtime.  . traZODone (DESYREL) 50 MG tablet Take 50 mg by mouth at bedtime.   Marland Kitchen acetaminophen (TYLENOL) 500 MG tablet Take 500 mg by mouth 2 (two) times daily as needed.  . Cholecalciferol 2000 units CAPS Take 1 capsule (2,000 Units total) by mouth daily. (Patient not taking: Reported on 04/08/2017)  . docusate sodium (COLACE) 100 MG capsule Take 1 capsule (100 mg total) by mouth 2 (two) times daily as needed for mild constipation. (Patient not taking: Reported on 04/08/2017)  . mineral oil-hydrophilic petrolatum (AQUAPHOR) ointment Apply topically as needed for dry skin. Apply to legs daily. (Patient not taking: Reported on 04/08/2017)   No current facility-administered medications on file prior to visit.     Review of Systems  Constitutional: Positive for activity change. Negative for appetite change, chills, diaphoresis, fatigue, fever and unexpected weight change.  HENT: Negative for congestion and hearing loss.   Eyes: Negative for visual disturbance.  Respiratory: Negative for apnea, cough, chest tightness, shortness of breath and wheezing.   Cardiovascular: Positive for leg swelling. Negative for chest pain and palpitations.  Gastrointestinal: Negative for abdominal pain, anal bleeding, blood in stool, constipation, diarrhea, nausea and vomiting.  Endocrine: Negative for cold intolerance and polyuria.  Genitourinary: Positive for urgency. Negative for decreased urine volume, difficulty urinating, dysuria, frequency and hematuria.  Musculoskeletal: Positive for arthralgias and gait problem. Negative for neck pain.  Skin: Positive for wound. Negative for rash.  Allergic/Immunologic: Negative for environmental allergies.    Neurological: Negative for dizziness, weakness, light-headedness, numbness and headaches.  Hematological: Negative for adenopathy.  Psychiatric/Behavioral: Negative for behavioral problems, dysphoric mood and sleep disturbance.   Per HPI unless specifically indicated above     Objective:    BP 136/80 (BP Location: Left Arm, Patient Position: Sitting, Cuff Size: Normal)   Pulse 76   Temp 98.2 F (36.8 C) (Oral)   Ht 5\' 7"  (1.702 m)   Wt 261 lb (118.4 kg) Comment: pt reported  BMI 40.88 kg/m   Wt Readings from Last 3 Encounters:  04/08/17 261 lb (118.4 kg)  03/01/17 250 lb (113.4 kg)  06/21/16 264 lb 14.4 oz (120.2 kg)    Physical Exam  Constitutional: He is oriented to person, place, and time. He appears well-developed and well-nourished. No distress.  Chronically ill 66 year old male, comfortable, cooperative, obese  HENT:  Head: Normocephalic and atraumatic.  Mouth/Throat: Oropharynx is clear and moist.  Eyes: Conjunctivae are normal. Right eye exhibits no discharge. Left eye exhibits no discharge.  Neck: Normal range of motion. Neck supple. No thyromegaly present.  Cardiovascular: Normal rate, regular rhythm, normal heart sounds and intact distal pulses.   No murmur heard. Pulmonary/Chest: Effort normal  and breath sounds normal. No respiratory distress. He has no wheezes. He has no rales.  Abdominal: Soft. Bowel sounds are normal. He exhibits no distension. There is no tenderness.  Musculoskeletal: Normal range of motion. He exhibits edema (Significant bilateral symmetrical +2-3 pitting edema above knees, some weeping RLE, wearing medicated unna boot wraps).  Lymphadenopathy:    He has no cervical adenopathy.  Neurological: He is alert and oriented to person, place, and time.  Skin: Skin is warm and dry. No rash noted. He is not diaphoretic. There is erythema (sacral decubitus).  Psychiatric: He has a normal mood and affect. His behavior is normal.  Well groomed, good eye  contact, normal speech and thoughts  Nursing note and vitals reviewed.  Results for orders placed or performed during the hospital encounter of 03/01/17  Basic metabolic panel  Result Value Ref Range   Sodium 137 135 - 145 mmol/L   Potassium 4.6 3.5 - 5.1 mmol/L   Chloride 99 (L) 101 - 111 mmol/L   CO2 21 (L) 22 - 32 mmol/L   Glucose, Bld 147 (H) 65 - 99 mg/dL   BUN 42 (H) 6 - 20 mg/dL   Creatinine, Ser 1.61 (H) 0.61 - 1.24 mg/dL   Calcium 9.4 8.9 - 09.6 mg/dL   GFR calc non Af Amer 36 (L) >60 mL/min   GFR calc Af Amer 42 (L) >60 mL/min   Anion gap 17 (H) 5 - 15  Hepatic function panel  Result Value Ref Range   Total Protein 8.3 (H) 6.5 - 8.1 g/dL   Albumin 4.1 3.5 - 5.0 g/dL   AST 89 (H) 15 - 41 U/L   ALT 25 17 - 63 U/L   Alkaline Phosphatase 137 (H) 38 - 126 U/L   Total Bilirubin 1.6 (H) 0.3 - 1.2 mg/dL   Bilirubin, Direct 0.2 0.1 - 0.5 mg/dL   Indirect Bilirubin 1.4 (H) 0.3 - 0.9 mg/dL  Troponin I  Result Value Ref Range   Troponin I 3.50 (HH) <0.03 ng/mL  CBC with Differential  Result Value Ref Range   WBC 17.0 (H) 3.8 - 10.6 K/uL   RBC 4.80 4.40 - 5.90 MIL/uL   Hemoglobin 13.9 13.0 - 18.0 g/dL   HCT 04.5 40.9 - 81.1 %   MCV 86.4 80.0 - 100.0 fL   MCH 28.9 26.0 - 34.0 pg   MCHC 33.4 32.0 - 36.0 g/dL   RDW 91.4 (H) 78.2 - 95.6 %   Platelets 204 150 - 440 K/uL   Neutrophils Relative % 91 %   Neutro Abs 15.5 (H) 1.4 - 6.5 K/uL   Lymphocytes Relative 4 %   Lymphs Abs 0.6 (L) 1.0 - 3.6 K/uL   Monocytes Relative 5 %   Monocytes Absolute 0.8 0.2 - 1.0 K/uL   Eosinophils Relative 0 %   Eosinophils Absolute 0.0 0 - 0.7 K/uL   Basophils Relative 0 %   Basophils Absolute 0.1 0 - 0.1 K/uL  Urinalysis, Complete w Microscopic  Result Value Ref Range   Color, Urine YELLOW (A) YELLOW   APPearance HAZY (A) CLEAR   Specific Gravity, Urine 1.019 1.005 - 1.030   pH 5.0 5.0 - 8.0   Glucose, UA NEGATIVE NEGATIVE mg/dL   Hgb urine dipstick LARGE (A) NEGATIVE   Bilirubin Urine  NEGATIVE NEGATIVE   Ketones, ur 20 (A) NEGATIVE mg/dL   Protein, ur 213 (A) NEGATIVE mg/dL   Nitrite NEGATIVE NEGATIVE   Leukocytes, UA NEGATIVE NEGATIVE   RBC /  HPF 0-5 0 - 5 RBC/hpf   WBC, UA 0-5 0 - 5 WBC/hpf   Bacteria, UA RARE (A) NONE SEEN   Squamous Epithelial / LPF 0-5 (A) NONE SEEN   Mucus PRESENT   CK  Result Value Ref Range   Total CK 4,083 (H) 49 - 397 U/L  APTT  Result Value Ref Range   aPTT 27 24 - 36 seconds  Protime-INR  Result Value Ref Range   Prothrombin Time 15.9 (H) 11.4 - 15.2 seconds   INR 1.26   Lipid panel  Result Value Ref Range   Cholesterol 169 0 - 200 mg/dL   Triglycerides 191 (H) <150 mg/dL   HDL 51 >47 mg/dL   Total CHOL/HDL Ratio 3.3 RATIO   VLDL 32 0 - 40 mg/dL   LDL Cholesterol 86 0 - 99 mg/dL  Troponin I  Result Value Ref Range   Troponin I 5.67 (HH) <0.03 ng/mL  Troponin I  Result Value Ref Range   Troponin I 6.88 (HH) <0.03 ng/mL  Troponin I  Result Value Ref Range   Troponin I 8.27 (HH) <0.03 ng/mL  Basic metabolic panel  Result Value Ref Range   Sodium 136 135 - 145 mmol/L   Potassium 3.8 3.5 - 5.1 mmol/L   Chloride 106 101 - 111 mmol/L   CO2 22 22 - 32 mmol/L   Glucose, Bld 107 (H) 65 - 99 mg/dL   BUN 38 (H) 6 - 20 mg/dL   Creatinine, Ser 8.29 (H) 0.61 - 1.24 mg/dL   Calcium 8.2 (L) 8.9 - 10.3 mg/dL   GFR calc non Af Amer 49 (L) >60 mL/min   GFR calc Af Amer 56 (L) >60 mL/min   Anion gap 8 5 - 15  CK  Result Value Ref Range   Total CK 5,019 (H) 49 - 397 U/L  Heparin level (unfractionated)  Result Value Ref Range   Heparin Unfractionated 0.12 (L) 0.30 - 0.70 IU/mL  Glucose, capillary  Result Value Ref Range   Glucose-Capillary 117 (H) 65 - 99 mg/dL  CBC  Result Value Ref Range   WBC 12.9 (H) 3.8 - 10.6 K/uL   RBC 4.14 (L) 4.40 - 5.90 MIL/uL   Hemoglobin 11.8 (L) 13.0 - 18.0 g/dL   HCT 56.2 (L) 13.0 - 86.5 %   MCV 85.4 80.0 - 100.0 fL   MCH 28.5 26.0 - 34.0 pg   MCHC 33.4 32.0 - 36.0 g/dL   RDW 78.4 (H) 69.6 -  14.5 %   Platelets 173 150 - 440 K/uL  Heparin level (unfractionated)  Result Value Ref Range   Heparin Unfractionated 0.22 (L) 0.30 - 0.70 IU/mL  Troponin I  Result Value Ref Range   Troponin I 7.87 (HH) <0.03 ng/mL  Troponin I  Result Value Ref Range   Troponin I 6.61 (HH) <0.03 ng/mL  Troponin I  Result Value Ref Range   Troponin I 4.75 (HH) <0.03 ng/mL  Heparin level (unfractionated)  Result Value Ref Range   Heparin Unfractionated 0.15 (L) 0.30 - 0.70 IU/mL  CBC  Result Value Ref Range   WBC 9.0 3.8 - 10.6 K/uL   RBC 3.92 (L) 4.40 - 5.90 MIL/uL   Hemoglobin 11.5 (L) 13.0 - 18.0 g/dL   HCT 29.5 (L) 28.4 - 13.2 %   MCV 86.9 80.0 - 100.0 fL   MCH 29.2 26.0 - 34.0 pg   MCHC 33.6 32.0 - 36.0 g/dL   RDW 44.0 (H) 10.2 - 72.5 %  Platelets 160 150 - 440 K/uL  Basic metabolic panel  Result Value Ref Range   Sodium 138 135 - 145 mmol/L   Potassium 3.6 3.5 - 5.1 mmol/L   Chloride 107 101 - 111 mmol/L   CO2 22 22 - 32 mmol/L   Glucose, Bld 113 (H) 65 - 99 mg/dL   BUN 30 (H) 6 - 20 mg/dL   Creatinine, Ser 9.56 0.61 - 1.24 mg/dL   Calcium 8.1 (L) 8.9 - 10.3 mg/dL   GFR calc non Af Amer >60 >60 mL/min   GFR calc Af Amer >60 >60 mL/min   Anion gap 9 5 - 15  CK  Result Value Ref Range   Total CK 1,719 (H) 49 - 397 U/L  Heparin level (unfractionated)  Result Value Ref Range   Heparin Unfractionated 0.37 0.30 - 0.70 IU/mL  Heparin level (unfractionated)  Result Value Ref Range   Heparin Unfractionated 0.35 0.30 - 0.70 IU/mL  CBC  Result Value Ref Range   WBC 8.2 3.8 - 10.6 K/uL   RBC 4.05 (L) 4.40 - 5.90 MIL/uL   Hemoglobin 12.0 (L) 13.0 - 18.0 g/dL   HCT 21.3 (L) 08.6 - 57.8 %   MCV 88.6 80.0 - 100.0 fL   MCH 29.7 26.0 - 34.0 pg   MCHC 33.5 32.0 - 36.0 g/dL   RDW 46.9 (H) 62.9 - 52.8 %   Platelets 153 150 - 440 K/uL  Basic metabolic panel  Result Value Ref Range   Sodium 138 135 - 145 mmol/L   Potassium 4.1 3.5 - 5.1 mmol/L   Chloride 108 101 - 111 mmol/L   CO2 24  22 - 32 mmol/L   Glucose, Bld 111 (H) 65 - 99 mg/dL   BUN 24 (H) 6 - 20 mg/dL   Creatinine, Ser 4.13 0.61 - 1.24 mg/dL   Calcium 8.5 (L) 8.9 - 10.3 mg/dL   GFR calc non Af Amer >60 >60 mL/min   GFR calc Af Amer >60 >60 mL/min   Anion gap 6 5 - 15  CK  Result Value Ref Range   Total CK 758 (H) 49 - 397 U/L  ECHOCARDIOGRAM COMPLETE  Result Value Ref Range   Weight 4,000 oz   Height 67 in   BP 119/64 mmHg      Assessment & Plan:   Problem List Items Addressed This Visit    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)   Moderate depressive disorder   Essential hypertension   Relevant Medications   furosemide (LASIX) 20 MG tablet   Degenerative joint disease (DJD) of lumbar spine   Chronic venous insufficiency   Relevant Medications   furosemide (LASIX) 20 MG tablet   Chronic low back pain    Other Visit Diagnoses    Infected stasis ulcer of right lower extremity (HCC)    -  Primary   Generalized weakness       Physical deconditioning       History of recent fall          Plan: - No med changes at this time - Recommended to continue Lasix for swelling, may titrate up dose in SNF if indicated based on response, expect some edema to improve if resume PT - Finish Keflex 500mg  TID x 7 days - Wound care requested, may continue unna boot wraps or other dressings  Meds ordered this encounter  Medications  . DISCONTD: furosemide (LASIX) 20 MG tablet  Sig: Take 20 mg by mouth.  . furosemide (LASIX) 20 MG tablet    Sig: Take 1 tablet (20 mg total) by mouth daily.    Dispense:  30 tablet    Refill:  5    Follow up plan: Return in about 6 weeks (around 05/20/2017).  Forms completed (FL2 and PCS form), signed, dated and given back to patient/caregiver staff, copied for our chart.  Saralyn Pilar, DO [facility] 04/09/2017, 12:19 AM

## 2017-04-08 NOTE — Patient Instructions (Addendum)
Thank you for coming to the clinic today.  1. Completed FL2 today for admission into SNF - [facility]  2. Goals while at SNF - improve mobility outside wheelchair, with PT regularly. - Wound care for R lower extremity infected venous stasis ulcer, requires unna boot wrapping and wound care - Complete Keflex antibiotic for 7 days, first day 04/08/17 last day 04/15/17  - Continue adjusting fluid pill diuretic, lasix 20mg  daliy for now, sent refill to pharmacy  Please schedule a Follow-up Appointment to: Return in about 6 weeks (around 05/20/2017).  If you have any other questions or concerns, please feel free to call the clinic or send a message through MyChart. You may also schedule an earlier appointment if necessary.  Additionally, you may be receiving a survey about your experience at our clinic within a few days to 1 week by e-mail or mail. We value your feedback.  Saralyn Pilar, DO [facility], [place]

## 2017-04-09 ENCOUNTER — Emergency Department
Admission: EM | Admit: 2017-04-09 | Discharge: 2017-04-10 | Disposition: A | Discharge status: Home / Self Care | Payer: Medicare Other | Attending: Emergency Medicine | Admitting: Emergency Medicine

## 2017-04-09 ENCOUNTER — Encounter: Payer: Self-pay | Admitting: Emergency Medicine

## 2017-04-09 DIAGNOSIS — T148XXA Other injury of unspecified body region, initial encounter: Secondary | ICD-10-CM | POA: Insufficient documentation

## 2017-04-09 DIAGNOSIS — L24A9 Irritant contact dermatitis due friction or contact with other specified body fluids: Secondary | ICD-10-CM

## 2017-04-09 DIAGNOSIS — E1140 Type 2 diabetes mellitus with diabetic neuropathy, unspecified: Secondary | ICD-10-CM | POA: Diagnosis not present

## 2017-04-09 DIAGNOSIS — Y999 Unspecified external cause status: Secondary | ICD-10-CM | POA: Insufficient documentation

## 2017-04-09 DIAGNOSIS — R45851 Suicidal ideations: Secondary | ICD-10-CM

## 2017-04-09 DIAGNOSIS — Y939 Activity, unspecified: Secondary | ICD-10-CM | POA: Insufficient documentation

## 2017-04-09 DIAGNOSIS — L97909 Non-pressure chronic ulcer of unspecified part of unspecified lower leg with unspecified severity: Secondary | ICD-10-CM | POA: Insufficient documentation

## 2017-04-09 DIAGNOSIS — R531 Weakness: Secondary | ICD-10-CM | POA: Diagnosis not present

## 2017-04-09 DIAGNOSIS — X58XXXA Exposure to other specified factors, initial encounter: Secondary | ICD-10-CM | POA: Insufficient documentation

## 2017-04-09 DIAGNOSIS — E11622 Type 2 diabetes mellitus with other skin ulcer: Secondary | ICD-10-CM | POA: Insufficient documentation

## 2017-04-09 DIAGNOSIS — E039 Hypothyroidism, unspecified: Secondary | ICD-10-CM | POA: Insufficient documentation

## 2017-04-09 DIAGNOSIS — I872 Venous insufficiency (chronic) (peripheral): Secondary | ICD-10-CM | POA: Insufficient documentation

## 2017-04-09 DIAGNOSIS — M79661 Pain in right lower leg: Secondary | ICD-10-CM | POA: Diagnosis present

## 2017-04-09 DIAGNOSIS — I10 Essential (primary) hypertension: Secondary | ICD-10-CM | POA: Insufficient documentation

## 2017-04-09 DIAGNOSIS — Y929 Unspecified place or not applicable: Secondary | ICD-10-CM | POA: Diagnosis not present

## 2017-04-09 LAB — CBC WITH DIFFERENTIAL/PLATELET
BASOS ABS: 0 10*3/uL (ref 0–0.1)
BASOS PCT: 1 %
EOS ABS: 0.1 10*3/uL (ref 0–0.7)
EOS PCT: 1 %
HCT: 36.7 % — ABNORMAL LOW (ref 40.0–52.0)
HEMOGLOBIN: 12.2 g/dL — AB (ref 13.0–18.0)
Lymphocytes Relative: 9 %
Lymphs Abs: 0.8 10*3/uL — ABNORMAL LOW (ref 1.0–3.6)
MCH: 29.3 pg (ref 26.0–34.0)
MCHC: 33.2 g/dL (ref 32.0–36.0)
MCV: 88.2 fL (ref 80.0–100.0)
Monocytes Absolute: 0.7 10*3/uL (ref 0.2–1.0)
Monocytes Relative: 8 %
Neutro Abs: 7 10*3/uL — ABNORMAL HIGH (ref 1.4–6.5)
Neutrophils Relative %: 81 %
PLATELETS: 226 10*3/uL (ref 150–440)
RBC: 4.16 MIL/uL — AB (ref 4.40–5.90)
RDW: 17.8 % — ABNORMAL HIGH (ref 11.5–14.5)
WBC: 8.6 10*3/uL (ref 3.8–10.6)

## 2017-04-09 LAB — COMPREHENSIVE METABOLIC PANEL
ALBUMIN: 3.2 g/dL — AB (ref 3.5–5.0)
ALK PHOS: 214 U/L — AB (ref 38–126)
ALT: 19 U/L (ref 17–63)
AST: 28 U/L (ref 15–41)
Anion gap: 8 (ref 5–15)
BUN: 31 mg/dL — ABNORMAL HIGH (ref 6–20)
CALCIUM: 8.7 mg/dL — AB (ref 8.9–10.3)
CHLORIDE: 106 mmol/L (ref 101–111)
CO2: 23 mmol/L (ref 22–32)
CREATININE: 1.37 mg/dL — AB (ref 0.61–1.24)
GFR calc non Af Amer: 52 mL/min — ABNORMAL LOW (ref >60)
Glucose, Bld: 101 mg/dL — ABNORMAL HIGH (ref 65–99)
Potassium: 4.1 mmol/L (ref 3.5–5.1)
SODIUM: 137 mmol/L (ref 135–145)
Total Bilirubin: 1.2 mg/dL (ref 0.3–1.2)
Total Protein: 7.1 g/dL (ref 6.5–8.1)

## 2017-04-09 MED ORDER — ACETAMINOPHEN 500 MG PO TABS
1000.0000 mg | ORAL_TABLET | Freq: Once | ORAL | Status: AC
  Administered 2017-04-09: 1000 mg via ORAL
  Filled 2017-04-09: qty 2

## 2017-04-09 NOTE — ED Triage Notes (Signed)
Arrives with c/o bilateral lower leg pain.  Patient has history of legs weeping and has legs wrapped by home health nurse "every other day".  Patient was sent to ED for evaluation before going to [facility].  Patient has been living at home and was sent here to be admitted until "they can get things straightened out at [facility].  Patient states he is going to [facility] for 30 days of rehab .

## 2017-04-09 NOTE — ED Notes (Signed)
Patient presents to the ED with [facility] staff.  Staff member states his name is Clayton Green and he goes to patient's home to check on his mental health.  Staff member states that today patient reported new leg pain and new incontinence.  Staff member states he feels patient may need nursing home placement.  Staff member's number is [phone].  Patient reports he receives home health as well as [facility].  Patient's legs both appear well bandaged.

## 2017-04-09 NOTE — ED Notes (Signed)
SOC finished and TTS to pt att

## 2017-04-09 NOTE — ED Provider Notes (Signed)
[facility] Emergency Department Provider Note       Time seen: ----------------------------------------- 7:00 PM on 04/09/2017 -----------------------------------------     I have reviewed the triage vital signs and the nursing notes.   HISTORY   Chief Complaint Leg Pain    HPI Clayton Green. is a 66 y.o. male who presents to the ED for bilateral lower leg pain. Patient has a history of weeping lesions on his legs and has them wrapped by the home health nurse every other day. Patient was sent to the ER for evaluation before going to [facility]. Currently he is living at home and was sent here for further evaluation.   Past Medical History:  Diagnosis Date  . Acquired hypothyroidism    a. s/p thyroidectomy.  . Arthritis   . Depression   . Diabetes mellitus without complication (HCC)   . GERD (gastroesophageal reflux disease)   . Hyperlipidemia   . Hypertension   . Vitamin D deficiency     Patient Active Problem List   Diagnosis Date Noted  . NSTEMI (non-ST elevated myocardial infarction) (HCC) 03/01/2017  . Hyperlipidemia associated with type 2 diabetes mellitus (HCC) 09/11/2016  . Chronic low back pain 09/11/2016  . Degenerative joint disease (DJD) of lumbar spine 09/11/2016  . GERD (gastroesophageal reflux disease) 08/12/2016  . Chronic venous insufficiency 08/05/2016  . Elevated serum creatinine 08/05/2016  . Biliary colic 06/21/2016  . Cholelithiasis 06/21/2016  . 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 Vitamin D deficiency 03/28/2015  . 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 Essential hypertension 03/28/2015    Past Surgical History:  Procedure Laterality Date  . colonscopy    . THYROIDECTOMY, PARTIAL      Allergies Shellfish allergy  Social History Social History  Substance Use Topics  . Smoking  status: Never Smoker  . Smokeless tobacco: Never Used  . Alcohol use No    Review of Systems Constitutional: Negative for fever. Eyes: Negative for vision changes ENT:  Negative for congestion, sore throat Cardiovascular: Negative for chest pain. Respiratory: Negative for shortness of breath. Gastrointestinal: Negative for abdominal pain, positive for nausea Genitourinary: Negative for dysuria. Musculoskeletal: Negative for back pain. Skin: positive for draining wounds on his lower legs Neurological: Negative for headaches, focal weakness or numbness.  All systems negative/normal/unremarkable except as stated in the HPI  ____________________________________________   PHYSICAL EXAM:  VITAL SIGNS: ED Triage Vitals  Enc Vitals Group     BP 04/09/17 1444 (!) 149/79     Pulse Rate 04/09/17 1444 73     Resp 04/09/17 1444 16     Temp 04/09/17 1444 98.6 F (37 C)     Temp Source 04/09/17 1444 Oral     SpO2 04/09/17 1444 99 %     Weight 04/09/17 1448 261 lb (118.4 kg)     Height 04/09/17 1448 5\' 7"  (1.702 m)     Head Circumference --      Peak Flow --      Pain Score 04/09/17 1448 7     Pain Loc --      Pain Edu? --      Excl. in GC? --    Constitutional: Alert and oriented. Well appearing and in no distress. Eyes: Conjunctivae are normal. Normal extraocular movements. Cardiovascular: Normal rate, regular rhythm. No murmurs, rubs, or gallops. Respiratory: Normal respiratory effort without tachypnea nor retractions. Breath  sounds are clear and equal bilaterally. No wheezes/rales/rhonchi. Gastrointestinal: Soft and nontender. Normal bowel sounds Musculoskeletal: bilateral medicated wraps are appreciated below the knee on both legs Neurologic:  Normal speech and language. No gross focal neurologic deficits are appreciated.  Skin:  Skin ulcerations that are mild on the left leg and worse on the right leg. There is some erythema in his right distal tibia as well as the dorsum of  his right foot  Psychiatric: Mood and affect are normal. Speech and behavior are normal.  ____________________________________________  ED COURSE:  Pertinent labs & imaging results that were available during my care of the patient were reviewed by me and considered in my medical decision making (see chart for details). Patient presents for wound care and possible social work issues, we will assess with labs and likely return the patient home.overall his wounds did not appear to be significantly worsening. We have reapplied Unna boot dressings and he is stable to return home.   Procedures ____________________________________________   LABS (pertinent positives/negatives)  Labs Reviewed  CBC WITH DIFFERENTIAL/PLATELET - Abnormal; Notable for the following:       Result Value   RBC 4.16 (*)    Hemoglobin 12.2 (*)    HCT 36.7 (*)    RDW 17.8 (*)    Neutro Abs 7.0 (*)    Lymphs Abs 0.8 (*)    All other components within normal limits  COMPREHENSIVE METABOLIC PANEL - Abnormal; Notable for the following:    Glucose, Bld 101 (*)    BUN 31 (*)    Creatinine, Ser 1.37 (*)    Calcium 8.7 (*)    Albumin 3.2 (*)    Alkaline Phosphatase 214 (*)    GFR calc non Af Amer 52 (*)    All other components within normal limits  ____________________________________________  FINAL ASSESSMENT AND PLAN  Wound recheck, weakness  Plan: Patient's labs were dictated above. Patient had presented for weakness as well as a recheck of his leg wounds which are being managed by home health nurse. We have reapplied Unna boot dressings and he is stable to return home until he can be placed into a rehabilitation facility.   Emily Filbert, MD   Note: This note was generated in part or whole with voice recognition software. Voice recognition is usually quite accurate but there are transcription errors that can and very often do occur. I apologize for any typographical errors that were not detected and  corrected.     Emily Filbert, MD 04/09/17 [phone]

## 2017-04-09 NOTE — ED Provider Notes (Signed)
Clinical Course as of Apr 09 2338  Clayton Green Apr 09, 2017  2018 Patient now stating he wants to kill himself if he has to go home.  [JW]  [facility] evaluated patient and sent report with diagnosis of Adjustment Disorder.  Felt patient does not meet IVC nor inpatient psychiatric treatment criteria.  SOC further claims that the patient is willing and able to go home and is requesting discharge.  The patient's nurse will work with him and any available family.  If the patient can and will go home, he may do so.  Otherwise we will keep him for social work consult tomorrow.  [CF]    Clinical Course User Index [CF] Loleta Rose, MD [JW] Emily Filbert, MD       Loleta Rose, MD 04/09/17 [phone]

## 2017-04-10 NOTE — ED Provider Notes (Signed)
No one in the family is able to pick up the patient to take him home. The patient lives at home alone and he is on guardian. He was cleared by psych that he was not suicidal. The patient is wheelchair-bound and he states that he is ready to go home. We will discharge the patient and have him return home by ambulance. The patient has been sitting up in no acute distress.   Rebecka Apley, MD 04/10/17 [phone]

## 2017-04-12 ENCOUNTER — Emergency Department
Admission: EM | Admit: 2017-04-12 | Discharge: 2017-04-14 | Disposition: A | Discharge status: Skilled Nursing Facility | Payer: Medicare Other | Attending: Emergency Medicine | Admitting: Emergency Medicine

## 2017-04-12 ENCOUNTER — Encounter: Payer: Self-pay | Admitting: Medical Oncology

## 2017-04-12 DIAGNOSIS — R112 Nausea with vomiting, unspecified: Secondary | ICD-10-CM

## 2017-04-12 DIAGNOSIS — Z7984 Long term (current) use of oral hypoglycemic drugs: Secondary | ICD-10-CM | POA: Diagnosis not present

## 2017-04-12 DIAGNOSIS — Z7982 Long term (current) use of aspirin: Secondary | ICD-10-CM | POA: Diagnosis not present

## 2017-04-12 DIAGNOSIS — E039 Hypothyroidism, unspecified: Secondary | ICD-10-CM | POA: Diagnosis not present

## 2017-04-12 DIAGNOSIS — Z79899 Other long term (current) drug therapy: Secondary | ICD-10-CM | POA: Diagnosis not present

## 2017-04-12 DIAGNOSIS — I10 Essential (primary) hypertension: Secondary | ICD-10-CM | POA: Diagnosis not present

## 2017-04-12 DIAGNOSIS — R197 Diarrhea, unspecified: Secondary | ICD-10-CM

## 2017-04-12 DIAGNOSIS — E119 Type 2 diabetes mellitus without complications: Secondary | ICD-10-CM | POA: Insufficient documentation

## 2017-04-12 LAB — COMPREHENSIVE METABOLIC PANEL
ALBUMIN: 3.3 g/dL — AB (ref 3.5–5.0)
ALK PHOS: 227 U/L — AB (ref 38–126)
ALT: 20 U/L (ref 17–63)
ANION GAP: 10 (ref 5–15)
AST: 28 U/L (ref 15–41)
BILIRUBIN TOTAL: 1.1 mg/dL (ref 0.3–1.2)
BUN: 29 mg/dL — ABNORMAL HIGH (ref 6–20)
CALCIUM: 8.9 mg/dL (ref 8.9–10.3)
CO2: 23 mmol/L (ref 22–32)
Chloride: 106 mmol/L (ref 101–111)
Creatinine, Ser: 1.32 mg/dL — ABNORMAL HIGH (ref 0.61–1.24)
GFR calc Af Amer: >60 mL/min (ref >60)
GFR, EST NON AFRICAN AMERICAN: 55 mL/min — AB (ref >60)
GLUCOSE: 99 mg/dL (ref 65–99)
POTASSIUM: 4 mmol/L (ref 3.5–5.1)
Sodium: 139 mmol/L (ref 135–145)
TOTAL PROTEIN: 7.2 g/dL (ref 6.5–8.1)

## 2017-04-12 LAB — CBC
HCT: 39.1 % — ABNORMAL LOW (ref 40.0–52.0)
HEMOGLOBIN: 12.7 g/dL — AB (ref 13.0–18.0)
MCH: 29.3 pg (ref 26.0–34.0)
MCHC: 32.5 g/dL (ref 32.0–36.0)
MCV: 90 fL (ref 80.0–100.0)
Platelets: 266 10*3/uL (ref 150–440)
RBC: 4.34 MIL/uL — ABNORMAL LOW (ref 4.40–5.90)
RDW: 18.4 % — AB (ref 11.5–14.5)
WBC: 11.4 10*3/uL — AB (ref 3.8–10.6)

## 2017-04-12 LAB — LIPASE, BLOOD: Lipase: 25 U/L (ref 11–51)

## 2017-04-12 MED ORDER — SODIUM CHLORIDE 0.9 % IV BOLUS (SEPSIS)
1000.0000 mL | Freq: Once | INTRAVENOUS | Status: AC
  Administered 2017-04-12: 1000 mL via INTRAVENOUS

## 2017-04-12 MED ORDER — ONDANSETRON HCL 4 MG/2ML IJ SOLN
4.0000 mg | Freq: Once | INTRAMUSCULAR | Status: AC
  Administered 2017-04-12: 4 mg via INTRAVENOUS
  Filled 2017-04-12: qty 2

## 2017-04-12 NOTE — NC FL2 (Signed)
[place] MEDICAID FL2 LEVEL OF CARE SCREENING TOOL     IDENTIFICATION  Patient Name: Clayton Green. Birthdate: February 19, 1951 Sex: male Admission Date (Current Location): 04/12/2017  [place] and [place] Number:  Chiropodist and Address:  [facility], [address] [place], [place] [address]      Provider Number: [id number]  Attending Physician Name and Address:  Nita Sickle, MD  Relative Name and Phone Number:       Current Level of Care: Hospital Recommended Level of Care: Skilled Nursing Facility Prior Approval Number:    Date Approved/Denied:   PASRR Number:    Discharge Plan: SNF    Current Diagnoses: Patient Active Problem List   Diagnosis Date Noted  . NSTEMI (non-ST elevated myocardial infarction) (HCC) 03/01/2017  . Hyperlipidemia associated with type 2 diabetes mellitus (HCC) 09/11/2016  . Chronic low back pain 09/11/2016  . Degenerative joint disease (DJD) of lumbar spine 09/11/2016  . GERD (gastroesophageal reflux disease) 08/12/2016  . Chronic venous insufficiency 08/05/2016  . Elevated serum creatinine 08/05/2016  . Biliary colic 06/21/2016  . Cholelithiasis 06/21/2016  . 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 Vitamin D deficiency 03/28/2015  . 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 Essential hypertension 03/28/2015    Orientation RESPIRATION BLADDER Height & Weight     Self, Time, Situation, Place  Normal Incontinent Weight: 261 lb (118.4 kg) Height:  5\' 7"  (170.2 cm)  BEHAVIORAL SYMPTOMS/MOOD NEUROLOGICAL BOWEL NUTRITION STATUS   (none)  (none) Continent Diet  AMBULATORY STATUS COMMUNICATION OF NEEDS Skin   Limited Assist Verbally Normal                       Personal Care Assistance Level of Assistance  Bathing, Dressing Bathing Assistance: Limited assistance   Dressing Assistance:  Limited assistance     Functional Limitations Info             SPECIAL CARE FACTORS FREQUENCY  PT (By licensed PT)                    Contractures Contractures Info: Not present    Additional Factors Info  Allergies   Allergies Info: shellfish           Current Medications (04/12/2017):  This is the current hospital active medication list Current Facility-Administered Medications  Medication Dose Route Frequency Provider Last Rate Last Dose  . ondansetron (ZOFRAN) injection 4 mg  4 mg Intravenous Once [place], [place], MD      . sodium chloride 0.9 % bolus 1,000 mL  1,000 mL Intravenous Once Nita Sickle, MD       Current Outpatient Prescriptions  Medication Sig Dispense Refill  . acetaminophen (TYLENOL) 500 MG tablet Take 500 mg by mouth 2 (two) times daily as needed.    Marland Kitchen aspirin 81 MG tablet Take 1 tablet (81 mg total) by mouth daily. 30 tablet 11  . atorvastatin (LIPITOR) 20 MG tablet Take 1 tablet (20 mg total) by mouth daily. 30 tablet 5  . cephALEXin (KEFLEX) 500 MG capsule Take 1 capsule (500 mg total) by mouth 3 (three) times daily. For 7 days 21 capsule 0  . Cholecalciferol 2000 units CAPS Take 1 capsule (2,000 Units total) by mouth daily. (Patient not taking: Reported on 04/08/2017) 90 each 3  . docusate sodium (COLACE) 100 MG capsule Take 1 capsule (100 mg  total) by mouth 2 (two) times daily as needed for mild constipation. (Patient not taking: Reported on 04/08/2017) 10 capsule 0  . furosemide (LASIX) 20 MG tablet Take 1 tablet (20 mg total) by mouth daily. 30 tablet 5  . levothyroxine (SYNTHROID, LEVOTHROID) 88 MCG tablet Take 2 tablets (176 mcg total) by mouth daily before breakfast. 180 tablet 3  . lisinopril (PRINIVIL,ZESTRIL) 10 MG tablet Take 1 tablet (10 mg total) by mouth daily. 30 tablet 11  . metFORMIN (GLUCOPHAGE) 500 MG tablet Take 1 tablet (500 mg total) by mouth 2 (two) times daily with a meal. 60 tablet 11  . mineral oil-hydrophilic  petrolatum (AQUAPHOR) ointment Apply topically as needed for dry skin. Apply to legs daily. (Patient not taking: Reported on 04/08/2017) 420 g 0  . oxyCODONE-acetaminophen (PERCOCET/ROXICET) 5-325 MG tablet Take 1-2 tablets by mouth every 4 (four) hours as needed for moderate pain. 30 tablet 0  . PARoxetine (PAXIL) 30 MG tablet Take 1 tablet (30 mg total) by mouth daily. 30 tablet 11  . ranitidine (ZANTAC) 150 MG tablet Take 1 tablet (150 mg total) by mouth at bedtime. 30 tablet 11  . traZODone (DESYREL) 50 MG tablet Take 50 mg by mouth at bedtime.        Discharge Medications: Please see discharge summary for a list of discharge medications.  Relevant Imaging Results:  Relevant Lab Results:   Additional Information ss: [id number]  York Spaniel, LCSW

## 2017-04-12 NOTE — Care Management Note (Addendum)
Case Management Note  Patient Details  Name: Clayton Green. MRN: [id number] Date of Birth: 05-10-1951  Subjective/Objective:   Spoke to case worker at bedside and patient. The patient wants to go to a higher level of care , and has already been made aware that he may have to pay out of pocket since Medicare is primary. They together got an FL2 completed at the PCP on Friday of last week, but do not have a PASAR number. The aide who helps the patient with his leg dressings told him if he came to hospital we could help place him with CSW and that Medicaid would pay for his care in a facility.They seem to believe the pt. Has longterm care.  The case worker is gone to get his copy of the FL2, and I have asked the MD to consult CSW here to see what they can give the patient for information.  The patient aide from [facility] , Bernette Redbird says that they have already identified someplace that would take him. He has not divulged which facility it is. Either way the patient has been told he will probably have to pay for a bed if one is found.                Action/Plan:   Expected Discharge Date:                  Expected Discharge Plan:     In-House Referral:     Discharge planning Services     Post Acute Care Choice:    Choice offered to:     DME Arranged:    DME Agency:     HH Arranged:    HH Agency:     Status of Service:     If discussed at [employer] of Stay Meetings, dates discussed:    Additional Comments:  Berna Bue, RN 04/12/2017, 2:13 PM

## 2017-04-12 NOTE — Clinical Social Work Placement (Signed)
   CLINICAL SOCIAL WORK PLACEMENT  NOTE  Date:  04/12/2017  Patient Details  Name: Clayton Green. MRN: [id number] Date of Birth: [birth year]  Clinical Social Work is seeking post-discharge placement for this patient at the Skilled  Nursing Facility level of care (*CSW will initial, date and re-position this form in  chart as items are completed):  Yes   Patient/family provided with [facility] Clinical Social Work Department's list of facilities offering this level of care within the geographic area requested by the patient (or if unable, by the patient's family).  Yes   Patient/family informed of their freedom to choose among providers that offer the needed level of care, that participate in Medicare, Medicaid or managed care program needed by the patient, have an available bed and are willing to accept the patient.  Yes   Patient/family informed of [facility]'s ownership interest in [facility] and [facility], as well as of the fact that they are under no obligation to receive care at these facilities.  PASRR submitted to EDS on 04/12/17     PASRR number received on       Existing PASRR number confirmed on       FL2 transmitted to all facilities in geographic area requested by pt/family on 04/12/17     FL2 transmitted to all facilities within larger geographic area on       Patient informed that his/her managed care company has contracts with or will negotiate with certain facilities, including the following:        Yes   Patient/family informed of bed offers received.  Patient chooses bed at  [facility] )     Physician recommends and patient chooses bed at      Patient to be transferred to   on  .  Patient to be transferred to facility by       Patient family notified on   of transfer.  Name of family member notified:        PHYSICIAN       Additional Comment:    _______________________________________________ Adelin Ventrella, Darleen Crocker,  LCSW 04/12/2017, 4:40 PM

## 2017-04-12 NOTE — ED Triage Notes (Signed)
Pt reports that he has been having NVD for over 6 months, states that it has worsened over past 2 days. States that he was seen here 3 days ago for the same. Pt keeps repeating "i need to find out whats going on with me". Pt in NAD. Denies pain.

## 2017-04-12 NOTE — NC FL2 (Signed)
[place] MEDICAID FL2 LEVEL OF CARE SCREENING TOOL     IDENTIFICATION  Patient Name: Clayton Green. Birthdate: [birth year] Sex: male Admission Date (Current Location): 04/12/2017  [place] and [place] Number:  Chiropodist and Address:  [facility], [address], [place], [place] [address]      Provider Number: [id number]  Attending Physician Name and Address:  Nita Sickle, MD  Relative Name and Phone Number:       Current Level of Care: Hospital Recommended Level of Care: Skilled Nursing Facility Prior Approval Number:    Date Approved/Denied:   PASRR Number:    Discharge Plan: SNF    Current Diagnoses: Patient Active Problem List   Diagnosis Date Noted  . NSTEMI (non-ST elevated myocardial infarction) (HCC) 03/01/2017  . Hyperlipidemia associated with type 2 diabetes mellitus (HCC) 09/11/2016  . Chronic low back pain 09/11/2016  . Degenerative joint disease (DJD) of lumbar spine 09/11/2016  . GERD (gastroesophageal reflux disease) 08/12/2016  . Chronic venous insufficiency 08/05/2016  . Elevated serum creatinine 08/05/2016  . Biliary colic 06/21/2016  . Cholelithiasis 06/21/2016  . 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 Vitamin D deficiency 03/28/2015  . 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 Essential hypertension 03/28/2015    Orientation RESPIRATION BLADDER Height & Weight     Self, Time, Situation, Place  Normal Incontinent Weight: 261 lb (118.4 kg) Height:  5\' 7"  (170.2 cm)  BEHAVIORAL SYMPTOMS/MOOD NEUROLOGICAL BOWEL NUTRITION STATUS   (none)  (none) Continent Diet  AMBULATORY STATUS COMMUNICATION OF NEEDS Skin   Limited Assist Verbally Normal                       Personal Care Assistance Level of Assistance  Bathing, Dressing Bathing Assistance: Limited assistance   Dressing Assistance:  Limited assistance     Functional Limitations Info             SPECIAL CARE FACTORS FREQUENCY  PT (By licensed PT)                    Contractures Contractures Info: Not present    Additional Factors Info  Allergies   Allergies Info: shellfish           Current Medications (04/12/2017):  This is the current hospital active medication list No current facility-administered medications for this encounter.    Current Outpatient Prescriptions  Medication Sig Dispense Refill  . aspirin 81 MG tablet Take 1 tablet (81 mg total) by mouth daily. 30 tablet 11  . atorvastatin (LIPITOR) 20 MG tablet Take 1 tablet (20 mg total) by mouth daily. 30 tablet 5  . cephALEXin (KEFLEX) 500 MG capsule Take 1 capsule (500 mg total) by mouth 3 (three) times daily. For 7 days 21 capsule 0  . furosemide (LASIX) 20 MG tablet Take 1 tablet (20 mg total) by mouth daily. 30 tablet 5  . levothyroxine (SYNTHROID, LEVOTHROID) 88 MCG tablet Take 2 tablets (176 mcg total) by mouth daily before breakfast. 180 tablet 3  . lisinopril (PRINIVIL,ZESTRIL) 10 MG tablet Take 1 tablet (10 mg total) by mouth daily. 30 tablet 11  . meloxicam (MOBIC) 15 MG tablet Take 15 mg by mouth daily.    . metFORMIN (GLUCOPHAGE) 500 MG tablet Take 1 tablet (500 mg total) by mouth 2 (two) times daily with a meal. 60 tablet 11  .  mineral oil-hydrophilic petrolatum (AQUAPHOR) ointment Apply topically as needed for dry skin. Apply to legs daily. 420 g 0  . PARoxetine (PAXIL) 30 MG tablet Take 1 tablet (30 mg total) by mouth daily. 30 tablet 11  . ranitidine (ZANTAC) 150 MG tablet Take 1 tablet (150 mg total) by mouth at bedtime. 30 tablet 11  . traZODone (DESYREL) 50 MG tablet Take 50 mg by mouth at bedtime.     Marland Kitchen acetaminophen (TYLENOL) 500 MG tablet Take 500 mg by mouth 2 (two) times daily as needed.    . Cholecalciferol 2000 units CAPS Take 1 capsule (2,000 Units total) by mouth daily. (Patient not taking: Reported on 04/08/2017)  90 each 3  . docusate sodium (COLACE) 100 MG capsule Take 1 capsule (100 mg total) by mouth 2 (two) times daily as needed for mild constipation. (Patient not taking: Reported on 04/08/2017) 10 capsule 0  . oxyCODONE-acetaminophen (PERCOCET/ROXICET) 5-325 MG tablet Take 1-2 tablets by mouth every 4 (four) hours as needed for moderate pain. (Patient not taking: Reported on 04/12/2017) 30 tablet 0     Discharge Medications: Please see discharge summary for a list of discharge medications.  Relevant Imaging Results:  Relevant Lab Results:   Additional Information ss: [id number]  Bless Lisen[place], Darleen Crocker, LCSW

## 2017-04-12 NOTE — ED Provider Notes (Signed)
[facility] Emergency Department Provider Note  ____________________________________________  Time seen: Approximately 3:52 PM  I have reviewed the triage vital signs and the nursing notes.   HISTORY  Chief Complaint Nausea and Emesis   HPI Clayton Green. is a 66 y.o. male with a history of diabetes, hypertension, hyperlipidemia, depression who presents for evaluation of the nausea, vomiting and diarrhea. Patient reports that he has had these symptoms for a year but they have gotten worse today which prompted his visit to the emergency room. He endorses to daily episodes of watery diarrhea and to daily episodes of nonbloody nonbilious emesis. He also has associated diffuse moderate crampy abdominal pain that usually resolves after he has a bowel movement or vomitus. No pain at this time. No fever or chills, no dysuria or hematuria.no chest pain or shortness of breath. No prior abdominal surgeries.no personal or family history of IBS or IBD.  Past Medical History:  Diagnosis Date  . Acquired hypothyroidism    a. s/p thyroidectomy.  . Arthritis   . Depression   . Diabetes mellitus without complication (HCC)   . GERD (gastroesophageal reflux disease)   . Hyperlipidemia   . Hypertension   . Vitamin D deficiency     Patient Active Problem List   Diagnosis Date Noted  . NSTEMI (non-ST elevated myocardial infarction) (HCC) 03/01/2017  . Hyperlipidemia associated with type 2 diabetes mellitus (HCC) 09/11/2016  . Chronic low back pain 09/11/2016  . Degenerative joint disease (DJD) of lumbar spine 09/11/2016  . GERD (gastroesophageal reflux disease) 08/12/2016  . Chronic venous insufficiency 08/05/2016  . Elevated serum creatinine 08/05/2016  . Biliary colic 06/21/2016  . Cholelithiasis 06/21/2016  . 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 Vitamin D  deficiency 03/28/2015  . 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 Essential hypertension 03/28/2015    Past Surgical History:  Procedure Laterality Date  . colonscopy    . THYROIDECTOMY, PARTIAL      Prior to Admission medications   Medication Sig Start Date End Date Taking? Authorizing Provider  aspirin 81 MG tablet Take 1 tablet (81 mg total) by mouth daily. 09/21/16  Yes Karamalegos, Netta Neat, DO  atorvastatin (LIPITOR) 20 MG tablet Take 1 tablet (20 mg total) by mouth daily. 09/11/16  Yes Karamalegos, Netta Neat, DO  cephALEXin (KEFLEX) 500 MG capsule Take 1 capsule (500 mg total) by mouth 3 (three) times daily. For 7 days 04/01/17  Yes Karamalegos, Netta Neat, DO  furosemide (LASIX) 20 MG tablet Take 1 tablet (20 mg total) by mouth daily. 04/08/17  Yes Karamalegos, Netta Neat, DO  levothyroxine (SYNTHROID, LEVOTHROID) 88 MCG tablet Take 2 tablets (176 mcg total) by mouth daily before breakfast. 09/11/16  Yes Karamalegos, Netta Neat, DO  lisinopril (PRINIVIL,ZESTRIL) 10 MG tablet Take 1 tablet (10 mg total) by mouth daily. 08/12/16  Yes Karamalegos, Netta Neat, DO  meloxicam (MOBIC) 15 MG tablet Take 15 mg by mouth daily.   Yes [provider]  metFORMIN (GLUCOPHAGE) 500 MG tablet Take 1 tablet (500 mg total) by mouth 2 (two) times daily with a meal. 08/12/16  Yes Karamalegos, Netta Neat, DO  mineral oil-hydrophilic petrolatum (AQUAPHOR) ointment Apply topically as needed for dry skin. Apply to legs daily. 05/14/15  Yes Janeann Forehand., MD  PARoxetine (PAXIL) 30 MG tablet Take 1 tablet (30 mg total) by mouth daily. 08/12/16  Yes Saralyn Pilar  J, DO  ranitidine (ZANTAC) 150 MG tablet Take 1 tablet (150 mg total) by mouth at bedtime. 08/12/16  Yes Karamalegos, Netta Neat, DO  traZODone (DESYREL) 50 MG tablet Take 50 mg by mouth at bedtime.  03/07/15  Yes [provider]  acetaminophen (TYLENOL) 500 MG tablet Take 500 mg by mouth 2 (two) times daily as  needed.    [provider]  Cholecalciferol 2000 units CAPS Take 1 capsule (2,000 Units total) by mouth daily. Patient not taking: Reported on 04/08/2017 08/24/16   Smitty Cords, DO  docusate sodium (COLACE) 100 MG capsule Take 1 capsule (100 mg total) by mouth 2 (two) times daily as needed for mild constipation. Patient not taking: Reported on 04/08/2017 03/04/17   Auburn Bilberry, MD  oxyCODONE-acetaminophen (PERCOCET/ROXICET) 5-325 MG tablet Take 1-2 tablets by mouth every 4 (four) hours as needed for moderate pain. Patient not taking: Reported on 04/12/2017 03/04/17   Auburn Bilberry, MD    Allergies Shellfish allergy  Family History  Problem Relation Age of Onset  . Diabetes Mother   . Cancer Father     Social History Social History  Substance Use Topics  . Smoking status: Never Smoker  . Smokeless tobacco: Never Used  . Alcohol use No    Review of Systems  Constitutional: Negative for fever. Eyes: Negative for visual changes. ENT: Negative for sore throat. Neck: No neck pain  Cardiovascular: Negative for chest pain. Respiratory: Negative for shortness of breath. Gastrointestinal: + diffuse cramping abdominal pain, vomiting and diarrhea. Genitourinary: Negative for dysuria. Musculoskeletal: Negative for back pain. Skin: Negative for rash. Neurological: Negative for headaches, weakness or numbness. Psych: No SI or HI  ____________________________________________   PHYSICAL EXAM:  VITAL SIGNS: ED Triage Vitals  Enc Vitals Group     BP 04/12/17 1137 138/80     Pulse Rate 04/12/17 1137 99     Resp 04/12/17 1137 18     Temp 04/12/17 1137 98.7 F (37.1 C)     Temp Source 04/12/17 1137 Oral     SpO2 04/12/17 1137 99 %     Weight 04/12/17 1137 261 lb (118.4 kg)     Height 04/12/17 1137 5\' 7"  (1.702 m)     Head Circumference --      Peak Flow --      Pain Score 04/12/17 1402 8     Pain Loc --      Pain Edu? --      Excl. in GC? --      Constitutional: Alert and oriented. Well appearing and in no apparent distress. HEENT:      Head: Normocephalic and atraumatic.         Eyes: Conjunctivae are normal. Sclera is non-icteric.       Mouth/Throat: Mucous membranes are moist.       Neck: Supple with no signs of meningismus. Cardiovascular: Regular rate and rhythm. No murmurs, gallops, or rubs. 2+ symmetrical distal pulses are present in all extremities. No JVD. Respiratory: Normal respiratory effort. Lungs are clear to auscultation bilaterally. No wheezes, crackles, or rhonchi.  Gastrointestinal: Soft, non tender, and non distended with positive bowel sounds. No rebound or guarding. Musculoskeletal: Nontender with normal range of motion in all extremities. No edema, cyanosis, or erythema of extremities. Neurologic: Normal speech and language. Face is symmetric. Moving all extremities. No gross focal neurologic deficits are appreciated. Skin: Skin is warm, dry and intact. No rash noted. Psychiatric: Mood and affect are normal. Speech and behavior  are normal.  ____________________________________________   LABS (all labs ordered are listed, but only abnormal results are displayed)  Labs Reviewed  COMPREHENSIVE METABOLIC PANEL - Abnormal; Notable for the following:       Result Value   BUN 29 (*)    Creatinine, Ser 1.32 (*)    Albumin 3.3 (*)    Alkaline Phosphatase 227 (*)    GFR calc non Af Amer 55 (*)    All other components within normal limits  CBC - Abnormal; Notable for the following:    WBC 11.4 (*)    RBC 4.34 (*)    Hemoglobin 12.7 (*)    HCT 39.1 (*)    RDW 18.4 (*)    All other components within normal limits  LIPASE, BLOOD  URINALYSIS, COMPLETE (UACMP) WITH MICROSCOPIC   ____________________________________________  EKG  none  ____________________________________________  RADIOLOGY  none  ____________________________________________   PROCEDURES  Procedure(s) performed:  None Procedures Critical Care performed:  None ____________________________________________   INITIAL IMPRESSION / ASSESSMENT AND PLAN / ED COURSE  66 y.o. male with a history of diabetes, hypertension, hyperlipidemia, depression who presents for evaluation of the nausea, vomiting and diarrhea x 1 year. Patient is extremely well appearing, no distress, is normal vital signs, abdomen is soft with no tenderness throughout. Symptoms have been ongoing for a whole year and I do believe patient will need an endoscopy and colonoscopy for further evaluation of his symptoms. I will refer him to a GI specialist. Patient only has 2 watery stools a day there for C. Difficile is less likely area and in no indication for CT scan with normal labs and no tenderness on abdominal exam.we'll give IV fluids, Zofran and discharge patient on Imodium and Zofran and a referral to see a GI specialist.  Clinical Course as of Apr 12 1704  Mon Apr 12, 2017  1703 Fredric Mare, [place] has been able to find placement for the patient since he has been progressively weak at home and lives alone. Papers pending for bed approval. She recommended keeping patient in the ED until the papers are approved. Patient tolerating PO in the ED.   [CV]    Clinical Course User Index [CV] Don Perking [place], MD   _________________________ 5:05 PM on 04/12/2017 -----------------------------------------  Patient is medically cleared awaiting placement  Pertinent labs & imaging results that were available during my care of the patient were reviewed by me and considered in my medical decision making (see chart for details).    ____________________________________________   FINAL CLINICAL IMPRESSION(S) / ED DIAGNOSES  Final diagnoses:  Nausea vomiting and diarrhea      NEW MEDICATIONS STARTED DURING THIS VISIT:  New Prescriptions   No medications on file     Note:  This document was prepared using Dragon voice recognition software and  may include unintentional dictation errors.    Don Perking, [place], MD 04/12/17 [phone]

## 2017-04-12 NOTE — ED Notes (Signed)
Pt caseworker from [employer] here with pt. Pt caseworker reports that pt needs social worker consult so that he can find placement. Pt caseworker is Francia Greaves [phone]. Elnita Maxwell, RN Case Manager spoke with Mr. Stephanie Coup.

## 2017-04-12 NOTE — ED Notes (Signed)
Pt given extra pillow for comfort. Denies any needs at this time.

## 2017-04-12 NOTE — ED Notes (Signed)
Patient need to use the bathroom. Clayton Green ED tech helped patient with urinal.

## 2017-04-12 NOTE — Clinical Social Work Note (Signed)
Clinical Social Work Assessment  Patient Details  Name: Clayton Green. MRN: [id number] Date of Birth: 07-23-1951  Date of referral:  04/12/17               Reason for consult:  Facility Placement                Permission sought to share information with:  Chartered certified accountant granted to share information::  Yes, Verbal Permission Granted  Name::      Clayton Green::   [name]   Relationship::     Contact Information:     Housing/Transportation Living arrangements for the past 2 months:  [place] of Information:  Patient, Outpatient Provider Patient Interpreter Needed:  None Criminal Activity/Legal Involvement Pertinent to Current Situation/Hospitalization:  No - Comment as needed Significant Relationships:  Mental Health Provider Lives with:  Self Do you feel safe going back to the place where you live?  No Need for family participation in patient care:  Yes (Comment)  Care giving concerns:  Patient lives alone in an apartment in [place].    Social Worker assessment / plan:  Holiday representative (CSW) received consult for SNF placement. CSW met with patient alone at bedside. Patient answered questions very slowly and could only answer basic questions. Patient reported that he lives alone in apartment and needs to go to a nursing home. Patient reported that he has been trying to get into [name]. CSW explained that patient would have to be placed at a SNF under medicaid and be willing to stay for 30 days and sign over his social security check to [name]. Patient verbalized his understanding and stated that he would be willing to do that. Patient gave CSW permission to call his RHA case manager [name]. CSW contacted [name] to get additional information. Per [name] he is patient's RHA Tourist information centre manager and stated that patient has a low IQ and a history of depression. Per [name] patient went  to rehab at [facility] in July of 2018 and was discharged home too soon. Per [name] patient has been sleeping and urinating in his wheel chair because he can't stand or walk. Per [name] patient has no family or friends and no HPOA or guardian. Per [name] they have been trying to get him placed to [name] however patient could no longer wait at home due to not getting the care he needs. FL2 complete and faxed out. PASARR is pending.   Per [facility] admissions coordinator at [name] they can accept patient once PASARR is received. MD aware of above. CSW will continue to follow and assist as needed.   Employment status:  Disabled (Comment on whether or not currently receiving Disability) Insurance information:  Medicare, Medicaid In [place] PT Recommendations:  Not assessed at this time Information / Referral to community resources:  [place]  Patient/Family's Response to care:  Patient is agreeable to going to [name].   Patient/Family's Understanding of and Emotional Response to Diagnosis, Current Treatment, and Prognosis:  Patient and RHA case manager [name] were very pleasant and thanked CSW for assistance.   Emotional Assessment Appearance:  Appears stated age Attitude/Demeanor/Rapport:    Affect (typically observed):  Accepting, Adaptable, Pleasant Orientation:  Oriented to Self, Oriented to Place, Oriented to  Time, Fluctuating Orientation (Suspected and/or reported Sundowners) Alcohol / Substance use:  Not Applicable Psych involvement (Current and /or in the community):  No (Comment)  Discharge Needs  Concerns to be addressed:  Discharge Planning Concerns Readmission within the last 30 days:  No Current discharge risk:  Dependent with Mobility Barriers to Discharge:  Continued Medical Work up   [employer], Veronia Beets, LCSW 04/12/2017, 4:41 PM

## 2017-04-13 ENCOUNTER — Inpatient Hospital Stay: Payer: Medicare Other | Admitting: Family Medicine

## 2017-04-13 DIAGNOSIS — R112 Nausea with vomiting, unspecified: Secondary | ICD-10-CM | POA: Diagnosis not present

## 2017-04-13 LAB — C DIFFICILE QUICK SCREEN W PCR REFLEX
C DIFFICILE (CDIFF) TOXIN: NEGATIVE
C Diff antigen: NEGATIVE
C Diff interpretation: NOT DETECTED

## 2017-04-13 LAB — URINALYSIS, COMPLETE (UACMP) WITH MICROSCOPIC
BILIRUBIN URINE: NEGATIVE
GLUCOSE, UA: NEGATIVE mg/dL
KETONES UR: 20 mg/dL — AB
NITRITE: NEGATIVE
PH: 5 (ref 5.0–8.0)
Protein, ur: 30 mg/dL — AB
SPECIFIC GRAVITY, URINE: 1.024 (ref 1.005–1.030)

## 2017-04-13 LAB — GLUCOSE, CAPILLARY: GLUCOSE-CAPILLARY: 99 mg/dL (ref 65–99)

## 2017-04-13 MED ORDER — FUROSEMIDE 40 MG PO TABS
20.0000 mg | ORAL_TABLET | Freq: Every day | ORAL | Status: DC
  Administered 2017-04-13 – 2017-04-14 (×2): 20 mg via ORAL
  Filled 2017-04-13 (×2): qty 1

## 2017-04-13 MED ORDER — PAROXETINE HCL 20 MG PO TABS
30.0000 mg | ORAL_TABLET | Freq: Every day | ORAL | Status: DC
  Administered 2017-04-13 – 2017-04-14 (×2): 30 mg via ORAL
  Filled 2017-04-13 (×2): qty 2

## 2017-04-13 MED ORDER — TRAZODONE HCL 50 MG PO TABS
50.0000 mg | ORAL_TABLET | Freq: Every day | ORAL | Status: DC
  Administered 2017-04-13: 50 mg via ORAL
  Filled 2017-04-13: qty 1

## 2017-04-13 MED ORDER — ATORVASTATIN CALCIUM 20 MG PO TABS
20.0000 mg | ORAL_TABLET | Freq: Every day | ORAL | Status: DC
  Administered 2017-04-13 – 2017-04-14 (×2): 20 mg via ORAL
  Filled 2017-04-13 (×2): qty 1

## 2017-04-13 MED ORDER — ACETAMINOPHEN 325 MG PO TABS
650.0000 mg | ORAL_TABLET | Freq: Once | ORAL | Status: AC
  Administered 2017-04-13: 650 mg via ORAL
  Filled 2017-04-13: qty 2

## 2017-04-13 MED ORDER — LEVOTHYROXINE SODIUM 88 MCG PO TABS
176.0000 ug | ORAL_TABLET | Freq: Every day | ORAL | Status: DC
  Administered 2017-04-14: 176 ug via ORAL
  Filled 2017-04-13: qty 2

## 2017-04-13 MED ORDER — DOCUSATE SODIUM 100 MG PO CAPS: 100.0000 mg | ORAL_CAPSULE | Freq: Two times a day (BID) | ORAL | Status: DC | PRN

## 2017-04-13 MED ORDER — ACETAMINOPHEN 500 MG PO TABS
1000.0000 mg | ORAL_TABLET | Freq: Once | ORAL | Status: AC
  Administered 2017-04-13: 1000 mg via ORAL
  Filled 2017-04-13: qty 2

## 2017-04-13 MED ORDER — LISINOPRIL 10 MG PO TABS
10.0000 mg | ORAL_TABLET | Freq: Every day | ORAL | Status: DC
  Administered 2017-04-13 – 2017-04-14 (×2): 10 mg via ORAL
  Filled 2017-04-13 (×2): qty 1

## 2017-04-13 MED ORDER — ASPIRIN EC 81 MG PO TBEC
81.0000 mg | DELAYED_RELEASE_TABLET | Freq: Every day | ORAL | Status: DC
  Administered 2017-04-13 – 2017-04-14 (×2): 81 mg via ORAL
  Filled 2017-04-13 (×3): qty 1

## 2017-04-13 MED ORDER — HYDROXYZINE HCL 25 MG PO TABS
50.0000 mg | ORAL_TABLET | Freq: Once | ORAL | Status: AC
  Administered 2017-04-13: 50 mg via ORAL
  Filled 2017-04-13: qty 2

## 2017-04-13 MED ORDER — CIPROFLOXACIN HCL 500 MG PO TABS
500.0000 mg | ORAL_TABLET | Freq: Two times a day (BID) | ORAL | Status: DC
  Administered 2017-04-13 – 2017-04-14 (×3): 500 mg via ORAL
  Filled 2017-04-13 (×3): qty 1

## 2017-04-13 MED ORDER — METFORMIN HCL 500 MG PO TABS
500.0000 mg | ORAL_TABLET | Freq: Two times a day (BID) | ORAL | Status: DC
  Administered 2017-04-14: 500 mg via ORAL
  Filled 2017-04-13: qty 1

## 2017-04-13 NOTE — ED Notes (Signed)
Pt provided lunch tray.

## 2017-04-13 NOTE — ED Notes (Signed)
Patient is asking to go home. Patient states he has lifeline and feels fine to go home. EDP aware.

## 2017-04-13 NOTE — ED Notes (Signed)
Pt loudly saying he wants to go home. Discussed w/ pt reasons why he could not go at this time, including safety and wellness issues. Pt provided w/ warm blankets and repositioned. Pt reassured that he would be cleaned up if he were to be incontinent.

## 2017-04-13 NOTE — ED Provider Notes (Signed)
Patient resting comfortably. AVSS. No issues. Awaiting placement   Jene Every, MD 04/13/17 1024

## 2017-04-13 NOTE — Progress Notes (Signed)
PASARR is pending. Clinical Child psychotherapist (CSW) sent in requested clinicals to [facility] today. Once PASARR is received patient can D/C to [facility]. Doug admissions coordinator at [facility] is aware of above. Patient's RHA case manager Iantha Fallen came to visit patient today and was made aware of above.  [employer], LCSW [phone]

## 2017-04-13 NOTE — ED Notes (Signed)
Pt had a BM.  This RN and Gerilyn Pilgrim, Tech cleaned him up and changed his sheets.  We put a brief on pt.  Pt is comfortable and resting at this time.

## 2017-04-13 NOTE — ED Notes (Signed)
Went to give patient medications, patient is resting, eyes closed, even unlabored respirations. Rise and fall of chest noted.

## 2017-04-13 NOTE — ED Provider Notes (Signed)
C. difficile is negative. The patient remains stable for social work evaluation.   Merrily Brittle, MD 04/13/17 [phone]

## 2017-04-14 ENCOUNTER — Inpatient Hospital Stay: Payer: Medicare Other | Admitting: Family Medicine

## 2017-04-14 DIAGNOSIS — R112 Nausea with vomiting, unspecified: Secondary | ICD-10-CM | POA: Diagnosis not present

## 2017-04-14 MED ORDER — OXYCODONE-ACETAMINOPHEN 5-325 MG PO TABS
1.0000 | ORAL_TABLET | Freq: Once | ORAL | Status: AC
  Administered 2017-04-14: 1 via ORAL
  Filled 2017-04-14: qty 1

## 2017-04-14 MED ORDER — ACETAMINOPHEN 325 MG PO TABS
ORAL_TABLET | ORAL | Status: AC
  Filled 2017-04-14: qty 2

## 2017-04-14 MED ORDER — ACETAMINOPHEN 325 MG PO TABS
650.0000 mg | ORAL_TABLET | Freq: Once | ORAL | Status: AC
  Administered 2017-04-14: 650 mg via ORAL

## 2017-04-14 MED ORDER — OXYCODONE-ACETAMINOPHEN 5-325 MG PO TABS: 1.0000 | ORAL_TABLET | Freq: Four times a day (QID) | ORAL | 0 refills | Status: DC | PRN

## 2017-04-14 MED ORDER — ACETAMINOPHEN 325 MG PO TABS
650.0000 mg | ORAL_TABLET | Freq: Once | ORAL | Status: AC
  Administered 2017-04-14: 650 mg via ORAL
  Filled 2017-04-14: qty 2

## 2017-04-14 NOTE — Clinical Social Work Placement (Signed)
   CLINICAL SOCIAL WORK PLACEMENT  NOTE  Date:  04/14/2017  Patient Details  Name: Clayton Grebernest J Panos Jr. MRN: [id number] Date of Birth: 09/18/1950  Clinical Social Work is seeking post-discharge placement for this patient at the Skilled  Nursing Facility level of care (*CSW will initial, date and re-position this form in  chart as items are completed):  Yes   Patient/family provided with [facility] Clinical Social Work Department's list of facilities offering this level of care within the geographic area requested by the patient (or if unable, by the patient's family).  Yes   Patient/family informed of their freedom to choose among providers that offer the needed level of care, that participate in Medicare, Medicaid or managed care program needed by the patient, have an available bed and are willing to accept the patient.  Yes   Patient/family informed of [facility]'s ownership interest in [facility] and [facility], as well as of the fact that they are under no obligation to receive care at these facilities.  PASRR submitted to EDS on 04/12/17     PASRR number received on 04/14/17     Existing PASRR number confirmed on       FL2 transmitted to all facilities in geographic area requested by pt/family on 04/12/17     FL2 transmitted to all facilities within larger geographic area on       Patient informed that his/her managed care company has contracts with or will negotiate with certain facilities, including the following:        Yes   Patient/family informed of bed offers received.  Patient chooses bed at  [facility] )     Physician recommends and patient chooses bed at      Patient to be transferred to  [facility] ) on 04/14/17.  Patient to be transferred to facility by  [facility] )     Patient family notified on 04/14/17 of transfer.  Name of family member notified:   (Patient's RHA case manager Fritzi MandesQuentin is aware of D/C today. )      PHYSICIAN       Additional Comment:    _______________________________________________ Melessia Kaus, Darleen CrockerBailey M, LCSW 04/14/2017, 10:53 AM

## 2017-04-14 NOTE — Progress Notes (Signed)
PASARR has been received, [id number] E expires on 05/13/2017. Patient is medically stable for D/C to [facility] today. [facility] patient can come today under medicaid to room 83. RN will call report and arrange EMS for transport. Clinical Child psychotherapistocial Worker (CSW) sent FL2 to [facility] via [facility]. Patient is aware of above and in agreement with the plan. CSW contacted patient's RHA case manager Fritzi MandesQuentin and made him aware of above. Please reconsult if future social work needs arise. CSW signing off.   [employer]iley Ashleah Valtierra, LCSW [phone]

## 2017-04-20 ENCOUNTER — Inpatient Hospital Stay: Payer: Medicare Other | Admitting: Family Medicine

## 2017-05-08 ENCOUNTER — Emergency Department
Admission: EM | Admit: 2017-05-08 | Discharge: 2017-05-08 | Disposition: A | Discharge status: Home / Self Care | Payer: Medicare Other | Attending: Emergency Medicine | Admitting: Emergency Medicine

## 2017-05-08 ENCOUNTER — Emergency Department: Payer: Medicare Other

## 2017-05-08 DIAGNOSIS — I252 Old myocardial infarction: Secondary | ICD-10-CM | POA: Diagnosis not present

## 2017-05-08 DIAGNOSIS — E039 Hypothyroidism, unspecified: Secondary | ICD-10-CM | POA: Insufficient documentation

## 2017-05-08 DIAGNOSIS — E119 Type 2 diabetes mellitus without complications: Secondary | ICD-10-CM | POA: Insufficient documentation

## 2017-05-08 DIAGNOSIS — Z7982 Long term (current) use of aspirin: Secondary | ICD-10-CM | POA: Diagnosis not present

## 2017-05-08 DIAGNOSIS — Z23 Encounter for immunization: Secondary | ICD-10-CM | POA: Insufficient documentation

## 2017-05-08 DIAGNOSIS — S0181XA Laceration without foreign body of other part of head, initial encounter: Secondary | ICD-10-CM | POA: Insufficient documentation

## 2017-05-08 DIAGNOSIS — W19XXXA Unspecified fall, initial encounter: Secondary | ICD-10-CM

## 2017-05-08 DIAGNOSIS — Z7984 Long term (current) use of oral hypoglycemic drugs: Secondary | ICD-10-CM | POA: Insufficient documentation

## 2017-05-08 DIAGNOSIS — Y9289 Other specified places as the place of occurrence of the external cause: Secondary | ICD-10-CM | POA: Insufficient documentation

## 2017-05-08 DIAGNOSIS — I10 Essential (primary) hypertension: Secondary | ICD-10-CM | POA: Insufficient documentation

## 2017-05-08 DIAGNOSIS — R262 Difficulty in walking, not elsewhere classified: Secondary | ICD-10-CM | POA: Insufficient documentation

## 2017-05-08 DIAGNOSIS — Z79899 Other long term (current) drug therapy: Secondary | ICD-10-CM | POA: Diagnosis not present

## 2017-05-08 DIAGNOSIS — W01198A Fall on same level from slipping, tripping and stumbling with subsequent striking against other object, initial encounter: Secondary | ICD-10-CM | POA: Insufficient documentation

## 2017-05-08 DIAGNOSIS — Y9301 Activity, walking, marching and hiking: Secondary | ICD-10-CM | POA: Diagnosis not present

## 2017-05-08 DIAGNOSIS — M4802 Spinal stenosis, cervical region: Secondary | ICD-10-CM

## 2017-05-08 DIAGNOSIS — Y999 Unspecified external cause status: Secondary | ICD-10-CM | POA: Diagnosis not present

## 2017-05-08 MED ORDER — LIDOCAINE HCL (PF) 1 % IJ SOLN
5.0000 mL | Freq: Once | INTRAMUSCULAR | Status: AC
  Administered 2017-05-08: 5 mL via INTRADERMAL
  Filled 2017-05-08: qty 5

## 2017-05-08 MED ORDER — TETANUS-DIPHTH-ACELL PERTUSSIS 5-2.5-18.5 LF-MCG/0.5 IM SUSP
0.5000 mL | Freq: Once | INTRAMUSCULAR | Status: DC
  Filled 2017-05-08: qty 0.5

## 2017-05-08 NOTE — ED Triage Notes (Signed)
Per EMS pt comes from [facility] and fell during Physical Therapy.  Pt has a laceration to the forehead and right cheek is swollen.  Pt never lost consciousness.  Pt is A&Ox4

## 2017-05-08 NOTE — ED Notes (Signed)
This RN assisted patient with using the urinal.

## 2017-05-08 NOTE — ED Notes (Signed)
Report called to [facility] RN. Pt transported back to [facility] via [employer].

## 2017-05-08 NOTE — ED Notes (Signed)
This RN spoke with Taliyah from [facility] about picking patient up, she stated that she could attempt to get [facility] to pick patient up in wheelchair Zenaida Niece but was unsure and patient would get the bill. This RN explained that if patient was able to safely be transported in wheelchair [place] then he would receive a bill for EMS transport. Per Dorina Hoyer, "it is not [facility]'s responsibility to arrange for transport back to the nursing home if they leave EMS".

## 2017-05-08 NOTE — ED Provider Notes (Signed)
[facility] Emergency Department Provider Note  ____________________________________________  Time seen: Approximately 2:36 PM  I have reviewed the triage vital signs and the nursing notes.   HISTORY  Chief Complaint Fall    HPI Clayton Green. is a 66 y.o. male that presents to emergency department for evaluation after fall. He thinks he walked too fast and fell and hit his head on the ground. Patient was at physical therapy so that he can "walk better." He does not think he lost consciousness but says that everything "just happened so fast." He does not take any blood thinners. Patient states that he has had 2 tetanus shots in the last year. No headache, shortness of breath, chest pain, nausea, vomiting, abdominal pain.   Past Medical History:  Diagnosis Date  . Acquired hypothyroidism    a. s/p thyroidectomy.  . Arthritis   . Depression   . Diabetes mellitus without complication (HCC)   . GERD (gastroesophageal reflux disease)   . Hyperlipidemia   . Hypertension   . Vitamin D deficiency     Patient Active Problem List   Diagnosis Date Noted  . NSTEMI (non-ST elevated myocardial infarction) (HCC) 03/01/2017  . Hyperlipidemia associated with type 2 diabetes mellitus (HCC) 09/11/2016  . Chronic low back pain 09/11/2016  . Degenerative joint disease (DJD) of lumbar spine 09/11/2016  . GERD (gastroesophageal reflux disease) 08/12/2016  . Chronic venous insufficiency 08/05/2016  . Elevated serum creatinine 08/05/2016  . Biliary colic 06/21/2016  . Cholelithiasis 06/21/2016  . 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 Vitamin D deficiency 03/28/2015  . 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 Essential hypertension 03/28/2015    Past Surgical History:  Procedure Laterality Date  . colonscopy    . THYROIDECTOMY, PARTIAL      Prior to  Admission medications   Medication Sig Start Date End Date Taking? Authorizing Provider  acetaminophen (TYLENOL) 500 MG tablet Take 500 mg by mouth 2 (two) times daily as needed.    [provider]  aspirin 81 MG tablet Take 1 tablet (81 mg total) by mouth daily. 09/21/16   Karamalegos, Netta Neat, DO  atorvastatin (LIPITOR) 20 MG tablet Take 1 tablet (20 mg total) by mouth daily. 09/11/16   Karamalegos, Netta Neat, DO  cephALEXin (KEFLEX) 500 MG capsule Take 1 capsule (500 mg total) by mouth 3 (three) times daily. For 7 days 04/01/17   Smitty Cords, DO  Cholecalciferol 2000 units CAPS Take 1 capsule (2,000 Units total) by mouth daily. Patient not taking: Reported on 04/08/2017 08/24/16   Smitty Cords, DO  docusate sodium (COLACE) 100 MG capsule Take 1 capsule (100 mg total) by mouth 2 (two) times daily as needed for mild constipation. Patient not taking: Reported on 04/08/2017 03/04/17   Auburn Bilberry, MD  furosemide (LASIX) 20 MG tablet Take 1 tablet (20 mg total) by mouth daily. 04/08/17   Karamalegos, Netta Neat, DO  levothyroxine (SYNTHROID, LEVOTHROID) 88 MCG tablet Take 2 tablets (176 mcg total) by mouth daily before breakfast. 09/11/16   Karamalegos, Netta Neat, DO  lisinopril (PRINIVIL,ZESTRIL) 10 MG tablet Take 1 tablet (10 mg total) by mouth daily. 08/12/16   Karamalegos, Netta Neat, DO  meloxicam (MOBIC) 15 MG tablet Take 15 mg by mouth daily.    [provider]  metFORMIN (GLUCOPHAGE) 500 MG tablet Take 1 tablet (500 mg total) by mouth 2 (two) times  daily with a meal. 08/12/16   Karamalegos, Netta Neat, DO  mineral oil-hydrophilic petrolatum (AQUAPHOR) ointment Apply topically as needed for dry skin. Apply to legs daily. 05/14/15   Janeann Forehand., MD  oxyCODONE-acetaminophen (ROXICET) 5-325 MG tablet Take 1 tablet by mouth every 6 (six) hours as needed. 04/14/17   Minna Antis, MD  PARoxetine (PAXIL) 30 MG tablet Take 1 tablet (30 mg  total) by mouth daily. 08/12/16   Karamalegos, Netta Neat, DO  ranitidine (ZANTAC) 150 MG tablet Take 1 tablet (150 mg total) by mouth at bedtime. 08/12/16   Karamalegos, Netta Neat, DO  traZODone (DESYREL) 50 MG tablet Take 50 mg by mouth at bedtime.  03/07/15   [provider]    Allergies Shellfish allergy  Family History  Problem Relation Age of Onset  . Diabetes Mother   . Cancer Father     Social History Social History  Substance Use Topics  . Smoking status: Never Smoker  . Smokeless tobacco: Never Used  . Alcohol use No     Review of Systems  Constitutional: No fever/chills Cardiovascular: No chest pain. Respiratory: No SOB. Gastrointestinal: No abdominal pain.  No nausea, no vomiting.  Musculoskeletal: Negative for musculoskeletal pain. Neurological: Negative for headaches   ____________________________________________   PHYSICAL EXAM:  VITAL SIGNS: ED Triage Vitals  Enc Vitals Group     BP 05/08/17 1354 (!) 122/53     Pulse Rate 05/08/17 1354 85     Resp 05/08/17 1354 16     Temp 05/08/17 1354 98.2 F (36.8 C)     Temp Source 05/08/17 1354 Oral     SpO2 05/08/17 1354 97 %     Weight 05/08/17 1355 240 lb (108.9 kg)     Height 05/08/17 1355  (1.702 m)     Head Circumference --      Peak Flow --      Pain Score 05/08/17 1353 10     Pain Loc --      Pain Edu? --      Excl. in GC? --      Constitutional: Alert and oriented. Well appearing and in no acute distress. Eyes: Conjunctivae are normal. PERRL. EOMI. Head: 1 cm jagged laceration with shaved skin to forehead. ENT:      Ears:      Nose: No congestion/rhinnorhea.      Mouth/Throat: Mucous membranes are moist.  Neck: No stridor.  Cardiovascular: Normal rate, regular rhythm.  Good peripheral circulation. Respiratory: Normal respiratory effort without tachypnea or retractions. Lungs CTAB. Good air entry to the bases with no decreased or absent breath sounds. Gastrointestinal:  Bowel sounds 4 quadrants. Soft and nontender to palpation. No guarding or rigidity. No palpable masses. No distention.  Musculoskeletal: Full range of motion to all extremities. No gross deformities appreciated. Neurologic:  Normal speech and language. No gross focal neurologic deficits are appreciated.  Skin:  Skin is warm, dry.   ____________________________________________   LABS (all labs ordered are listed, but only abnormal results are displayed)  Labs Reviewed - No data to display ____________________________________________  EKG   ____________________________________________  RADIOLOGY Lexine Baton, personally viewed and evaluated these images (plain radiographs) as part of my medical decision making, as well as reviewing the written report by the radiologist.  Ct Head Wo Contrast  Result Date: 05/08/2017 CLINICAL DATA:  Headache post blunt trauma. EXAM: CT HEAD WITHOUT CONTRAST CT MAXILLOFACIAL WITHOUT CONTRAST TECHNIQUE: Multidetector CT imaging of the head and maxillofacial  structures were performed using the standard protocol without intravenous contrast. Multiplanar CT image reconstructions of the maxillofacial structures were also generated. COMPARISON:  None. FINDINGS: CT HEAD FINDINGS Brain: Ventricles, cisterns and other CSF spaces are within normal. Evidence of an old left posterior parietal/ occipital watershed infarct. No mass, mass effect, shift of midline structures or acute hemorrhage. No evidence of acute infarction. Old lacune infarct versus prominent CSF space over the right cerebellum. Vascular: No hyperdense vessel or unexpected calcification. Skull: Right frontal scalp contusion. No evidence of skull fracture. Other: Subcutaneous hematoma measuring 0.9 x 2.7 cm over the right mid face. CT MAXILLOFACIAL FINDINGS Osseous: No acute facial bone fracture. Orbits: Within normal. Sinuses: Normal. Soft tissues: Minimal soft tissue swelling over the right frontal  scalp. Subcutaneous hematoma over the lateral right mid face and infraorbital region measuring 0.8 x 2.4 cm with adjacent subcutaneous edema. IMPRESSION: No acute intracranial findings. Small right frontal scalp contusion.  No fracture. 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 No acute facial bone fracture. Subcutaneous edema with 0.8 x 2.4 cm hematoma over the lateral right infraorbital and mid face region. Electronically Signed   By: Elberta Fortis M.D.   On: 05/08/2017 14:50   Ct Cervical Spine Wo Contrast  Result Date: 05/08/2017 CLINICAL DATA:  Status post fall.  Bilateral arm pain and numbness. EXAM: CT CERVICAL SPINE WITHOUT CONTRAST TECHNIQUE: Multidetector CT imaging of the cervical spine was performed without intravenous contrast. Multiplanar CT image reconstructions were also generated. COMPARISON:  None. FINDINGS: ALIGNMENT: Straightened lordosis. Vertebral bodies in alignment. SKULL BASE AND VERTEBRAE: Cervical vertebral bodies and posterior elements are intact. Moderate to severe C3-4 thru C5-6 disc height loss with endplate sclerosis and marginal spurring compatible with degenerative discs. C1-2 articulation maintained with moderate arthropathy. Calcified dentate ligament. SOFT TISSUES AND SPINAL CANAL: Nonacute. Nuchal ligament calcification. Status post thyroidectomy. Mild calcific atherosclerosis carotid bifurcations. DISC LEVELS: Disc osteophyte complexes result in moderate canal stenosis C4-5, severe canal stenosis C5-6. Multilevel moderate neural foraminal narrowing. UPPER CHEST: Lung apices are clear. OTHER: None. IMPRESSION: 1. No acute fracture or malalignment. 2. Severe canal stenosis C5-6, moderate at C4-5. Electronically Signed   By: Awilda Metro M.D.   On: 05/08/2017 16:39   Ct Maxillofacial Wo Contrast  Result Date: 05/08/2017 CLINICAL DATA:  Headache post blunt trauma. EXAM: CT HEAD WITHOUT CONTRAST CT MAXILLOFACIAL WITHOUT CONTRAST TECHNIQUE: Multidetector CT imaging of the  head and maxillofacial structures were performed using the standard protocol without intravenous contrast. Multiplanar CT image reconstructions of the maxillofacial structures were also generated. COMPARISON:  None. FINDINGS: CT HEAD FINDINGS Brain: Ventricles, cisterns and other CSF spaces are within normal. Evidence of an old left posterior parietal/ occipital watershed infarct. No mass, mass effect, shift of midline structures or acute hemorrhage. No evidence of acute infarction. Old lacune infarct versus prominent CSF space over the right cerebellum. Vascular: No hyperdense vessel or unexpected calcification. Skull: Right frontal scalp contusion. No evidence of skull fracture. Other: Subcutaneous hematoma measuring 0.9 x 2.7 cm over the right mid face. CT MAXILLOFACIAL FINDINGS Osseous: No acute facial bone fracture. Orbits: Within normal. Sinuses: Normal. Soft tissues: Minimal soft tissue swelling over the right frontal scalp. Subcutaneous hematoma over the lateral right mid face and infraorbital region measuring 0.8 x 2.4 cm with adjacent subcutaneous edema. IMPRESSION: No acute intracranial findings. Small right frontal scalp contusion.  No fracture. 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 No acute facial bone fracture. Subcutaneous edema with 0.8 x 2.4 cm hematoma over the lateral right  infraorbital and mid face region. Electronically Signed   By: Elberta Fortis M.D.   On: 05/08/2017 14:50    ____________________________________________    PROCEDURES  Procedure(s) performed:    Procedures  LACERATION REPAIR Performed by: Enid Derry  Consent: Verbal consent obtained.  Consent given by: patient  Prepped and Draped in normal sterile fashion  Wound explored: No foreign bodies   Laceration Location: forehead  Laceration Length: 1cm  Anesthesia: None  Local anesthetic: lidocaine 1% without epinephrine  Anesthetic total: 2 ml  Irrigation method: syringe  Amount of cleaning:  normal saline  Skin closure: 5-0 nylon  Number of sutures: 4  Technique: Simple interrupted  Patient tolerance: Patient tolerated the procedure well with no immediate complications.  Medications  Tdap (BOOSTRIX) injection 0.5 mL (0.5 mLs Intramuscular Not Given 05/08/17 1616)  lidocaine (PF) (XYLOCAINE) 1 % injection 5 mL (5 mLs Intradermal Given by Other 05/08/17 1545)     ____________________________________________   INITIAL IMPRESSION / ASSESSMENT AND PLAN / ED COURSE  Pertinent labs & imaging results that were available during my care of the patient were reviewed by me and considered in my medical decision making (see chart for details).  Review of the [place] CSRS was performed in accordance of the [facility] prior to dispensing any controlled drugs.  She presented to the emergency department for evaluation after fall. Vital signs and exam are reassuring. Head CT, neck CT, maxillofacial CT is negative for acute bony abnormalities. Laceration was repaired with stitches. Tetanus shot is up-to-date. Patient is to follow up with PCP as directed. Patient is given ED precautions to return to the ED for any worsening or new symptoms.     ____________________________________________  FINAL CLINICAL IMPRESSION(S) / ED DIAGNOSES  Final diagnoses:  Fall, initial encounter  Facial laceration, initial encounter  Cervical stenosis of spine      NEW MEDICATIONS STARTED DURING THIS VISIT:  Discharge Medication List as of 05/08/2017  5:01 PM          This chart was dictated using voice recognition software/Dragon. Despite best efforts to proofread, errors can occur which can change the meaning. Any change was purely unintentional.    Enid Derry, PA-C 05/08/17 1903    Governor Rooks, MD 05/09/17 [phone]

## 2017-05-20 ENCOUNTER — Encounter: Payer: Self-pay | Admitting: Family Medicine

## 2017-05-20 ENCOUNTER — Ambulatory Visit (INDEPENDENT_AMBULATORY_CARE_PROVIDER_SITE_OTHER): Payer: Medicare Other | Admitting: Family Medicine

## 2017-05-20 VITALS — BP 112/64 | HR 79 | Temp 97.7°F | Resp 16

## 2017-05-20 DIAGNOSIS — N183 Chronic kidney disease, stage 3 unspecified: Secondary | ICD-10-CM

## 2017-05-20 DIAGNOSIS — K219 Gastro-esophageal reflux disease without esophagitis: Secondary | ICD-10-CM

## 2017-05-20 DIAGNOSIS — G4700 Insomnia, unspecified: Secondary | ICD-10-CM

## 2017-05-20 DIAGNOSIS — E1142 Type 2 diabetes mellitus with diabetic polyneuropathy: Secondary | ICD-10-CM | POA: Diagnosis not present

## 2017-05-20 DIAGNOSIS — I872 Venous insufficiency (chronic) (peripheral): Secondary | ICD-10-CM

## 2017-05-20 DIAGNOSIS — I10 Essential (primary) hypertension: Secondary | ICD-10-CM

## 2017-05-20 DIAGNOSIS — D649 Anemia, unspecified: Secondary | ICD-10-CM | POA: Diagnosis not present

## 2017-05-20 DIAGNOSIS — M4802 Spinal stenosis, cervical region: Secondary | ICD-10-CM | POA: Diagnosis not present

## 2017-05-20 DIAGNOSIS — E1169 Type 2 diabetes mellitus with other specified complication: Secondary | ICD-10-CM

## 2017-05-20 DIAGNOSIS — F329 Major depressive disorder, single episode, unspecified: Secondary | ICD-10-CM

## 2017-05-20 DIAGNOSIS — E785 Hyperlipidemia, unspecified: Secondary | ICD-10-CM | POA: Diagnosis not present

## 2017-05-20 DIAGNOSIS — S0181XA Laceration without foreign body of other part of head, initial encounter: Secondary | ICD-10-CM

## 2017-05-20 DIAGNOSIS — F32A Depression, unspecified: Secondary | ICD-10-CM

## 2017-05-20 MED ORDER — FUROSEMIDE 20 MG PO TABS: 20.0000 mg | ORAL_TABLET | ORAL | 5 refills | Status: AC

## 2017-05-20 MED ORDER — ONDANSETRON 4 MG PO TBDP: 4.0000 mg | ORAL_TABLET | Freq: Three times a day (TID) | ORAL | 3 refills | Status: AC | PRN

## 2017-05-20 MED ORDER — TRAZODONE HCL 100 MG PO TABS: 100.0000 mg | ORAL_TABLET | Freq: Every day | ORAL | 11 refills | Status: AC

## 2017-05-20 MED ORDER — LISINOPRIL 10 MG PO TABS: 10.0000 mg | ORAL_TABLET | Freq: Every day | ORAL | 11 refills | Status: AC

## 2017-05-20 MED ORDER — RANITIDINE HCL 150 MG PO TABS: 150.0000 mg | ORAL_TABLET | Freq: Every day | ORAL | 11 refills | Status: AC

## 2017-05-20 MED ORDER — METFORMIN HCL 500 MG PO TABS: 500.0000 mg | ORAL_TABLET | Freq: Two times a day (BID) | ORAL | 11 refills | Status: AC

## 2017-05-20 MED ORDER — ATORVASTATIN CALCIUM 20 MG PO TABS: 20.0000 mg | ORAL_TABLET | Freq: Every day | ORAL | 11 refills | Status: AC

## 2017-05-20 MED ORDER — PAROXETINE HCL 30 MG PO TABS: 30.0000 mg | ORAL_TABLET | Freq: Every day | ORAL | 11 refills | Status: AC

## 2017-05-20 MED ORDER — MELOXICAM 15 MG PO TABS: 15.0000 mg | ORAL_TABLET | Freq: Every day | ORAL | 3 refills | Status: AC | PRN

## 2017-05-20 NOTE — Assessment & Plan Note (Signed)
Stable chronic problem Will monitor Creatinine Cautious with lasix Continue current regimen. Improve hydration as able

## 2017-05-20 NOTE — Assessment & Plan Note (Signed)
Stable, remains well controlled A1c previously Complicated by DM neuropathy, also with chronic venous insufficiency and poor circulation  Plan: 1. Continue Metformin  BID refilled 2. Continue Statin, ASA, ACEi 3. Follow-up 6 weeks for DM A1c

## 2017-05-20 NOTE — Patient Instructions (Addendum)
Thank you for coming to the clinic today.  1.  Please bring or submit a form for any Personal Care Services available, and we can work on completing this.  Again my recommendations are for you to have a higher level of care, such as Skilled Nursing or Long Term Care, or at least possibly back to Assisted Living Facility  Please contact [employer] Social Worker - Breda Enos [phone]  [place] [facility] - Social Worker - Alta Corning [phone]  ------- For pain, take Meloxicam  once daily as needed - if you are not having any pain then do not take this medicine. - it is safe to take with tylenol Do not take with advil, ibuprofen, aleve.  Recommend to start taking Tylenol Extra Strength  tabs - take 1 to 2 tabs per dose (max ) every 6-8 hours for pain (take regularly, don't skip a dose for next 7 days), max 24 hour daily dose is 6 tablets or . In the future you can repeat the same everyday Tylenol course for 1-2 weeks at a time.   ------- Ordered Zofran as needed for nausea  4 facial sutures removed   You have apt with GI Dr Wyline Mood at 10:30am on 05/26/17  Please schedule a Follow-up Appointment to: Return in about 6 weeks (around 07/01/2017) for Diabetes, N/V, Edema/Wounds.  If you have any other questions or concerns, please feel free to call the clinic or send a message through MyChart. You may also schedule an earlier appointment if necessary.  Additionally, you may be receiving a survey about your experience at our clinic within a few days to 1 week by e-mail or mail. We value your feedback.  Saralyn Pilar, DO [facility] [place], [place]

## 2017-05-20 NOTE — Assessment & Plan Note (Signed)
Controlled HTN currently Continue Lisinopril  - refilled

## 2017-05-20 NOTE — Progress Notes (Signed)
Subjective:    Patient ID: Clayton Green., male    DOB: [birth year], 66 y.o.   MRN: [id number]  Clayton Green. is a 66 y.o. male presenting on 05/20/2017 for Hospitalization Follow-up (fall)  History primarily provided by patient, additionally by his peer support from [employer], [place].  HPI  ED FOLLOW-UP VISIT  Hospital/Location: [facility] ED Date of ED Visit: 05/08/17  Reason for Visit: Fall and head injury Primary (+Secondary) Diagnosis: Facial laceration, fall  - ED Provider Note has been reviewed - Patient presents today 12 days after recent ED visit. Brief summary of recent course, patient fell forward while at home and injured head, he was attempted to transfer out of wheelchair when fell. In ED he had evaluation with multiple imaging studies including CT Head, Maxilofacial, C-Spine - did not show any acute fractures or other injuries. - Today reports overall has done well after discharge. Symptoms of facial pain and laceration have improved. - New medications on discharge: None.  ------------------------------------ Additional history obtained:  Generalized Weakness / Recurrent Falls / Chronic LE Edema (Venous insufficiency), Stasis Ulceration: - Reviewed recent course from SNF at [facility], he was admitted at end of August for approximately < 1 month for skilled rehab, also had recent nausea vomiting GI illness complicating his recovery. Reviewed faxed paperwork from SNF. Discussed patient case with [employer] staff and CSW [place], and DSS CSW [place]. - Patient was reportedly discharged from SNF after completed duration of stay and he was able to transfer on his own and was appropriate for discharge. However, conflicting report from [facility] assessment recently at his home that they were concerned he was not well enough for admission to [facility] services and would need higher level of acuity, difficulty with transfers and high risk overall. He had  a [place] DSS case open and they have been involved as well. - Today he reports that he is not interested in returning to SNF or ALF (previously resided at ALF) and he would like to remain at home as long as possible. He has support through [employer] with staff that take him to appointments and can help with medications, he is requesting home PCS service at this time, as he has limitations with home chores and meal prep. - Additional concern he still has nausea vomiting at times, and concern for incontinence if unable to transfer - He was also receiving wound care on lower extremity stasis wounds - He was on Oxycodone PRN at SNF, and asking about this now   Depression screen [facility] 2/9 05/20/2017 08/05/2016 03/28/2015  Decreased Interest 3 0 1  Down, Depressed, Hopeless 1 0 1  PHQ - 2 Score 4 0 2  Altered sleeping 1 - 0  Tired, decreased energy 1 - 0  Change in appetite 3 - 0  Feeling bad or failure about yourself  0 - 0  Trouble concentrating 0 - 0  Moving slowly or fidgety/restless 3 - 0  Suicidal thoughts 0 - 0  PHQ-9 Score 12 - 2  Difficult doing work/chores Somewhat difficult - Not difficult at all   I have reviewed the discharge medication list, and have reconciled the current and discharge medications today.   Current Outpatient Prescriptions:  .  acetaminophen (TYLENOL) 500 MG tablet, Take 500 mg by mouth 2 (two) times daily as needed., Disp: , Rfl:  .  aspirin 81 MG tablet, Take 1 tablet (81 mg total) by mouth daily., Disp: 30  tablet, Rfl: 11 .  atorvastatin (LIPITOR) 20 MG tablet, Take 1 tablet (20 mg total) by mouth daily., Disp: 30 tablet, Rfl: 11 .  Cholecalciferol 2000 units CAPS, Take 1 capsule (2,000 Units total) by mouth daily., Disp: 90 each, Rfl: 3 .  furosemide (LASIX) 20 MG tablet, Take 1 tablet (20 mg total) by mouth every other day. May take extra dose or daily as needed if worsening swelling., Disp: 30 tablet, Rfl: 5 .  levothyroxine (SYNTHROID, LEVOTHROID) 88 MCG  tablet, Take 2 tablets (176 mcg total) by mouth daily before breakfast., Disp: 180 tablet, Rfl: 3 .  lisinopril (PRINIVIL,ZESTRIL) 10 MG tablet, Take 1 tablet (10 mg total) by mouth daily., Disp: 30 tablet, Rfl: 11 .  meloxicam (MOBIC) 15 MG tablet, Take 1 tablet (15 mg total) by mouth daily as needed for pain., Disp: 30 tablet, Rfl: 3 .  metFORMIN (GLUCOPHAGE) 500 MG tablet, Take 1 tablet (500 mg total) by mouth 2 (two) times daily with a meal., Disp: 60 tablet, Rfl: 11 .  mineral oil-hydrophilic petrolatum (AQUAPHOR) ointment, Apply topically as needed for dry skin. Apply to legs daily., Disp: 420 g, Rfl: 0 .  PARoxetine (PAXIL) 30 MG tablet, Take 1 tablet (30 mg total) by mouth daily., Disp: 30 tablet, Rfl: 11 .  ranitidine (ZANTAC) 150 MG tablet, Take 1 tablet (150 mg total) by mouth at bedtime., Disp: 30 tablet, Rfl: 11 .  ondansetron (ZOFRAN ODT) 4 MG disintegrating tablet, Take 1 tablet (4 mg total) by mouth every 8 (eight) hours as needed for nausea or vomiting., Disp: 30 tablet, Rfl: 3 .  traZODone (DESYREL) 100 MG tablet, Take 1 tablet (100 mg total) by mouth at bedtime., Disp: 30 tablet, Rfl: 11  ------------------------------------------------------------------------- Social History  Substance Use Topics  . Smoking status: Never Smoker  . Smokeless tobacco: Never Used  . Alcohol use No    Review of Systems Per HPI unless specifically indicated above     Objective:    BP 112/64   Pulse 79   Temp 97.7 F (36.5 C) (Oral)   Resp 16   Wt Readings from Last 3 Encounters:  05/08/17 240 lb (108.9 kg)  04/12/17 261 lb (118.4 kg)  04/09/17 261 lb (118.4 kg)    Physical Exam  Constitutional: He is oriented to person, place, and time. He appears well-developed and well-nourished. No distress.  Chronically ill 66 year old male, comfortable, cooperative, obese  HENT:  Head: Normocephalic.  Mouth/Throat: Oropharynx is clear and moist.  R side facial ecchymosis and mild soft  tissue swelling with hematoma, seems resolving.  Forehead laceration, well healed with x 4 simple interrupted sutures, see suture removal note, wound intact after removal  Eyes: Conjunctivae are normal. Right eye exhibits no discharge. Left eye exhibits no discharge.  Neck: Normal range of motion. Neck supple. No thyromegaly present.  Cardiovascular: Normal rate, regular rhythm, normal heart sounds and intact distal pulses.   No murmur heard. Pulmonary/Chest: Effort normal and breath sounds normal. No respiratory distress. He has no wheezes. He has no rales.  Abdominal: Soft. Bowel sounds are normal. He exhibits no distension. There is no tenderness.  Musculoskeletal: He exhibits edema (Improved but still present +1-2 pitting edema bilateral symmetrical below knees, wearing appropriately wrapped non stick pads with coban bilateral lower extremity ).  Generalized lower extremity weakness bilateral 4/5 lower extremity knee flex/ext, unable to stand on own. Did not demonstrate transfer out of wheelchair today.  Upper extremity intact strength  Lymphadenopathy:  He has no cervical adenopathy.  Neurological: He is alert and oriented to person, place, and time.  Skin: Skin is warm and dry. No rash noted. He is not diaphoretic. No erythema.  Psychiatric: He has a normal mood and affect. His behavior is normal.  Mostly well groomed, good eye contact, normal speech and thoughts  Nursing note and vitals reviewed. ________________________________________________________ PROCEDURE NOTE Date - 05/20/17 Suture Removal - Forehead - Location / Date Sutures were placed: [facility] ED, on 05/08/17 - # of Sutures: 4 (simple interrupted Discussed benefits and risks (including pain, bleeding, infection, wound separation). Verbal consent given by patient Medication: None Time Out taken Examination of the sutured laceration on forehead appears to be well healed. Area cleansed with alcohol wipes. Appropriate # of  sutures counted and confirmed. Removal of sutures one by one using sterile pick ups to adjust and sterile scissors to cut one side of suture away from knot. Removal was uncomplicated. - All sutures were successfully removed and wound remains intact  Results for orders placed or performed during the hospital encounter of 04/12/17  C difficile quick scan w PCR reflex  Result Value Ref Range   C Diff antigen NEGATIVE NEGATIVE   C Diff toxin NEGATIVE NEGATIVE   C Diff interpretation No C. difficile detected.   Lipase, blood  Result Value Ref Range   Lipase 25 11 - 51 U/L  Comprehensive metabolic panel  Result Value Ref Range   Sodium 139 135 - 145 mmol/L   Potassium 4.0 3.5 - 5.1 mmol/L   Chloride 106 101 - 111 mmol/L   CO2 23 22 - 32 mmol/L   Glucose, Bld 99 65 - 99 mg/dL   BUN 29 (H) 6 - 20 mg/dL   Creatinine, Ser 1.61 (H) 0.61 - 1.24 mg/dL   Calcium 8.9 8.9 - 09.6 mg/dL   Total Protein 7.2 6.5 - 8.1 g/dL   Albumin 3.3 (L) 3.5 - 5.0 g/dL   AST 28 15 - 41 U/L   ALT 20 17 - 63 U/L   Alkaline Phosphatase 227 (H) 38 - 126 U/L   Total Bilirubin 1.1 0.3 - 1.2 mg/dL   GFR calc non Af Amer 55 (L) >60 mL/min   GFR calc Af Amer >60 >60 mL/min   Anion gap 10 5 - 15  CBC  Result Value Ref Range   WBC 11.4 (H) 3.8 - 10.6 K/uL   RBC 4.34 (L) 4.40 - 5.90 MIL/uL   Hemoglobin 12.7 (L) 13.0 - 18.0 g/dL   HCT 04.5 (L) 40.9 - 81.1 %   MCV 90.0 80.0 - 100.0 fL   MCH 29.3 26.0 - 34.0 pg   MCHC 32.5 32.0 - 36.0 g/dL   RDW 91.4 (H) 78.2 - 95.6 %   Platelets 266 150 - 440 K/uL  Urinalysis, Complete w Microscopic  Result Value Ref Range   Color, Urine AMBER (A) YELLOW   APPearance CLOUDY (A) CLEAR   Specific Gravity, Urine 1.024 1.005 - 1.030   pH 5.0 5.0 - 8.0   Glucose, UA NEGATIVE NEGATIVE mg/dL   Hgb urine dipstick SMALL (A) NEGATIVE   Bilirubin Urine NEGATIVE NEGATIVE   Ketones, ur 20 (A) NEGATIVE mg/dL   Protein, ur 30 (A) NEGATIVE mg/dL   Nitrite NEGATIVE NEGATIVE   Leukocytes, UA  LARGE (A) NEGATIVE   RBC / HPF 6-30 0 - 5 RBC/hpf   WBC, UA TOO NUMEROUS TO COUNT 0 - 5 WBC/hpf   Bacteria, UA MANY (A) NONE SEEN  Squamous Epithelial / LPF 0-5 (A) NONE SEEN   Mucus PRESENT   Glucose, capillary  Result Value Ref Range   Glucose-Capillary 99 65 - 99 mg/dL      Assessment & Plan:   Problem List Items Addressed This Visit    Cervical stenosis of spine    Stable currently Improved after recent fall Chronic neck and MSK pain Discontinue Oxycodone due to high risk with falls Re-start Meloxicam  daily - caution with CKD, can use PRN if helping Increase regular Tylenol 500-1000mg  TID more regular dosing      Chronic venous insufficiency    Improved now, chronic problem limited due to prolonged seated wheelchair, limited ambulation. - Has compression and tries elevation - Continue Lasix  every other day for now, can increase PRN if need      Relevant Medications   atorvastatin (LIPITOR) 20 MG tablet   furosemide (LASIX) 20 MG tablet   lisinopril (PRINIVIL,ZESTRIL) 10 MG tablet   CKD (chronic kidney disease), stage III (HCC)    Stable chronic problem Will monitor Creatinine Cautious with lasix Continue current regimen. Improve hydration as able      Relevant Orders   BASIC METABOLIC PANEL WITH GFR   Essential hypertension    Controlled HTN currently Continue Lisinopril  - refilled      Relevant Medications   atorvastatin (LIPITOR) 20 MG tablet   furosemide (LASIX) 20 MG tablet   lisinopril (PRINIVIL,ZESTRIL) 10 MG tablet   GERD (gastroesophageal reflux disease)    Refilled Zantac      Relevant Medications   ranitidine (ZANTAC) 150 MG tablet   ondansetron (ZOFRAN ODT) 4 MG disintegrating tablet   Hyperlipidemia associated with type 2 diabetes mellitus (HCC)    Refilled Atorvastatin      Relevant Medications   atorvastatin (LIPITOR) 20 MG tablet   furosemide (LASIX) 20 MG tablet   lisinopril (PRINIVIL,ZESTRIL) 10 MG tablet    metFORMIN (GLUCOPHAGE) 500 MG tablet   Insomnia    Increased Trazodone from 50 to  nightly for better sleep      Relevant Medications   traZODone (DESYREL) 100 MG tablet   Moderate depressive disorder    Stable, on current Paxil No suicidal ideation today, recent ED visit with concern He has peer support and resources Increased Trazodone 50 to  nightly for better sleep      Relevant Medications   PARoxetine (PAXIL) 30 MG tablet   traZODone (DESYREL) 100 MG tablet   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 Continue Metformin  BID refilled 2. Continue Statin, ASA, ACEi 3. Follow-up 6 weeks for DM A1c      Relevant Medications   atorvastatin (LIPITOR) 20 MG tablet   lisinopril (PRINIVIL,ZESTRIL) 10 MG tablet   metFORMIN (GLUCOPHAGE) 500 MG tablet   Other Relevant Orders   Hemoglobin A1c    Other Visit Diagnoses    Anemia, unspecified type       Relevant Orders   CBC with Differential/Platelet   Facial laceration, initial encounter       Resolved, 4 sutures removed today, no complication     Ultimately, I am concerned that this patient may require higher level acuity of care in near future. He is adamantly against going back to SNF or ALF at this time, and his decision is  to stay at home if possible. He does have peer support services, and has been evaluated by various RN CSW recently. He seems to be capable of some basic ADLs, my concern is fall risk with transfers and general weakness. Also with limited access to pick up meds, meal prep. He will greatly benefit from Personal Care Services [facility]) as soon as possible. I would recommend max available to him, ideally up to 24 hour care if this is available, otherwise I think he would do reasonably well with less and  intermittent care basis. - Awaiting PCS form or any other requests to assist patient with services - Note he is no longer followed by [facility], due to high risk and not meeting criteria for home care  Additional assessment - He has AGI apt for evaluation next week 10/10 for nausea vomiting, concern with poor nutrition contributing to his weakness   Meds ordered this encounter  Medications  . atorvastatin (LIPITOR) 20 MG tablet    Sig: Take 1 tablet (20 mg total) by mouth daily.    Dispense:  30 tablet    Refill:  11    Please fill blister pack, and future requesting delivery service  . furosemide (LASIX) 20 MG tablet    Sig: Take 1 tablet (20 mg total) by mouth every other day. May take extra dose or daily as needed if worsening swelling.    Dispense:  30 tablet    Refill:  5    Please fill blister pack and deliver in future  . lisinopril (PRINIVIL,ZESTRIL) 10 MG tablet    Sig: Take 1 tablet (10 mg total) by mouth daily.    Dispense:  30 tablet    Refill:  11    Please fill blister pack and deliver in future  . meloxicam (MOBIC) 15 MG tablet    Sig: Take 1 tablet (15 mg total) by mouth daily as needed for pain.    Dispense:  30 tablet    Refill:  3    Please fill blister pack and deliver in future  . metFORMIN (GLUCOPHAGE) 500 MG tablet    Sig: Take 1 tablet (500 mg total) by mouth 2 (two) times daily with a meal.    Dispense:  60 tablet    Refill:  11  . PARoxetine (PAXIL) 30 MG tablet    Sig: Take 1 tablet (30 mg total) by mouth daily.    Dispense:  30 tablet    Refill:  11  . ranitidine (ZANTAC) 150 MG tablet    Sig: Take 1 tablet (150 mg total) by mouth at bedtime.    Dispense:  30 tablet    Refill:  11  . traZODone (DESYREL) 100 MG tablet    Sig: Take 1 tablet (100 mg total) by mouth at bedtime.    Dispense:  30 tablet    Refill:  11    Please fill blister pack and deliver in future  . ondansetron (ZOFRAN ODT) 4 MG disintegrating tablet    Sig: Take 1 tablet (4  mg total) by mouth every 8 (eight) hours as needed for nausea or vomiting.    Dispense:  30 tablet    Refill:  3    Follow up plan: Return in about 6 weeks (around 07/01/2017) for Diabetes, N/V, Edema/Wounds.  Saralyn Pilar, DO [facility] 05/20/2017, 6:21 PM

## 2017-05-20 NOTE — Assessment & Plan Note (Signed)
Refilled Atorvastatin.

## 2017-05-20 NOTE — Assessment & Plan Note (Signed)
Increased Trazodone from 50 to  nightly for better sleep

## 2017-05-20 NOTE — Assessment & Plan Note (Signed)
Stable, on current Paxil No suicidal ideation today, recent ED visit with concern He has peer support and resources Increased Trazodone 50 to  nightly for better sleep

## 2017-05-20 NOTE — Assessment & Plan Note (Addendum)
Refilled Zantac 

## 2017-05-20 NOTE — Assessment & Plan Note (Signed)
Stable currently Improved after recent fall Chronic neck and MSK pain Discontinue Oxycodone due to high risk with falls Re-start Meloxicam  daily - caution with CKD, can use PRN if helping Increase regular Tylenol 500-1000mg  TID more regular dosing

## 2017-05-20 NOTE — Assessment & Plan Note (Signed)
Improved now, chronic problem limited due to prolonged seated wheelchair, limited ambulation. - Has compression and tries elevation - Continue Lasix  every other day for now, can increase PRN if need

## 2017-05-21 LAB — CBC WITH DIFFERENTIAL/PLATELET
BASOS PCT: 0.4 %
Basophils Absolute: 45 cells/uL (ref 0–200)
EOS PCT: 0.9 %
Eosinophils Absolute: 101 cells/uL (ref 15–500)
HEMATOCRIT: 34.5 % — AB (ref 38.5–50.0)
HEMOGLOBIN: 11.6 g/dL — AB (ref 13.2–17.1)
LYMPHS ABS: 840 {cells}/uL — AB (ref 850–3900)
MCH: 30.1 pg (ref 27.0–33.0)
MCHC: 33.6 g/dL (ref 32.0–36.0)
MCV: 89.4 fL (ref 80.0–100.0)
MPV: 10.4 fL (ref 7.5–12.5)
Monocytes Relative: 6.8 %
NEUTROS ABS: 9453 {cells}/uL — AB (ref 1500–7800)
Neutrophils Relative %: 84.4 %
Platelets: 191 10*3/uL (ref 140–400)
RBC: 3.86 10*6/uL — AB (ref 4.20–5.80)
RDW: 16.5 % — ABNORMAL HIGH (ref 11.0–15.0)
Total Lymphocyte: 7.5 %
WBC mixed population: 762 cells/uL (ref 200–950)
WBC: 11.2 10*3/uL — AB (ref 3.8–10.8)

## 2017-05-21 LAB — HEMOGLOBIN A1C
EAG (MMOL/L): 6.2 (calc)
Hgb A1c MFr Bld: 5.5 % of total Hgb (ref <5.7)
Mean Plasma Glucose: 111 (calc)

## 2017-05-21 LAB — BASIC METABOLIC PANEL WITH GFR
BUN/Creatinine Ratio: 20 (calc) (ref 6–22)
BUN: 33 mg/dL — AB (ref 7–25)
CALCIUM: 8.4 mg/dL — AB (ref 8.6–10.3)
CHLORIDE: 98 mmol/L (ref 98–110)
CO2: 29 mmol/L (ref 20–32)
Creat: 1.69 mg/dL — ABNORMAL HIGH (ref 0.70–1.25)
GFR, EST AFRICAN AMERICAN: 48 mL/min/{1.73_m2} — AB (ref >=60)
GFR, Est Non African American: 41 mL/min/{1.73_m2} — ABNORMAL LOW (ref >=60)
GLUCOSE: 124 mg/dL — AB (ref 65–99)
Potassium: 3.5 mmol/L (ref 3.5–5.3)
Sodium: 137 mmol/L (ref 135–146)

## 2017-05-23 ENCOUNTER — Inpatient Hospital Stay (HOSPITAL_COMMUNITY)
Admission: EM | Admit: 2017-05-23 | Discharge: 2017-06-01 | DRG: 065 | Disposition: A | Discharge status: Skilled Nursing Facility | Payer: Medicare Other | Attending: Internal Medicine | Admitting: Internal Medicine

## 2017-05-23 ENCOUNTER — Emergency Department (HOSPITAL_COMMUNITY): Payer: Medicare Other

## 2017-05-23 ENCOUNTER — Encounter (HOSPITAL_COMMUNITY): Payer: Self-pay | Admitting: Radiology

## 2017-05-23 ENCOUNTER — Inpatient Hospital Stay (HOSPITAL_COMMUNITY): Payer: Medicare Other

## 2017-05-23 DIAGNOSIS — E1122 Type 2 diabetes mellitus with diabetic chronic kidney disease: Secondary | ICD-10-CM | POA: Diagnosis present

## 2017-05-23 DIAGNOSIS — I5032 Chronic diastolic (congestive) heart failure: Secondary | ICD-10-CM | POA: Diagnosis present

## 2017-05-23 DIAGNOSIS — G9389 Other specified disorders of brain: Secondary | ICD-10-CM | POA: Diagnosis present

## 2017-05-23 DIAGNOSIS — I872 Venous insufficiency (chronic) (peripheral): Secondary | ICD-10-CM | POA: Diagnosis not present

## 2017-05-23 DIAGNOSIS — B9620 Unspecified Escherichia coli [E. coli] as the cause of diseases classified elsewhere: Secondary | ICD-10-CM | POA: Diagnosis not present

## 2017-05-23 DIAGNOSIS — N3001 Acute cystitis with hematuria: Secondary | ICD-10-CM | POA: Diagnosis present

## 2017-05-23 DIAGNOSIS — E559 Vitamin D deficiency, unspecified: Secondary | ICD-10-CM | POA: Diagnosis present

## 2017-05-23 DIAGNOSIS — G8194 Hemiplegia, unspecified affecting left nondominant side: Secondary | ICD-10-CM | POA: Diagnosis present

## 2017-05-23 DIAGNOSIS — I63511 Cerebral infarction due to unspecified occlusion or stenosis of right middle cerebral artery: Secondary | ICD-10-CM | POA: Diagnosis present

## 2017-05-23 DIAGNOSIS — I83029 Varicose veins of left lower extremity with ulcer of unspecified site: Secondary | ICD-10-CM | POA: Diagnosis present

## 2017-05-23 DIAGNOSIS — X58XXXA Exposure to other specified factors, initial encounter: Secondary | ICD-10-CM | POA: Diagnosis present

## 2017-05-23 DIAGNOSIS — E890 Postprocedural hypothyroidism: Secondary | ICD-10-CM | POA: Diagnosis present

## 2017-05-23 DIAGNOSIS — F329 Major depressive disorder, single episode, unspecified: Secondary | ICD-10-CM | POA: Diagnosis present

## 2017-05-23 DIAGNOSIS — Z7982 Long term (current) use of aspirin: Secondary | ICD-10-CM

## 2017-05-23 DIAGNOSIS — G839 Paralytic syndrome, unspecified: Secondary | ICD-10-CM | POA: Diagnosis present

## 2017-05-23 DIAGNOSIS — R7989 Other specified abnormal findings of blood chemistry: Secondary | ICD-10-CM | POA: Diagnosis present

## 2017-05-23 DIAGNOSIS — E1159 Type 2 diabetes mellitus with other circulatory complications: Secondary | ICD-10-CM | POA: Diagnosis not present

## 2017-05-23 DIAGNOSIS — I130 Hypertensive heart and chronic kidney disease with heart failure and stage 1 through stage 4 chronic kidney disease, or unspecified chronic kidney disease: Secondary | ICD-10-CM | POA: Diagnosis present

## 2017-05-23 DIAGNOSIS — E1165 Type 2 diabetes mellitus with hyperglycemia: Secondary | ICD-10-CM | POA: Diagnosis present

## 2017-05-23 DIAGNOSIS — Z6836 Body mass index (BMI) 36.0-36.9, adult: Secondary | ICD-10-CM

## 2017-05-23 DIAGNOSIS — R609 Edema, unspecified: Secondary | ICD-10-CM | POA: Diagnosis not present

## 2017-05-23 DIAGNOSIS — Z9189 Other specified personal risk factors, not elsewhere classified: Secondary | ICD-10-CM | POA: Diagnosis not present

## 2017-05-23 DIAGNOSIS — Z1612 Extended spectrum beta lactamase (ESBL) resistance: Secondary | ICD-10-CM | POA: Diagnosis not present

## 2017-05-23 DIAGNOSIS — S0003XA Contusion of scalp, initial encounter: Secondary | ICD-10-CM | POA: Diagnosis present

## 2017-05-23 DIAGNOSIS — G629 Polyneuropathy, unspecified: Secondary | ICD-10-CM | POA: Diagnosis present

## 2017-05-23 DIAGNOSIS — Z79899 Other long term (current) drug therapy: Secondary | ICD-10-CM

## 2017-05-23 DIAGNOSIS — N390 Urinary tract infection, site not specified: Secondary | ICD-10-CM

## 2017-05-23 DIAGNOSIS — E1140 Type 2 diabetes mellitus with diabetic neuropathy, unspecified: Secondary | ICD-10-CM | POA: Diagnosis present

## 2017-05-23 DIAGNOSIS — L97919 Non-pressure chronic ulcer of unspecified part of right lower leg with unspecified severity: Secondary | ICD-10-CM | POA: Diagnosis present

## 2017-05-23 DIAGNOSIS — L97929 Non-pressure chronic ulcer of unspecified part of left lower leg with unspecified severity: Secondary | ICD-10-CM | POA: Diagnosis present

## 2017-05-23 DIAGNOSIS — I509 Heart failure, unspecified: Secondary | ICD-10-CM

## 2017-05-23 DIAGNOSIS — R4701 Aphasia: Secondary | ICD-10-CM | POA: Diagnosis present

## 2017-05-23 DIAGNOSIS — I10 Essential (primary) hypertension: Secondary | ICD-10-CM | POA: Diagnosis not present

## 2017-05-23 DIAGNOSIS — D72829 Elevated white blood cell count, unspecified: Secondary | ICD-10-CM | POA: Diagnosis not present

## 2017-05-23 DIAGNOSIS — N3000 Acute cystitis without hematuria: Secondary | ICD-10-CM | POA: Diagnosis not present

## 2017-05-23 DIAGNOSIS — E785 Hyperlipidemia, unspecified: Secondary | ICD-10-CM | POA: Diagnosis present

## 2017-05-23 DIAGNOSIS — N179 Acute kidney failure, unspecified: Secondary | ICD-10-CM | POA: Diagnosis present

## 2017-05-23 DIAGNOSIS — Z7984 Long term (current) use of oral hypoglycemic drugs: Secondary | ICD-10-CM | POA: Diagnosis not present

## 2017-05-23 DIAGNOSIS — I493 Ventricular premature depolarization: Secondary | ICD-10-CM | POA: Diagnosis present

## 2017-05-23 DIAGNOSIS — N183 Chronic kidney disease, stage 3 (moderate): Secondary | ICD-10-CM | POA: Diagnosis present

## 2017-05-23 DIAGNOSIS — I83019 Varicose veins of right lower extremity with ulcer of unspecified site: Secondary | ICD-10-CM | POA: Diagnosis present

## 2017-05-23 DIAGNOSIS — R471 Dysarthria and anarthria: Secondary | ICD-10-CM | POA: Diagnosis present

## 2017-05-23 DIAGNOSIS — K219 Gastro-esophageal reflux disease without esophagitis: Secondary | ICD-10-CM | POA: Diagnosis present

## 2017-05-23 DIAGNOSIS — I639 Cerebral infarction, unspecified: Secondary | ICD-10-CM

## 2017-05-23 DIAGNOSIS — R29810 Facial weakness: Secondary | ICD-10-CM | POA: Diagnosis present

## 2017-05-23 DIAGNOSIS — I63411 Cerebral infarction due to embolism of right middle cerebral artery: Secondary | ICD-10-CM | POA: Diagnosis not present

## 2017-05-23 DIAGNOSIS — I6340 Cerebral infarction due to embolism of unspecified cerebral artery: Secondary | ICD-10-CM | POA: Diagnosis not present

## 2017-05-23 DIAGNOSIS — B964 Proteus (mirabilis) (morganii) as the cause of diseases classified elsewhere: Secondary | ICD-10-CM | POA: Diagnosis not present

## 2017-05-23 DIAGNOSIS — Z91013 Allergy to seafood: Secondary | ICD-10-CM | POA: Diagnosis not present

## 2017-05-23 DIAGNOSIS — L8990 Pressure ulcer of unspecified site, unspecified stage: Secondary | ICD-10-CM | POA: Diagnosis present

## 2017-05-23 DIAGNOSIS — M1990 Unspecified osteoarthritis, unspecified site: Secondary | ICD-10-CM | POA: Diagnosis present

## 2017-05-23 DIAGNOSIS — M79609 Pain in unspecified limb: Secondary | ICD-10-CM | POA: Diagnosis not present

## 2017-05-23 DIAGNOSIS — R1310 Dysphagia, unspecified: Secondary | ICD-10-CM | POA: Diagnosis present

## 2017-05-23 DIAGNOSIS — I878 Other specified disorders of veins: Secondary | ICD-10-CM | POA: Diagnosis present

## 2017-05-23 DIAGNOSIS — R4781 Slurred speech: Secondary | ICD-10-CM | POA: Diagnosis present

## 2017-05-23 DIAGNOSIS — R29717 NIHSS score 17: Secondary | ICD-10-CM | POA: Diagnosis present

## 2017-05-23 DIAGNOSIS — E876 Hypokalemia: Secondary | ICD-10-CM | POA: Diagnosis present

## 2017-05-23 LAB — CBG MONITORING, ED: Glucose-Capillary: 106 mg/dL — ABNORMAL HIGH (ref 65–99)

## 2017-05-23 LAB — I-STAT CHEM 8, ED
BUN: 47 mg/dL — ABNORMAL HIGH (ref 6–20)
CALCIUM ION: 0.99 mmol/L — AB (ref 1.15–1.40)
CHLORIDE: 99 mmol/L — AB (ref 101–111)
Creatinine, Ser: 2 mg/dL — ABNORMAL HIGH (ref 0.61–1.24)
Glucose, Bld: 109 mg/dL — ABNORMAL HIGH (ref 65–99)
HEMATOCRIT: 38 % — AB (ref 39.0–52.0)
Hemoglobin: 12.9 g/dL — ABNORMAL LOW (ref 13.0–17.0)
POTASSIUM: 3.1 mmol/L — AB (ref 3.5–5.1)
SODIUM: 138 mmol/L (ref 135–145)
TCO2: 28 mmol/L (ref 22–32)

## 2017-05-23 LAB — URINALYSIS, ROUTINE W REFLEX MICROSCOPIC
Bacteria, UA: NONE SEEN
Bilirubin Urine: NEGATIVE
GLUCOSE, UA: NEGATIVE mg/dL
KETONES UR: 5 mg/dL — AB
Nitrite: NEGATIVE
PH: 5 (ref 5.0–8.0)
Protein, ur: 30 mg/dL — AB
SQUAMOUS EPITHELIAL / LPF: NONE SEEN
Specific Gravity, Urine: 1.041 — ABNORMAL HIGH (ref 1.005–1.030)

## 2017-05-23 LAB — CBC
HEMATOCRIT: 39.4 % (ref 39.0–52.0)
Hemoglobin: 12.4 g/dL — ABNORMAL LOW (ref 13.0–17.0)
MCH: 29.5 pg (ref 26.0–34.0)
MCHC: 31.5 g/dL (ref 30.0–36.0)
MCV: 93.8 fL (ref 78.0–100.0)
Platelets: 159 10*3/uL (ref 150–400)
RBC: 4.2 MIL/uL — ABNORMAL LOW (ref 4.22–5.81)
RDW: 17.8 % — AB (ref 11.5–15.5)
WBC: 11.7 10*3/uL — ABNORMAL HIGH (ref 4.0–10.5)

## 2017-05-23 LAB — I-STAT TROPONIN, ED: Troponin i, poc: 0.19 ng/mL (ref 0.00–0.08)

## 2017-05-23 LAB — DIFFERENTIAL
BASOS ABS: 0 10*3/uL (ref 0.0–0.1)
Basophils Relative: 0 %
EOS PCT: 1 %
Eosinophils Absolute: 0.1 10*3/uL (ref 0.0–0.7)
LYMPHS PCT: 10 %
Lymphs Abs: 1.2 10*3/uL (ref 0.7–4.0)
MONOS PCT: 11 %
Monocytes Absolute: 1.3 10*3/uL — ABNORMAL HIGH (ref 0.1–1.0)
NEUTROS PCT: 78 %
Neutro Abs: 9.1 10*3/uL — ABNORMAL HIGH (ref 1.7–7.7)

## 2017-05-23 LAB — COMPREHENSIVE METABOLIC PANEL
ALBUMIN: 3.3 g/dL — AB (ref 3.5–5.0)
ALK PHOS: 214 U/L — AB (ref 38–126)
ALT: 19 U/L (ref 17–63)
AST: 39 U/L (ref 15–41)
Anion gap: 14 (ref 5–15)
BUN: 49 mg/dL — AB (ref 6–20)
CALCIUM: 8.7 mg/dL — AB (ref 8.9–10.3)
CO2: 24 mmol/L (ref 22–32)
CREATININE: 2.09 mg/dL — AB (ref 0.61–1.24)
Chloride: 99 mmol/L — ABNORMAL LOW (ref 101–111)
GFR calc Af Amer: 36 mL/min — ABNORMAL LOW (ref >60)
GFR calc non Af Amer: 31 mL/min — ABNORMAL LOW (ref >60)
GLUCOSE: 134 mg/dL — AB (ref 65–99)
Potassium: 3.6 mmol/L (ref 3.5–5.1)
SODIUM: 137 mmol/L (ref 135–145)
Total Bilirubin: 1 mg/dL (ref 0.3–1.2)
Total Protein: 7.3 g/dL (ref 6.5–8.1)

## 2017-05-23 LAB — TROPONIN I
TROPONIN I: 0.21 ng/mL — AB (ref <0.03)
Troponin I: 0.2 ng/mL (ref <0.03)
Troponin I: 0.25 ng/mL (ref <0.03)

## 2017-05-23 LAB — GLUCOSE, CAPILLARY
GLUCOSE-CAPILLARY: 120 mg/dL — AB (ref 65–99)
Glucose-Capillary: 125 mg/dL — ABNORMAL HIGH (ref 65–99)

## 2017-05-23 LAB — BRAIN NATRIURETIC PEPTIDE: B NATRIURETIC PEPTIDE 5: 54.6 pg/mL (ref 0.0–100.0)

## 2017-05-23 LAB — APTT: APTT: 31 s (ref 24–36)

## 2017-05-23 LAB — PROTIME-INR
INR: 1.15
PROTHROMBIN TIME: 14.6 s (ref 11.4–15.2)

## 2017-05-23 LAB — MRSA PCR SCREENING: MRSA by PCR: NEGATIVE

## 2017-05-23 MED ORDER — LEVOTHYROXINE SODIUM 100 MCG IV SOLR
88.0000 ug | Freq: Every day | INTRAVENOUS | Status: DC
  Administered 2017-05-24: 88 ug via INTRAVENOUS
  Filled 2017-05-23 (×2): qty 5

## 2017-05-23 MED ORDER — SENNOSIDES-DOCUSATE SODIUM 8.6-50 MG PO TABS
1.0000 | ORAL_TABLET | Freq: Every evening | ORAL | Status: DC | PRN
  Administered 2017-05-29: 1 via ORAL
  Filled 2017-05-23: qty 1

## 2017-05-23 MED ORDER — HEPARIN SODIUM (PORCINE) 5000 UNIT/ML IJ SOLN
5000.0000 [IU] | Freq: Three times a day (TID) | INTRAMUSCULAR | Status: DC
  Administered 2017-05-23 – 2017-05-24 (×3): 5000 [IU] via SUBCUTANEOUS
  Filled 2017-05-23 (×3): qty 1

## 2017-05-23 MED ORDER — POTASSIUM CHLORIDE IN NACL 20-0.9 MEQ/L-% IV SOLN
INTRAVENOUS | Status: DC
  Administered 2017-05-23 – 2017-05-27 (×8): via INTRAVENOUS
  Filled 2017-05-23 (×13): qty 1000

## 2017-05-23 MED ORDER — ACETAMINOPHEN 650 MG RE SUPP
650.0000 mg | Freq: Four times a day (QID) | RECTAL | Status: DC | PRN
  Administered 2017-05-23 (×2): 650 mg via RECTAL
  Filled 2017-05-23 (×2): qty 1

## 2017-05-23 MED ORDER — INSULIN ASPART 100 UNIT/ML ~~LOC~~ SOLN
0.0000 [IU] | SUBCUTANEOUS | Status: DC
  Administered 2017-05-24: 1 [IU] via SUBCUTANEOUS
  Administered 2017-05-24: 2 [IU] via SUBCUTANEOUS
  Administered 2017-05-24: 1 [IU] via SUBCUTANEOUS
  Administered 2017-05-25: 2 [IU] via SUBCUTANEOUS
  Administered 2017-05-25 – 2017-05-29 (×8): 1 [IU] via SUBCUTANEOUS
  Administered 2017-05-29: 2 [IU] via SUBCUTANEOUS
  Administered 2017-05-29: 1 [IU] via SUBCUTANEOUS

## 2017-05-23 MED ORDER — IOPAMIDOL (ISOVUE-370) INJECTION 76%
INTRAVENOUS | Status: AC
  Administered 2017-05-23: 90 mL
  Filled 2017-05-23: qty 100

## 2017-05-23 MED ORDER — ACETAMINOPHEN 325 MG PO TABS
650.0000 mg | ORAL_TABLET | Freq: Four times a day (QID) | ORAL | Status: DC | PRN
  Administered 2017-05-25 – 2017-05-31 (×4): 650 mg via ORAL
  Filled 2017-05-23 (×4): qty 2

## 2017-05-23 MED ORDER — STROKE: EARLY STAGES OF RECOVERY BOOK
Freq: Once | Status: AC
  Administered 2017-05-23: 19:00:00
  Filled 2017-05-23: qty 1

## 2017-05-23 MED ORDER — INSULIN ASPART 100 UNIT/ML ~~LOC~~ SOLN: 0.0000 [IU] | Freq: Three times a day (TID) | SUBCUTANEOUS | Status: DC

## 2017-05-23 MED ORDER — CEFTRIAXONE SODIUM 1 G IJ SOLR
1.0000 g | INTRAMUSCULAR | Status: DC
  Administered 2017-05-24: 1 g via INTRAVENOUS
  Filled 2017-05-23 (×3): qty 10

## 2017-05-23 MED ORDER — ASPIRIN 300 MG RE SUPP
300.0000 mg | Freq: Every day | RECTAL | Status: DC
  Administered 2017-05-23 – 2017-05-28 (×4): 300 mg via RECTAL
  Filled 2017-05-23 (×4): qty 1

## 2017-05-23 MED ORDER — ORAL CARE MOUTH RINSE
15.0000 mL | Freq: Two times a day (BID) | OROMUCOSAL | Status: DC
  Administered 2017-05-23 – 2017-06-01 (×17): 15 mL via OROMUCOSAL

## 2017-05-23 MED ORDER — ASPIRIN 325 MG PO TABS
325.0000 mg | ORAL_TABLET | Freq: Every day | ORAL | Status: DC
Start: 2017-05-23 — End: 2017-06-01
  Administered 2017-05-25 – 2017-06-01 (×6): 325 mg via ORAL
  Filled 2017-05-23 (×7): qty 1

## 2017-05-23 NOTE — ED Notes (Signed)
Main lab called to check on BNP and trop main lab states they have blood and will add on orders at this time.

## 2017-05-23 NOTE — H&P (Signed)
Date: 05/23/2017               Patient Name:  Clayton Green. MRN: [id number]  DOB: 02/24/1951 Age / Sex: 66 y.o., male   PCP: Smitty Cords, DO         Medical Service: Internal Medicine Teaching Service         Attending Physician: Dr. Levert Feinstein, MD    First Contact: Dr. Minda Meo  Pager: [phone]  Second Contact: Dr. Samuella Cota Pager: [phone]       After Hours (After 5p/  First Contact Pager: [phone]  weekends / holidays): Second Contact Pager: [phone]   Chief Complaint:  left sided weakness, left sided facial droop, and dysarthria  History of Present Illness:  66 yo male with PMHx significant for HTN, HLD, Type 2 DM, CKD III, GERD, hypothyroidism, and MDD presenting with left sided weakness, left sided facial droop, and dysarthria. The patient is unable to provide a history. He is alert and oriented to person only, and does not answer all questions appropriately. Pertinent history was obtained from ED and Neuro providers and chart review. Per ED provider the patient was found by EMS after pressing his life alert button. It is unknown when the patient's symptoms started. EMS had to force entry and noticed the patient's left sided facial droop and left sided weakness and called a code stroke. There was no one else in the apartment to provide a history. He was also found to have urine incontinence at that time. Difficult to assess review of symptoms as the patient had difficulty answering appropriately, but he did respond yes to having chest pain, a headache, and stomach pain. He did not respond when asked about shortness of breath or difficulty breathing.  Neurology was consulted in the Emergency department and due to unknown time of symptom onset tPA was not administered. CT head, Angio of head and neck, and cerebral perfusion study were performed. CT head showed acute/subacute infarction(6 cm) in the right middle cerebral artery territory with mild swelling but no  hemorrhage. Patient found to have elevated POC troponin 0.19, follow up Troponin I 0.21. Initial labs significant for hypokalemia 3.1, elevated creatinine if 2.00, mild leukocytosis of 11.7, and normocytic anemia with a hemoglobin of 12.4, MCV 93.8. UA positive for leukocytes TNTC, and RBC TNTC.   Meds:  No outpatient prescriptions have been marked as taking for the 05/23/17 encounter [facility] Encounter).     Allergies: Allergies as of 05/23/2017 - Review Complete 05/23/2017  Allergen Reaction Noted  . Shellfish allergy Nausea And Vomiting 06/21/2016   Past Medical History:  Diagnosis Date  . Acquired hypothyroidism    a. s/p thyroidectomy.  . Arthritis   . Depression   . Diabetes mellitus without complication (HCC)   . GERD (gastroesophageal reflux disease)   . Hyperlipidemia   . Hypertension   . Vitamin D deficiency     Family History: Family History  Problem Relation Age of Onset  . Diabetes Mother   . Cancer Father   Unable to obtain from patient, per chart review.  Social History:  Social History  Substance Use Topics  . Smoking status: Never Smoker  . Smokeless tobacco: Never Used  . Alcohol use No  Unable to obtain from patient, per chart review.  Review of Systems: A complete ROS was negative except as per HPI.   Physical Exam: Blood pressure 113/70, pulse 95, temperature 100 F (37.8 C), resp. rate (!) 21,  weight 227 lb 11.8 oz (103.3 kg), SpO2 97 %. Physical Exam  Constitutional: He appears well-developed and well-nourished. He is uncooperative.  HENT:  Head: Normocephalic. Head is with abrasion (Midline of forehead, scabbed and healing) and with contusion (+Infraorbital bruising).  Eyes: Conjunctivae and lids are normal. Right eye exhibits abnormal extraocular motion (Right gaze preference, cannot track ). Left eye exhibits abnormal extraocular motion. Right pupil is not reactive. Left pupil is not reactive (slowly reactive, difficult to assess due to  patient inability to follow commands).  Cardiovascular: Regular rhythm.  Tachycardia present.  Exam reveals distant heart sounds.   Pulses:      Radial pulses are 2+ on the right side, and 2+ on the left side.       Dorsalis pedis pulses are 2+ on the right side, and 0 on the left side.  DP pulse on left audible with bedside doppler.   Pulmonary/Chest: Effort normal and breath sounds normal.  Abdominal: Soft. Bowel sounds are normal. There is no tenderness.  Musculoskeletal: He exhibits edema.       Right knee: He exhibits ecchymosis.  Neurological: He is alert. He displays abnormal reflex. A cranial nerve deficit (Left sided facial droop, +dysarthria, unable to assess sensation ) is present. He displays Babinski's sign on the right side. He displays Babinski's sign on the left side.  Reflex Scores:      Patellar reflexes are 0 on the right side and 0 on the left side. Alert and oriented to person only. Intermittent ability to follow simple commands. Unable to assess sensation or strength on left side. Strength 3/5 right upper extremity. Patient can wiggle toes of right foot, but when asked to lift right leg did not respond.   Skin:  Bilateral chronic venous statis dermatitis. +ulcer on left lower extremity    EKG: personally reviewed my interpretation is low voltage, sinus tachycardia.   CXR: personally reviewed my interpretation is no acute cardiopulmonary process. Stable prominent bronchovascular markings. Assessment & Plan by Problem: Active Problems:   CVA (cerebral vascular accident) (HCC)   Right sided cerebral hemisphere cerebrovascular accident (CVA) (HCC)  Right MCA CVA Patient with acute onset of left sided weakness, left facial droop, dysarthria, right sided gaze preference of unknown duration so tPA was not adminstered. CT imaging of the head shows acute/subacute infarction in the right middle cerebral artery territory affecting the insula and frontoparietal region. Mild  swelling but no mass effect or hemorrhage. Perfusion study showed complete infarction of the insular and posterior frontal region. CT angio of the brain shows missing M2 to M3 branch on the right without evidence of a retrievable proximal thrombus, corresponding to the region of infarction. Admit to step down for stroke work up. Patient failed speech and swallow evaluation in the ED.  -MRI brain without contrast -Echocardiogram -Telemetry -Q2 hour neuro checks -ASA 300 mg per rectum daily  -NPO  -PT/OT   Elevated Troponin POC troponin 0.19. Troponin I 0.21. EKG shows no acute ischemic changes. HEART Score of 5 with risk factors of age, type 2 diabetes, and HLD. Difficult to clarify if patient is symptomatic. In July 2018, was admitted for fall and acute rhabdomyolysis and had elevated troponin that peaked at about ~8 . Cardiology ruled out NSTEMI and attributed the elevated troponin to rhabdomyolsis. Most recent ECHO performed in 02/2017 showed normal systolic function with EF 50-55%, and normal wall motion. -Continue to trend troponin q6 hr -Telemetry -Will be getting ECHO for stroke eval -Repeat EKG  in AM  Fever Patient developed fever up to 101.6. UA dirty, CXR negative for pneumonia or signs of infection. Patient has mild leukocytosis of 11.7. Fever can be secondary to acute nonhemorrhagic stroke, but infectious etiology is also possible.  -Urine Cultures -Blood cultures -Ceftriaxone 1 g q24 hours -CBC in AM  AKI Elevated creatinine 2.00>>2.09 on admission. Most likely pre-renal due to dehydration. Per chart review the patient has a documented history of CKD III. In 06/2016 creatinine level was within normal limits ranging between 0.75 and 1. In 02/2017 patient's creatinine increased to 1.87 after acute rhabdomyolsis and dehydration s/p fall. Since that time the patients creatinine has ranged between 1.1-1.7.  -IVF resuscitation @ 100 cc/hr  -BMET in AM  Hypokalemia  3.1 on arrival to  ED>>improved to 3.6.  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  -On metformin 500 mg BID at home -Hgb A1C pending  -Insulin SS TID with meals  -Patient currently NPO  HTN, HLD BP have been soft ranging between 95-120 systolic and 54-70 diastolic.  -Home regimen includes Lisinopril 10 mg, ASA 81 mg, Lipitor 20 mg -Holding HTN medications and statin, patient NPO   Hypothryoidsim Post surgical hypothyroidism, on Levothyroxine 88 mcg BID -TSH f/u -IV Levothyroxine 88 mcg   Chronic venous insufficiency Patient with +1 lower extremity edema and small venous statis ulcer on left lower extremity.  -Consulted wound care >> appreciate recommendations  -Takes Lasix 20 mg PRN at home  Major Depressive Disorder -Home regimen includes Paxil 30 mg and  Trazodone 100 mg.   F: 100 cc/hr NS E: 20 meq IV K+, Repletion as needed N: NPO DVT prophylaxis: SQ heparin    Dispo: Admit patient to Inpatient with expected length of stay greater than 2 midnights.  Signed: Toney Rakes, MD 05/23/2017, 12:50 PM  Pager: [phone]

## 2017-05-23 NOTE — ED Notes (Signed)
Admitting at bedside 

## 2017-05-23 NOTE — ED Provider Notes (Signed)
[facility]-EMERGENCY DEPT Provider Note   CSN: [id number] Arrival date & time: 05/23/17  7829     History   Chief Complaint Chief Complaint  Patient presents with  . Code Stroke    HPI Clayton Green. is a 66 y.o. male.  HPI  The patient is a 66 year old male, he has a known history of diabetes, hypertension, hyperlipidemia, hypothyroidism status post thyroidectomy. He is also known to have stage III chronic kidney disease, depression. He pushed his medical alert bracelet this morning, he was unable to get much in way of history but was found to have some facial droop and weakness with a right-sided gaze. It is unclear when this started, the patient is unable to give [place] any valuable information regarding timing of onset. The patient is critically ill with what appears to be a stroke and is unable to give much information, level V caveat applies  Past Medical History:  Diagnosis Date  . Acquired hypothyroidism    a. s/p thyroidectomy.  . Arthritis   . Depression   . Diabetes mellitus without complication (HCC)   . GERD (gastroesophageal reflux disease)   . Hyperlipidemia   . Hypertension   . Vitamin D deficiency     Patient Active Problem List   Diagnosis Date Noted  . CKD (chronic kidney disease), stage III (HCC) 05/20/2017  . Insomnia 05/20/2017  . Cervical stenosis of spine 05/20/2017  . Hyperlipidemia associated with type 2 diabetes mellitus (HCC) 09/11/2016  . Chronic low back pain 09/11/2016  . Degenerative joint disease (DJD) of lumbar spine 09/11/2016  . GERD (gastroesophageal reflux disease) 08/12/2016  . Chronic venous insufficiency 08/05/2016  . Elevated serum creatinine 08/05/2016  . Biliary colic 06/21/2016  . Cholelithiasis 06/21/2016  . 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 Vitamin D deficiency 03/28/2015  . 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  Essential hypertension 03/28/2015    Past Surgical History:  Procedure Laterality Date  . colonscopy    . THYROIDECTOMY, PARTIAL         Home Medications    Prior to Admission medications   Medication Sig Start Date End Date Taking? Authorizing Provider  acetaminophen (TYLENOL) 500 MG tablet Take 500 mg by mouth 2 (two) times daily as needed.    [provider]  aspirin 81 MG tablet Take 1 tablet (81 mg total) by mouth daily. 09/21/16   Karamalegos, Netta Neat, DO  atorvastatin (LIPITOR) 20 MG tablet Take 1 tablet (20 mg total) by mouth daily. 05/20/17   Karamalegos, Netta Neat, DO  Cholecalciferol 2000 units CAPS Take 1 capsule (2,000 Units total) by mouth daily. 08/24/16   Karamalegos, Netta Neat, DO  furosemide (LASIX) 20 MG tablet Take 1 tablet (20 mg total) by mouth every other day. May take extra dose or daily as needed if worsening swelling. 05/20/17   Karamalegos, Netta Neat, DO  levothyroxine (SYNTHROID, LEVOTHROID) 88 MCG tablet Take 2 tablets (176 mcg total) by mouth daily before breakfast. 09/11/16   Karamalegos, Netta Neat, DO  lisinopril (PRINIVIL,ZESTRIL) 10 MG tablet Take 1 tablet (10 mg total) by mouth daily. 05/20/17   Karamalegos, Netta Neat, DO  meloxicam (MOBIC) 15 MG tablet Take 1 tablet (15 mg total) by mouth daily as needed for pain. 05/20/17   Karamalegos, Netta Neat, DO  metFORMIN (GLUCOPHAGE) 500 MG tablet Take 1 tablet (500 mg total) by mouth 2 (two) times daily with a  meal. 05/20/17   Karamalegos, Netta Neat, DO  mineral oil-hydrophilic petrolatum (AQUAPHOR) ointment Apply topically as needed for dry skin. Apply to legs daily. 05/14/15   Janeann Forehand., MD  ondansetron (ZOFRAN ODT) 4 MG disintegrating tablet Take 1 tablet (4 mg total) by mouth every 8 (eight) hours as needed for nausea or vomiting. 05/20/17   Karamalegos, Netta Neat, DO  PARoxetine (PAXIL) 30 MG tablet Take 1 tablet (30 mg total) by mouth daily. 05/20/17   Karamalegos, Netta Neat,  DO  ranitidine (ZANTAC) 150 MG tablet Take 1 tablet (150 mg total) by mouth at bedtime. 05/20/17   Karamalegos, Netta Neat, DO  traZODone (DESYREL) 100 MG tablet Take 1 tablet (100 mg total) by mouth at bedtime. 05/20/17   Smitty Cords, DO    Family History Family History  Problem Relation Age of Onset  . Diabetes Mother   . Cancer Father     Social History Social History  Substance Use Topics  . Smoking status: Never Smoker  . Smokeless tobacco: Never Used  . Alcohol use No     Allergies   Shellfish allergy   Review of Systems Review of Systems  Unable to perform ROS: Acuity of condition     Physical Exam Updated Vital Signs BP (!) 95/56   Pulse 86   Temp 100 F (37.8 C)   Resp 14   Wt 103.3 kg (227 lb 11.8 oz)   SpO2 100%   BMI 35.67 kg/m   Physical Exam  Constitutional: He appears well-developed and well-nourished. No distress.  HENT:  Head: Normocephalic and atraumatic.  Mouth/Throat: Oropharynx is clear and moist. No oropharyngeal exudate.  Eyes: Pupils are equal, round, and reactive to light. Conjunctivae and EOM are normal. Right eye exhibits no discharge. Left eye exhibits no discharge. No scleral icterus.  R sided Gaze  Neck: Normal range of motion. Neck supple. No JVD present. No thyromegaly present.  Cardiovascular: Regular rhythm, normal heart sounds and intact distal pulses.  Exam reveals no gallop and no friction rub.   No murmur heard. Tachycardia, edema present of the bilateral lower extremities  Pulmonary/Chest: Effort normal and breath sounds normal. No respiratory distress. He has no wheezes. He has no rales.  Abdominal: Soft. Bowel sounds are normal. He exhibits no distension and no mass. There is no tenderness.  Musculoskeletal: He exhibits edema, tenderness and deformity.  Decreased range of motion of the right hip, right knee, swelling and bruising present around the right knee  Lymphadenopathy:    He has no cervical  adenopathy.  Neurological: He is alert. Coordination normal.  R sided gaze - weakness unilaterally on the left both arm and leg, left-sided facial droop  Skin: Skin is warm and dry. No rash noted. No erythema.  Bruising around the knee  Psychiatric: He has a normal mood and affect. His behavior is normal.  Nursing note and vitals reviewed.    ED Treatments / Results  Labs (all labs ordered are listed, but only abnormal results are displayed) Labs Reviewed  CBC - Abnormal; Notable for the following:       Result Value   WBC 11.7 (*)    RBC 4.20 (*)    Hemoglobin 12.4 (*)    RDW 17.8 (*)    All other components within normal limits  DIFFERENTIAL - Abnormal; Notable for the following:    Neutro Abs 9.1 (*)    Monocytes Absolute 1.3 (*)    All other components within normal  limits  COMPREHENSIVE METABOLIC PANEL - Abnormal; Notable for the following:    Chloride 99 (*)    Glucose, Bld 134 (*)    BUN 49 (*)    Creatinine, Ser 2.09 (*)    Calcium 8.7 (*)    Albumin 3.3 (*)    Alkaline Phosphatase 214 (*)    GFR calc non Af Amer 31 (*)    GFR calc Af Amer 36 (*)    All other components within normal limits  I-STAT TROPONIN, ED - Abnormal; Notable for the following:    Troponin i, poc 0.19 (*)    All other components within normal limits  CBG MONITORING, ED - Abnormal; Notable for the following:    Glucose-Capillary 106 (*)    All other components within normal limits  I-STAT CHEM 8, ED - Abnormal; Notable for the following:    Potassium 3.1 (*)    Chloride 99 (*)    BUN 47 (*)    Creatinine, Ser 2.00 (*)    Glucose, Bld 109 (*)    Calcium, Ion 0.99 (*)    Hemoglobin 12.9 (*)    HCT 38.0 (*)    All other components within normal limits  URINE CULTURE  PROTIME-INR  APTT  BRAIN NATRIURETIC PEPTIDE  TROPONIN I  URINALYSIS, ROUTINE W REFLEX MICROSCOPIC    EKG  EKG Interpretation  Date/Time:  Sunday May 23 2017 08:38:28 EDT Ventricular Rate:  101 PR Interval:      QRS Duration: 114 QT Interval:  375 QTC Calculation: 487 R Axis:   105 Text Interpretation:  Sinus tachycardia Multiform ventricular premature complexes Incomplete right bundle branch block Low voltage, extremity and precordial leads Borderline prolonged QT interval Since last tracing LOW VOLTAGE Confirmed by Eber Hong ([id number]) on 05/23/2017 8:45:30 AM       Radiology Ct Angio Head W Or Wo Contrast  Result Date: 05/23/2017 CLINICAL DATA:  Acute presentation with left-sided weakness. Age indeterminate. EXAM: CT ANGIOGRAPHY HEAD AND NECK CT PERFUSION BRAIN TECHNIQUE: Multidetector CT imaging of the head and neck was performed using the standard protocol during bolus administration of intravenous contrast. Multiplanar CT image reconstructions and MIPs were obtained to evaluate the vascular anatomy. Carotid stenosis measurements (when applicable) are obtained utilizing NASCET criteria, using the distal internal carotid diameter as the denominator. Multiphase CT imaging of the brain was performed following IV bolus contrast injection. Subsequent parametric perfusion maps were calculated using RAPID software. CONTRAST:  90 cc Isovue 370 COMPARISON:  CT earlier same day FINDINGS: CTA NECK FINDINGS Aortic arch: Minimal atherosclerosis at the aortic arch and brachiocephalic vessel origins. No advanced disease. No aneurysm or dissection. Left vertebral artery arises directly from the arch. Right carotid system: Common carotid artery widely patent to the bifurcation region. Minimal atherosclerotic plaque in the ICA bulb but no stenosis or irregularity. Cervical ICA appears widely patent. Left carotid system: Common carotid artery widely patent to the bifurcation. Minimal atherosclerotic plaque at the bifurcation but no stenosis or irregularity. Cervical internal carotid artery is normal. Vertebral arteries: Right vertebral artery origin is widely patent. Non dominant right vertebral widely patent through the  cervical region. Left vertebral artery arises from the arch. Minimal atherosclerotic plaque at the origin but no stenosis. The vessel is widely patent through the cervical region. Skeleton: Mid cervical spondylosis with prominent osteophytes and ligamentous calcification at C5-6 resulting and spinal stenosis that could be significant, with cord deformity. Other neck: No mass or lymphadenopathy. Upper chest: Likely benign subpleural nodule seen  in the right lateral chest on the last image. No other pulmonary finding. Review of the MIP images confirms the above findings CTA HEAD FINDINGS Anterior circulation: Both internal carotid arteries are widely patent through the carotid siphon regions. Focal calcified plaque in both distal siphons but no significant stenosis. The left middle cerebral artery territory is normal. Both anterior cerebral artery is are normal. Right MCA appears normal in the M1 and proximal M2 regions. There is probably a missing M2 -M3 branch serving the region of infarction of the insula and posterior frontal brain. No evidence of proximal retrievable thrombus. Posterior circulation: Both vertebral arteries are widely patent through the foramen magnum to the basilar. There is mild atherosclerotic narrowing of the distal basilar, not flow limiting. Superior cerebellar and posterior cerebral arteries appear normal. Venous sinuses: Patent and normal. Anatomic variants: None significant Delayed phase: No abnormal enhancement Review of the MIP images confirms the above findings CT Brain Perfusion Findings: CBF (<30%) Volume: 18mL Perfusion (Tmax>6.0s) volume: 84mL. Some of this calculated volume is demonstrated in the left anterior cerebral artery territory, which I do not believe is pathologic based on the other findings. Therefore, this volume is probably exaggerated somewhat. Mismatch Volume: 66mL Infarction Location:Right insular region and posterior frontal cortical and subcortical brain.  IMPRESSION: Missing M2 to M3 branch on the right without evidence of a retrievable proximal thrombus. This corresponds to the region of infarction demonstrated and noncontrast CT and for fusion. No upstream vascular lesion seen to predispose to stroke. Minimal atherosclerotic changes without stenosis or irregularity. Perfusion imaging shows complete infarction of 18 cc in the insular and posterior frontal region. Surrounding region of diminished for fusion is calculated at 66 cc, but this is probably exaggerated based on inclusion of some left ACA territory brain, which I think is probably normal based on the vascular imaging. These findings were discussed with Dr. Wilford Corner at the time of performance at 0819 hours. Electronically Signed   By: Paulina Fusi M.D.   On: 05/23/2017 08:38   Ct Angio Neck W Or Wo Contrast  Result Date: 05/23/2017 CLINICAL DATA:  Acute presentation with left-sided weakness. Age indeterminate. EXAM: CT ANGIOGRAPHY HEAD AND NECK CT PERFUSION BRAIN TECHNIQUE: Multidetector CT imaging of the head and neck was performed using the standard protocol during bolus administration of intravenous contrast. Multiplanar CT image reconstructions and MIPs were obtained to evaluate the vascular anatomy. Carotid stenosis measurements (when applicable) are obtained utilizing NASCET criteria, using the distal internal carotid diameter as the denominator. Multiphase CT imaging of the brain was performed following IV bolus contrast injection. Subsequent parametric perfusion maps were calculated using RAPID software. CONTRAST:  90 cc Isovue 370 COMPARISON:  CT earlier same day FINDINGS: CTA NECK FINDINGS Aortic arch: Minimal atherosclerosis at the aortic arch and brachiocephalic vessel origins. No advanced disease. No aneurysm or dissection. Left vertebral artery arises directly from the arch. Right carotid system: Common carotid artery widely patent to the bifurcation region. Minimal atherosclerotic plaque in  the ICA bulb but no stenosis or irregularity. Cervical ICA appears widely patent. Left carotid system: Common carotid artery widely patent to the bifurcation. Minimal atherosclerotic plaque at the bifurcation but no stenosis or irregularity. Cervical internal carotid artery is normal. Vertebral arteries: Right vertebral artery origin is widely patent. Non dominant right vertebral widely patent through the cervical region. Left vertebral artery arises from the arch. Minimal atherosclerotic plaque at the origin but no stenosis. The vessel is widely patent through the cervical region. Skeleton: Mid  cervical spondylosis with prominent osteophytes and ligamentous calcification at C5-6 resulting and spinal stenosis that could be significant, with cord deformity. Other neck: No mass or lymphadenopathy. Upper chest: Likely benign subpleural nodule seen in the right lateral chest on the last image. No other pulmonary finding. Review of the MIP images confirms the above findings CTA HEAD FINDINGS Anterior circulation: Both internal carotid arteries are widely patent through the carotid siphon regions. Focal calcified plaque in both distal siphons but no significant stenosis. The left middle cerebral artery territory is normal. Both anterior cerebral artery is are normal. Right MCA appears normal in the M1 and proximal M2 regions. There is probably a missing M2 -M3 branch serving the region of infarction of the insula and posterior frontal brain. No evidence of proximal retrievable thrombus. Posterior circulation: Both vertebral arteries are widely patent through the foramen magnum to the basilar. There is mild atherosclerotic narrowing of the distal basilar, not flow limiting. Superior cerebellar and posterior cerebral arteries appear normal. Venous sinuses: Patent and normal. Anatomic variants: None significant Delayed phase: No abnormal enhancement Review of the MIP images confirms the above findings CT Brain Perfusion  Findings: CBF (<30%) Volume: 18mL Perfusion (Tmax>6.0s) volume: 84mL. Some of this calculated volume is demonstrated in the left anterior cerebral artery territory, which I do not believe is pathologic based on the other findings. Therefore, this volume is probably exaggerated somewhat. Mismatch Volume: 66mL Infarction Location:Right insular region and posterior frontal cortical and subcortical brain. IMPRESSION: Missing M2 to M3 branch on the right without evidence of a retrievable proximal thrombus. This corresponds to the region of infarction demonstrated and noncontrast CT and for fusion. No upstream vascular lesion seen to predispose to stroke. Minimal atherosclerotic changes without stenosis or irregularity. Perfusion imaging shows complete infarction of 18 cc in the insular and posterior frontal region. Surrounding region of diminished for fusion is calculated at 66 cc, but this is probably exaggerated based on inclusion of some left ACA territory brain, which I think is probably normal based on the vascular imaging. These findings were discussed with Dr. Wilford Corner at the time of performance at 0819 hours. Electronically Signed   By: Paulina Fusi M.D.   On: 05/23/2017 08:38   Ct Cerebral Perfusion W Contrast  Result Date: 05/23/2017 CLINICAL DATA:  Acute presentation with left-sided weakness. Age indeterminate. EXAM: CT ANGIOGRAPHY HEAD AND NECK CT PERFUSION BRAIN TECHNIQUE: Multidetector CT imaging of the head and neck was performed using the standard protocol during bolus administration of intravenous contrast. Multiplanar CT image reconstructions and MIPs were obtained to evaluate the vascular anatomy. Carotid stenosis measurements (when applicable) are obtained utilizing NASCET criteria, using the distal internal carotid diameter as the denominator. Multiphase CT imaging of the brain was performed following IV bolus contrast injection. Subsequent parametric perfusion maps were calculated using RAPID  software. CONTRAST:  90 cc Isovue 370 COMPARISON:  CT earlier same day FINDINGS: CTA NECK FINDINGS Aortic arch: Minimal atherosclerosis at the aortic arch and brachiocephalic vessel origins. No advanced disease. No aneurysm or dissection. Left vertebral artery arises directly from the arch. Right carotid system: Common carotid artery widely patent to the bifurcation region. Minimal atherosclerotic plaque in the ICA bulb but no stenosis or irregularity. Cervical ICA appears widely patent. Left carotid system: Common carotid artery widely patent to the bifurcation. Minimal atherosclerotic plaque at the bifurcation but no stenosis or irregularity. Cervical internal carotid artery is normal. Vertebral arteries: Right vertebral artery origin is widely patent. Non dominant right vertebral widely  patent through the cervical region. Left vertebral artery arises from the arch. Minimal atherosclerotic plaque at the origin but no stenosis. The vessel is widely patent through the cervical region. Skeleton: Mid cervical spondylosis with prominent osteophytes and ligamentous calcification at C5-6 resulting and spinal stenosis that could be significant, with cord deformity. Other neck: No mass or lymphadenopathy. Upper chest: Likely benign subpleural nodule seen in the right lateral chest on the last image. No other pulmonary finding. Review of the MIP images confirms the above findings CTA HEAD FINDINGS Anterior circulation: Both internal carotid arteries are widely patent through the carotid siphon regions. Focal calcified plaque in both distal siphons but no significant stenosis. The left middle cerebral artery territory is normal. Both anterior cerebral artery is are normal. Right MCA appears normal in the M1 and proximal M2 regions. There is probably a missing M2 -M3 branch serving the region of infarction of the insula and posterior frontal brain. No evidence of proximal retrievable thrombus. Posterior circulation: Both  vertebral arteries are widely patent through the foramen magnum to the basilar. There is mild atherosclerotic narrowing of the distal basilar, not flow limiting. Superior cerebellar and posterior cerebral arteries appear normal. Venous sinuses: Patent and normal. Anatomic variants: None significant Delayed phase: No abnormal enhancement Review of the MIP images confirms the above findings CT Brain Perfusion Findings: CBF (<30%) Volume: 18mL Perfusion (Tmax>6.0s) volume: 84mL. Some of this calculated volume is demonstrated in the left anterior cerebral artery territory, which I do not believe is pathologic based on the other findings. Therefore, this volume is probably exaggerated somewhat. Mismatch Volume: 66mL Infarction Location:Right insular region and posterior frontal cortical and subcortical brain. IMPRESSION: Missing M2 to M3 branch on the right without evidence of a retrievable proximal thrombus. This corresponds to the region of infarction demonstrated and noncontrast CT and for fusion. No upstream vascular lesion seen to predispose to stroke. Minimal atherosclerotic changes without stenosis or irregularity. Perfusion imaging shows complete infarction of 18 cc in the insular and posterior frontal region. Surrounding region of diminished for fusion is calculated at 66 cc, but this is probably exaggerated based on inclusion of some left ACA territory brain, which I think is probably normal based on the vascular imaging. These findings were discussed with Dr. Wilford Corner at the time of performance at 0819 hours. Electronically Signed   By: Paulina Fusi M.D.   On: 05/23/2017 08:38   Dg Chest Port 1 View  Result Date: 05/23/2017 CLINICAL DATA:  Short of breath.  Stroke. EXAM: PORTABLE CHEST 1 VIEW COMPARISON:  03/01/2017 FINDINGS: Cardiac silhouette is normal in size. No mediastinal or hilar masses. There are prominent bronchovascular markings similar to the prior study. No evidence of pneumonia or pulmonary  edema. No convincing pleural effusion and no pneumothorax. Skeletal structures are grossly intact. IMPRESSION: No acute cardiopulmonary disease. Electronically Signed   By: Amie Portland M.D.   On: 05/23/2017 10:05   Dg Knee Right Port  Result Date: 05/23/2017 CLINICAL DATA:  Stroke.  Knee pain. EXAM: PORTABLE RIGHT KNEE - 1-2 VIEW COMPARISON:  None. FINDINGS: Suboptimal positioning. No evidence of fracture, dislocation or joint effusion. Some osteoarthritis, most pronounced in the medial compartment and patellofemoral joint. IMPRESSION: No acute finding. 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 Electronically Signed   By: Paulina Fusi M.D.   On: 05/23/2017 09:40   Dg Hip Port Unilat W Or Wo Pelvis 1 View Right  Result Date: 05/23/2017 CLINICAL DATA:  Right hip pain. EXAM: DG HIP (WITH  OR WITHOUT PELVIS) 1V PORT RIGHT COMPARISON:  03/01/2017 FINDINGS: No convincing fracture. There are advanced arthropathic changes of the right hip with remottling of the femoral head, concentric joint space narrowing, subchondral sclerosis and protrusio acetabula, stable from the prior exam. Skeletal structures are demineralized. Soft tissues are grossly unremarkable. IMPRESSION: 1. No convincing fracture. 2. 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 Electronically Signed   By: Amie Portland M.D.   On: 05/23/2017 09:41   Ct Head Code Stroke Wo Contrast  Result Date: 05/23/2017 CLINICAL DATA:  Code stroke.  Left-sided weakness, unknown duration. EXAM: CT HEAD WITHOUT CONTRAST TECHNIQUE: Contiguous axial images were obtained from the base of the skull through the vertex without intravenous contrast. COMPARISON:  05/08/2017 FINDINGS: Brain: There is acute/subacute infarction in the right middle cerebral artery territory affecting the insula and frontoparietal region. Mild swelling but no mass effect or hemorrhage. Elsewhere, there is old infarction in the left  parieto-occipital region. No hydrocephalus or extra-axial collection. No mass lesion. Vascular: Question slight hyperdensity of the right MCA. Skull: Negative Sinuses/Orbits: Clear/normal Other: None ASPECTS ([place] Stroke Program Early CT Score) - Ganglionic level infarction (caudate, lentiform nuclei, internal capsule, insula, M1-M3 cortex): 5 - Supraganglionic infarction (M4-M6 cortex): 1 Total score (0-10 with 10 being normal): 6 IMPRESSION: 1. Acute/subacute infarction in the right middle cerebral artery territory with mild swelling but no hemorrhage. I would estimate that this is greater than 8 hours old given these findings. Region of infarction measures approximately 6 cm. 2. ASPECTS is 6 These results were called by telephone at the time of interpretation on 05/23/2017 at 7:56 am to Dr. Wilford Corner , who verbally acknowledged these results. Electronically Signed   By: Paulina Fusi M.D.   On: 05/23/2017 08:02    Procedures .Critical Care Performed by: Eber Hong Authorized by: Eber Hong   Critical care provider statement:    Critical care time was exclusive of:  Separately billable procedures and treating other patients   Critical care was necessary to treat or prevent imminent or life-threatening deterioration of the following conditions:  Cardiac failure and CNS failure or compromise   Critical care was time spent personally by me on the following activities:  Blood draw for specimens, discussions with consultants, evaluation of patient's response to treatment, examination of patient, ordering and performing treatments and interventions, ordering and review of laboratory studies, ordering and review of radiographic studies, pulse oximetry, re-evaluation of patient's condition and review of old charts    (including critical care time)  Medications Ordered in ED Medications  iopamidol (ISOVUE-370) 76 % injection (90 mLs  Contrast Given 05/23/17 0800)     Initial Impression / Assessment  and Plan / ED Course  I have reviewed the triage vital signs and the nursing notes.  Pertinent labs & imaging results that were available during my care of the patient were reviewed by me and considered in my medical decision making (see chart for details).   The patient is critically ill with what appears to be a large territory stroke however this has been going long enough that thrombolytic therapy is contraindicated. After discussion with the neurologist the decision was made to place the patient in a high level of care including a stepdown unit. We'll need to further evaluate for the cause of the swelling including his history of congestive heart failure. He may have had a hip fracture given his leg length discrepancy and right-sided leg deformity. We'll discuss with the  hospitalist for admission. Critical care provided for this patient was critically ill with a large territory stroke with systemic and severe symptoms.  Labs reviewed showing mild anemia, mild leukocytosis, acute kidney injury.  Labs discussed with patient and with consulting physician with the internal medicine resident service. The patient will be admitted to the stepdown unit.   Final Clinical Impressions(s) / ED Diagnoses   Final diagnoses:  Acute ischemic stroke (HCC)  AKI (acute kidney injury) (HCC)    New Prescriptions New Prescriptions   No medications on file     Eber Hong, MD 05/23/17 1018

## 2017-05-23 NOTE — ED Notes (Signed)
Pt will get MRI today.

## 2017-05-23 NOTE — ED Notes (Signed)
Pt CBG was 106, notified Jessica(RN)

## 2017-05-23 NOTE — ED Notes (Signed)
Pt's apartment keys are in a pink denture cup with pt's name on them and will be kept at bedside.

## 2017-05-23 NOTE — Consult Note (Addendum)
Neurology Consultation  Reason for Consult: code stroke Referring Physician: Dr Miller - EDP  CC: Left-sided weakness  History is obtained from: EMS and chart  HPI: Clayton J Mccabe Jr. is a 66 y.o. male  was past medical history documented in the chart of diabetes, hypertension, hyperlipidemia, depression, hypothyroidism, venous stasis ulcers of both legs , chronic diastolic heart failure, who was brought in by [facility] when they were called into his house when he presses life alert button. Upon arrival to his house, they found him not being able to move the left side, and extremely slurred speech. They also found that he had urine incontinence of the time. He could not tell them when his symptoms started. There was no one else at the apartment to provide the history. With the concern for stroke, he was brought straight to the emergency room over at [facility] and a code stroke was activated. Upon chart review, the patient was recently in [facility] for evaluation of falls and scalp hematoma. It is gleaned from the chart that he has been having significant difficulties with ambulation for a while. He is unable to provide history. Difficulty just last known normal and difficult to guess the modified Rankin scale  LKW: Unknown tpa given?: no, unknown last known normal, established hypodensity on CT Premorbid modified Rankin scale (mRS): Best guess 3 or 4   ROS:  Unable to obtain due to altered mental status.   Past Medical History:  Diagnosis Date  . Acquired hypothyroidism    a. s/p thyroidectomy.  . Arthritis   . Depression   . Diabetes mellitus without complication (HCC)   . GERD (gastroesophageal reflux disease)   . Hyperlipidemia   . Hypertension   . Vitamin D deficiency     Family History  Problem Relation Age of Onset  . Diabetes Mother   . Cancer Father     Social History:   reports that he has never smoked. He has never used smokeless tobacco. He reports that  he does not drink alcohol or use drugs.  Medications No current facility-administered medications for this encounter.   Current Outpatient Prescriptions:  .  acetaminophen (TYLENOL) 500 MG tablet, Take 500 mg by mouth 2 (two) times daily as needed., Disp: , Rfl:  .  aspirin 81 MG tablet, Take 1 tablet (81 mg total) by mouth daily., Disp: 30 tablet, Rfl: 11 .  atorvastatin (LIPITOR) 20 MG tablet, Take 1 tablet (20 mg total) by mouth daily., Disp: 30 tablet, Rfl: 11 .  Cholecalciferol 2000 units CAPS, Take 1 capsule (2,000 Units total) by mouth daily., Disp: 90 each, Rfl: 3 .  furosemide (LASIX) 20 MG tablet, Take 1 tablet (20 mg total) by mouth every other day. May take extra dose or daily as needed if worsening swelling., Disp: 30 tablet, Rfl: 5 .  levothyroxine (SYNTHROID, LEVOTHROID) 88 MCG tablet, Take 2 tablets (176 mcg total) by mouth daily before breakfast., Disp: 180 tablet, Rfl: 3 .  lisinopril (PRINIVIL,ZESTRIL) 10 MG tablet, Take 1 tablet (10 mg total) by mouth daily., Disp: 30 tablet, Rfl: 11 .  meloxicam (MOBIC) 15 MG tablet, Take 1 tablet (15 mg total) by mouth daily as needed for pain., Disp: 30 tablet, Rfl: 3 .  metFORMIN (GLUCOPHAGE) 500 MG tablet, Take 1 tablet (500 mg total) by mouth 2 (two) times daily with a meal., Disp: 60 tablet, Rfl: 11 .  mineral oil-hydrophilic petrolatum (AQUAPHOR) ointment, Apply topically as needed for dry skin. Apply to   legs daily., Disp: 420 g, Rfl: 0 .  ondansetron (ZOFRAN ODT) 4 MG disintegrating tablet, Take 1 tablet (4 mg total) by mouth every 8 (eight) hours as needed for nausea or vomiting., Disp: 30 tablet, Rfl: 3 .  PARoxetine (PAXIL) 30 MG tablet, Take 1 tablet (30 mg total) by mouth daily., Disp: 30 tablet, Rfl: 11 .  ranitidine (ZANTAC) 150 MG tablet, Take 1 tablet (150 mg total) by mouth at bedtime., Disp: 30 tablet, Rfl: 11 .  traZODone (DESYREL) 100 MG tablet, Take 1 tablet (100 mg total) by mouth at bedtime., Disp: 30 tablet, Rfl:  11  Exam: Current vital signs: BP 119/61   Pulse (!) 106   Temp 100 F (37.8 C)   Resp (!) 21   Wt 103.3 kg (227 lb 11.8 oz)   SpO2 99%   BMI 35.67 kg/m  Vital signs in last 24 hours: Temp:  [100 F (37.8 C)] 100 F (37.8 C) (10/07 0840) Pulse Rate:  [104-106] 106 (10/07 0835) Resp:  [21-33] 21 (10/07 0835) BP: (119)/(61) 119/61 (10/07 0830) SpO2:  [98 %-100 %] 99 % (10/07 0835) Weight:  [103.3 kg (227 lb 11.8 oz)] 103.3 kg (227 lb 11.8 oz) (10/07 0839)  GENERAL: Awake, alert in NAD HEENT: - Right scalp hematoma over the eye, dry mm, no LN++, no Thyromegally LUNGS - scattered rales all over CV - S1S2 regular rhythm, no m/r/g, equal pulses bilaterally. Slightly tachycardic  ABDOMEN - Soft, nontender, nondistended with normoactive BS Ext:  venous stasis ulcers and bandages in both lower extremities, 2+ pitting edema edema. Bruising on the right knee and right leg  NEURO:  Mental Status: Patient is awake alert. Was able to turn his name. Upon asking his age, he said 66 years old. Could not tell me where he is currently. Neglecting the left side. Language: speech is severely dysarthric . He is extremely inattentive. Could not name repeat. Was able to follow commands intermittently Cranial Nerves: PERRL 3mm/brisk. Forced rightward gaze, visual fields  left hemianopsia, right lower facial weakness  Motor: 5/5 right upper and lower extremity. 3/5 left upper extremity. 3/5 left lower extremity. He kept his right hip flexed and had slightly increased tone in the right hip. Tone: Mostly normal with the exception of slightly increased tone versus stiffness of the right hip Sensation-decreased sensation on the left as evidenced by response to noxious stim Coordination: Couldn't perform Gait- deferred  NIHSS 1a Level of Conscious.: 0 1b LOC Questions: 1 1c LOC Commands: 0  2 Best Gaze: 2 3 Visual: 2 4 Facial Palsy: 1  5a Motor Arm - left: 3 5b Motor Arm - Right: 0  6a Motor Leg -  Left: 2 6b Motor Leg - Right: 0  7 Limb Ataxia: 0 8 Sensory: 1 9 Best Language: 1  10 Dysarthria: 2 11 Extinct. and Inatten.: 2  TOTAL: 17  Labs I have reviewed labs in epic and the results pertinent to this consultation are:  CBC    Component Value Date/Time   WBC 11.7 (H) 05/23/2017 0756   RBC 4.20 (L) 05/23/2017 0756   HGB 12.4 (L) 05/23/2017 0756   HCT 39.4 05/23/2017 0756   PLT 159 05/23/2017 0756   MCV 93.8 05/23/2017 0756   MCH 29.5 05/23/2017 0756   MCHC 31.5 05/23/2017 0756   RDW 17.8 (H) 05/23/2017 0756   LYMPHSABS 1.2 05/23/2017 0756   MONOABS 1.3 (H) 05/23/2017 0756   EOSABS 0.1 05/23/2017 0756   BASOSABS 0.0 05/23/2017 0756      CMP     Component Value Date/Time   NA 137 05/23/2017 0756   K 3.6 05/23/2017 0756   CL 99 (L) 05/23/2017 0756   CO2 24 05/23/2017 0756   GLUCOSE 134 (H) 05/23/2017 0756   BUN 49 (H) 05/23/2017 0756   BUN 14 11/04/2011 0800   CREATININE 2.09 (H) 05/23/2017 0756   CREATININE 1.69 (H) 05/20/2017 1052   CALCIUM 8.7 (L) 05/23/2017 0756   PROT 7.3 05/23/2017 0756   ALBUMIN 3.3 (L) 05/23/2017 0756   AST 39 05/23/2017 0756   ALT 19 05/23/2017 0756   ALKPHOS 214 (H) 05/23/2017 0756   BILITOT 1.0 05/23/2017 0756   GFRNONAA 31 (L) 05/23/2017 0756   GFRNONAA 41 (L) 05/20/2017 1052   GFRAA 36 (L) 05/23/2017 0756   GFRAA 48 (L) 05/20/2017 1052    Lipid Panel     Component Value Date/Time   CHOL 169 03/01/2017 1727   TRIG 162 (H) 03/01/2017 1727   HDL 51 03/01/2017 1727   CHOLHDL 3.3 03/01/2017 1727   VLDL 32 03/01/2017 1727   LDLCALC 86 03/01/2017 1727    Imaging I have reviewed the images obtained:  CT-scan of the brain - established hypodensity in the right MCA territory with loss of gray-white differentiation. No bleed. CT Angela head and neck and CT perfusion study showed Tmax >6 seconds of 84 mL and CV of less than 30% of 18 mL in the right MCA territory, with a mismatch volume of 66 mL. Decreased opacification of M2  and M3 branches on the right with no obvious thrombus.  Assessment:  66-year-old man with a past medical history of diabetes hypertension hyperlipidemia depression hypothyroidism venous stasis ulcers and chronic diastolic heart failure brought in by [facility] for evaluation of left-sided weakness. He was unable to provide history. Unknown last seen normal. CT head findings suggestive of established hypodensity. CT angiogram unremarkable for large vessel occlusion that might be amenable for endovascular treatment. Not a candidate for TPA due to unknown last known normal as well as findings of CT head suggestive of an established hypodensity and possibly might indicate that he was outside the 4-1/2 hour window. Not a candidate for endovascular treatment because of proximal large vessel occlusion plus based on chart review possibly has a modified Rankin of 3 or 4.  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 At this time, I do not think he requires hemicraniectomy watch based on the size of his infarct on CT. An MRI would give us a better picture. HgbA1c, fasting lipid panel MRI brain without contrast Frequent neuro checks Echocardiogram Prophylactic therapy-Antiplatelet med: Aspirin - dose 325mg PO or 300mg PR Atorvastatin 80 Risk factor modification Telemetry monitoring PT consult, OT consult, Speech consult  Evaluation of her fracture and acute kidney injury and other comorbidities per ER and primary team.  Please page stroke NP  Or  PA  Or MD  from 8am -4 pm as this patient will be followed by the stroke team at this point.  You can look them up on [email or web address]     -- Kaydan Wilhoite, MD [facility] [phone]  If 7pm to 7am, please call on call as listed on AMION.  Critical care attestation This patient is critically ill and at  significant risk of neurological worsening, death and care requires constant monitoring of vital signs, hemodynamics,respiratory and cardiac monitoring, neurological assessment, discussion with   family, other specialists and medical decision making of high complexity. I spent 55  minutes of neurocritical care time  in the care of  this patient.   

## 2017-05-23 NOTE — Code Documentation (Signed)
66 y.o. Male from home with PMHx of HTN, HLD, and DM who notified EMS this morning through his Life Alert system. EMS responded and stated they had to force entry. Upon entry, EMS stated patient was sitting in his wheelchair leaning to his left. EMS noted a left-sided facial droop and weakness and called a code stroke. LKW unknown. Patient not able to provide any history and lives alone.    Upon arrival to [facility] ED, the patient presents with bruising to the right orbital area and right knee, was not able to produce much usable speech, severely dysarthric, LUE with no movement against gravity, RUE with drift, neglecting the left side, forced rightward gaze, left hemianopsia, right lower facial asymmetry and decreased left-sided sensation. NIHSS 11. VAN +. See EMR for NIHSS and code stroke times.    Per radiologist, CT revealed an acute/subacute infarction in the right MCA territory with mild swelling but no hemorrhage. Estimate greater than 8 hours old. ASPECTS 6. CTA with no evidence of a retrievable proximal thrombus. CTP core vol 18ml, mismatch vol 66ml, mismatch ratio 4.7. IV tPA not given d/t unknown last known well and established hypodensity on CT. Not an IR candidate d/t PmRS of 3-4. ED bedside handoff with ED RN Shanda Bumps.

## 2017-05-23 NOTE — Progress Notes (Signed)
Pt unable to answer medical questions. Oriented to self only.

## 2017-05-23 NOTE — ED Notes (Signed)
In and out cathter was attempted but due to anatomy unable to pass cathter. Condom cath applied to pt. Pt is clean and dry. Pt's keeps lifting his right leg and holding it in air so two pillows placed under right leg.

## 2017-05-23 NOTE — ED Notes (Signed)
Speech therapist came to bedside to due swallow screen per therapist with should remain NPO even oral medication should be held.

## 2017-05-23 NOTE — ED Triage Notes (Signed)
Pt arrives by [place] em[facility] from his apartment where he lives alone and has caretakers come in to help him with ADL's. Pt pressed his life alert and ems found pt lying on floor. Last known well time unclear pt unable to provide this information. Pt is alert to voice but not oriented to place, time or situation. Pt has garble speech and right sided gaze pt taken to CT with stroke team on arrival.

## 2017-05-23 NOTE — ED Notes (Signed)
ED Provider at bedside. 

## 2017-05-23 NOTE — ED Notes (Signed)
Patient transported to MRI 

## 2017-05-23 NOTE — Evaluation (Signed)
Clinical/Bedside Swallow Evaluation Patient Details  Name: Clayton Green. MRN: [id number] Date of Birth: [birth year]  Today's Date: 05/23/2017 Time: SLP Start Time (ACUTE ONLY): 0930 SLP Stop Time (ACUTE ONLY): 0943 SLP Time Calculation (min) (ACUTE ONLY): 13 min  Past Medical History:  Past Medical History:  Diagnosis Date  . Acquired hypothyroidism    a. s/p thyroidectomy.  . Arthritis   . Depression   . Diabetes mellitus without complication (HCC)   . GERD (gastroesophageal reflux disease)   . Hyperlipidemia   . Hypertension   . Vitamin D deficiency    Past Surgical History:  Past Surgical History:  Procedure Laterality Date  . colonscopy    . THYROIDECTOMY, PARTIAL     HPI:  66 year old man with a past medical history of diabetes hypertension hyperlipidemia depression hypothyroidism venous stasis ulcers and chronic diastolic heart failure brought in by [facility] for evaluation of left-sided weakness. CCT subacute/acute infarction right MCA territory affecting insula and frontoparietal region.  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     Assessment / Plan / Recommendation Clinical Impression  Pt presents with multiple clinical s/s of a neurogenic dysphagia with high aspiration risk.  Pt is oriented to self only; significant right gaze preference, left central CN VII involvement, moderate dysarthria.  Consumption of ice chips and thin liquids leads to overt, wet coughing; oral retention of purees noted in left buccal cavity.  Pt not safe to begin POs.  SLP will f/u next date to determine readiness for instrumental swallow study; d/w RN.  SLP Visit Diagnosis: Dysphagia, unspecified (R13.10)    Aspiration Risk       Diet Recommendation   NPO       Other  Recommendations Oral Care Recommendations: Oral care QID   Follow up Recommendations  (tba)      Frequency and Duration min 2x/week  2 weeks       Prognosis Prognosis for Safe Diet Advancement: Good       Swallow Study   General Date of Onset: 05/23/17 HPI: 66 year old man with a past medical history of diabetes hypertension hyperlipidemia depression hypothyroidism venous stasis ulcers and chronic diastolic heart failure brought in by [facility] for evaluation of left-sided weakness. CCT subacute/acute infarction right MCA territory affecting insula and frontoparietal region.  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   Type of Study: Bedside Swallow Evaluation Previous Swallow Assessment: no Temperature Spikes Noted: Yes Respiratory Status: Room air History of Recent Intubation: No Behavior/Cognition: Alert Oral Cavity Assessment: Within Functional Limits Oral Care Completed by SLP: No Oral Cavity - Dentition: Poor condition;Missing dentition Vision: Impaired for self-feeding Self-Feeding Abilities: Needs assist Patient Positioning: Upright in bed Baseline Vocal Quality: Normal Volitional Cough: Strong    Oral/Motor/Sensory Function Overall Oral Motor/Sensory Function: Moderate impairment Facial ROM: Reduced left;Suspected CN VII (facial) dysfunction Facial Symmetry: Abnormal symmetry left;Suspected CN VII (facial) dysfunction Facial Sensation: Suspected CN V (Trigeminal) dysfunction;Reduced left Lingual Symmetry: Within Functional Limits   Ice Chips Ice chips: Impaired Presentation: Spoon Oral Phase Functional Implications: Left anterior spillage Pharyngeal Phase Impairments: Throat Clearing - Delayed   Thin Liquid Thin Liquid: Impaired Presentation: Cup Pharyngeal  Phase Impairments: Wet Vocal Quality;Cough - Immediate    Nectar Thick Nectar Thick Liquid: Not tested   Honey Thick Honey Thick Liquid: Not tested   Puree Puree: Impaired Presentation: Spoon Oral Phase Functional Implications: Prolonged oral transit Pharyngeal Phase Impairments: Suspected delayed Swallow   Solid   GO   Solid: Not tested  Functional Assessment Tool Used: clinical  judgment Functional Limitations: Swallowing Swallow Current Status (W0981): At least 60 percent but less than 80 percent impaired, limited or restricted Swallow Goal Status [phone]): At least 20 percent but less than 40 percent impaired, limited or restricted   Blenda Mounts Laurice 05/23/2017,9:56 AM Marchelle Folks L. Samson Frederic, [place] CCC/SLP Pager [phone]

## 2017-05-24 ENCOUNTER — Inpatient Hospital Stay (HOSPITAL_COMMUNITY): Payer: Medicare Other

## 2017-05-24 DIAGNOSIS — E1165 Type 2 diabetes mellitus with hyperglycemia: Secondary | ICD-10-CM | POA: Diagnosis present

## 2017-05-24 DIAGNOSIS — E1159 Type 2 diabetes mellitus with other circulatory complications: Secondary | ICD-10-CM

## 2017-05-24 DIAGNOSIS — N3001 Acute cystitis with hematuria: Secondary | ICD-10-CM

## 2017-05-24 DIAGNOSIS — E785 Hyperlipidemia, unspecified: Secondary | ICD-10-CM

## 2017-05-24 DIAGNOSIS — I63411 Cerebral infarction due to embolism of right middle cerebral artery: Secondary | ICD-10-CM

## 2017-05-24 DIAGNOSIS — I6340 Cerebral infarction due to embolism of unspecified cerebral artery: Secondary | ICD-10-CM

## 2017-05-24 DIAGNOSIS — N3000 Acute cystitis without hematuria: Secondary | ICD-10-CM

## 2017-05-24 DIAGNOSIS — E890 Postprocedural hypothyroidism: Secondary | ICD-10-CM

## 2017-05-24 DIAGNOSIS — R609 Edema, unspecified: Secondary | ICD-10-CM

## 2017-05-24 DIAGNOSIS — M79609 Pain in unspecified limb: Secondary | ICD-10-CM

## 2017-05-24 DIAGNOSIS — I10 Essential (primary) hypertension: Secondary | ICD-10-CM

## 2017-05-24 DIAGNOSIS — N179 Acute kidney failure, unspecified: Secondary | ICD-10-CM

## 2017-05-24 LAB — ECHOCARDIOGRAM COMPLETE
AO mean calculated velocity dopler: 151 cm/s
AOPV: 0.6 m/s
AOVTI: 45.8 cm
AV Peak grad: 21 mmHg
AV VEL mean LVOT/AV: 0.56
AV area mean vel ind: 0.92 cm2/m2
AV peak Index: 0.98
AVAREAMEANV: 1.95 cm2
AVAREAVTI: 2.08 cm2
AVAREAVTIIND: 1.08 cm2/m2
AVG: 11 mmHg
AVLVOTPG: 8 mmHg
AVPKVEL: 228 cm/s
CHL CUP AV VEL: 2.3
CHL CUP DOP CALC LVOT VTI: 30.5 cm
E/e' ratio: 13.41
EWDT: 335 ms
FS: 21 % — AB (ref 28–44)
HEIGHTINCHES: 67 in
IVS/LV PW RATIO, ED: 0.83
LA ID, A-P, ES: 32 mm
LA diam end sys: 32 mm
LA diam index: 1.51 cm/m2
LA vol index: 39.9 mL/m2
LA vol: 84.6 mL
LAVOLA4C: 71.4 mL
LDCA: 3.46 cm2
LV E/e'average: 13.41
LV TDI E'LATERAL: 9.17
LV e' LATERAL: 9.17 cm/s
LVEEMED: 13.41
LVOT SV: 106 mL
LVOT diameter: 21 mm
LVOT peak vel: 137 cm/s
LVOTVTI: 0.67 cm
Lateral S' vel: 7.6 cm/s
MV Dec: 335
MV Peak grad: 6 mmHg
MVAP: 1.34 cm2
MVPKAVEL: 136 m/s
MVPKEVEL: 123 m/s
MVSPHT: 98 ms
PW: 12 mm — AB (ref 0.6–1.1)
RV TAPSE: 23 mm
RV sys press: 55 mmHg
Reg peak vel: 317 cm/s
TDI e' medial: 7.93
TR max vel: 317 cm/s
Valve area index: 1.08
Valve area: 2.3 cm2
WEIGHTICAEL: 3558.4 [oz_av]

## 2017-05-24 LAB — GLUCOSE, CAPILLARY
GLUCOSE-CAPILLARY: 115 mg/dL — AB (ref 65–99)
GLUCOSE-CAPILLARY: 118 mg/dL — AB (ref 65–99)
GLUCOSE-CAPILLARY: 124 mg/dL — AB (ref 65–99)
Glucose-Capillary: 127 mg/dL — ABNORMAL HIGH (ref 65–99)
Glucose-Capillary: 105 mg/dL — ABNORMAL HIGH (ref 65–99)
Glucose-Capillary: 158 mg/dL — ABNORMAL HIGH (ref 65–99)

## 2017-05-24 LAB — COMPREHENSIVE METABOLIC PANEL
ALBUMIN: 2.6 g/dL — AB (ref 3.5–5.0)
ALT: 15 U/L — ABNORMAL LOW (ref 17–63)
ANION GAP: 14 (ref 5–15)
AST: 33 U/L (ref 15–41)
Alkaline Phosphatase: 170 U/L — ABNORMAL HIGH (ref 38–126)
BUN: 39 mg/dL — AB (ref 6–20)
CHLORIDE: 105 mmol/L (ref 101–111)
CO2: 23 mmol/L (ref 22–32)
Calcium: 8.2 mg/dL — ABNORMAL LOW (ref 8.9–10.3)
Creatinine, Ser: 1.68 mg/dL — ABNORMAL HIGH (ref 0.61–1.24)
GFR calc Af Amer: 47 mL/min — ABNORMAL LOW (ref >60)
GFR, EST NON AFRICAN AMERICAN: 40 mL/min — AB (ref >60)
GLUCOSE: 124 mg/dL — AB (ref 65–99)
POTASSIUM: 3.2 mmol/L — AB (ref 3.5–5.1)
Sodium: 142 mmol/L (ref 135–145)
TOTAL PROTEIN: 6 g/dL — AB (ref 6.5–8.1)
Total Bilirubin: 1.2 mg/dL (ref 0.3–1.2)

## 2017-05-24 LAB — HEMOGLOBIN A1C
HEMOGLOBIN A1C: 5.9 % — AB (ref 4.8–5.6)
MEAN PLASMA GLUCOSE: 122.63 mg/dL

## 2017-05-24 LAB — CBC
HCT: 33.2 % — ABNORMAL LOW (ref 39.0–52.0)
Hemoglobin: 10.3 g/dL — ABNORMAL LOW (ref 13.0–17.0)
MCH: 28.9 pg (ref 26.0–34.0)
MCHC: 31 g/dL (ref 30.0–36.0)
MCV: 93 fL (ref 78.0–100.0)
PLATELETS: 144 10*3/uL — AB (ref 150–400)
RBC: 3.57 MIL/uL — ABNORMAL LOW (ref 4.22–5.81)
RDW: 17.8 % — AB (ref 11.5–15.5)
WBC: 8.3 10*3/uL (ref 4.0–10.5)

## 2017-05-24 LAB — LIPID PANEL
CHOL/HDL RATIO: 2.2 ratio
Cholesterol: 110 mg/dL (ref 0–200)
HDL: 49 mg/dL (ref >40)
LDL CALC: 43 mg/dL (ref 0–99)
Triglycerides: 92 mg/dL (ref <150)
VLDL: 18 mg/dL (ref 0–40)

## 2017-05-24 LAB — TSH: TSH: 7.525 u[IU]/mL — AB (ref 0.350–4.500)

## 2017-05-24 LAB — TROPONIN I: Troponin I: 0.27 ng/mL (ref <0.03)

## 2017-05-24 MED ORDER — RESOURCE THICKENUP CLEAR PO POWD
ORAL | Status: DC | PRN
  Administered 2017-05-24: 17:00:00 via ORAL
  Filled 2017-05-24 (×3): qty 125

## 2017-05-24 MED ORDER — PERFLUTREN LIPID MICROSPHERE
1.0000 mL | INTRAVENOUS | Status: AC | PRN
  Filled 2017-05-24: qty 10

## 2017-05-24 MED ORDER — ENOXAPARIN SODIUM 100 MG/ML ~~LOC~~ SOLN: 1.0000 mg/kg | Freq: Two times a day (BID) | SUBCUTANEOUS | Status: DC

## 2017-05-24 MED ORDER — PERFLUTREN LIPID MICROSPHERE
INTRAVENOUS | Status: AC
  Administered 2017-05-24: 2 mL
  Filled 2017-05-24: qty 10

## 2017-05-24 MED ORDER — ENOXAPARIN SODIUM 60 MG/0.6ML ~~LOC~~ SOLN
50.0000 mg | SUBCUTANEOUS | Status: DC
  Administered 2017-05-24 – 2017-05-31 (×8): 50 mg via SUBCUTANEOUS
  Filled 2017-05-24 (×7): qty 0.6
  Filled 2017-05-24: qty 0.5
  Filled 2017-05-24: qty 0.6

## 2017-05-24 MED ORDER — POTASSIUM CHLORIDE 10 MEQ/100ML IV SOLN
10.0000 meq | INTRAVENOUS | Status: AC
  Administered 2017-05-24 (×4): 10 meq via INTRAVENOUS
  Filled 2017-05-24 (×2): qty 100

## 2017-05-24 NOTE — Progress Notes (Signed)
  Speech Language Pathology Treatment: Dysphagia  Patient Details Name: Clayton Green. MRN: [id number] DOB: 1951/07/22 Today's Date: 05/24/2017 Time: 1191-4782 SLP Time Calculation (min) (ACUTE ONLY): 9 min  Assessment / Plan / Recommendation Clinical Impression  Although sips of thin liquids still elicit a strong, prolonged cough response, he has no overt s/s of aspiration with ice chips and purees. Left-sided pocketing is minimal with bites of puree, indicating improvement from assessment on previous date. Recommend to proceed with MBS to better assess oropharyngeal function. Will plan for this afternoon.   HPI HPI: 66 year old man with a past medical history of diabetes hypertension hyperlipidemia depression hypothyroidism venous stasis ulcers and chronic diastolic heart failure brought in by [facility] for evaluation of left-sided weakness. CCT subacute/acute infarction right MCA territory affecting insula and frontoparietal region.  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        SLP Plan  MBS       Recommendations  Diet recommendations: NPO Medication Administration: Via alternative means                Oral Care Recommendations: Oral care QID Follow up Recommendations: Inpatient Rehab;Skilled Nursing facility SLP Visit Diagnosis: Dysphagia, unspecified (R13.10) Plan: MBS       GO                Maxcine Ham 05/24/2017, 11:02 AM  Maxcine Ham, M.A. CCC-SLP [phone]

## 2017-05-24 NOTE — Progress Notes (Addendum)
Attempted to contact patient's Emergency contact, James Faulk without success 3 times - Voicemail is not Geralyn Flashing and voice of a male.   Contacted Mr. Gladis Riffle who is TMS lead assisting patient through [facility]; he directed me to contact his manager Mr. Francia Greaves and consider contacting Levi Aland at [employer] [phone]) who also assists patient. Ms. Roby Lofts voicemail was full and there was no answer.   Mr. Stephanie Coup was actually in the hospital as he had just heard patient was admitted. He does not know of any emergency contacts or family, but will go through Mr. Daigle paperwork and let us know. He states that he last saw patient late last week and had just helped him move back in from a SNF; he states that his [facility] RN had recommended to his physician to consider permanent SNF placement due to ongoing decline which was also documented by his physician and patient had been upset by this.   Mr. Stephanie Coup states that Mr. Budney had no speech difficulties prior to this hospitalization.   Our team will keep in contact with Mr. Stephanie Coup in hopes of finding appropriate decision maker.   Nyra Market, MD IMTS - PGY2 Pager [phone]

## 2017-05-24 NOTE — Progress Notes (Signed)
STROKE TEAM PROGRESS NOTE   SUBJECTIVE (INTERVAL HISTORY) His speech therapist is at the bedside.  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 Right gaze and left hemiplegia. Did not pass swallow today. On IVF. Found to have UTI and on rocephin.    OBJECTIVE Temp:  [97.6 F (36.4 C)-101.6 F (38.7 C)] 97.6 F (36.4 C) (10/08 0810) Pulse Rate:  [62-122] 62 (10/08 0810) Cardiac Rhythm: Normal sinus rhythm (10/08 0700) Resp:  [16-31] 16 (10/08 0810) BP: (102-128)/(60-75) 126/66 (10/08 0810) SpO2:  [90 %-100 %] 97 % (10/08 0810) Weight:  [222 lb 6.4 oz (100.9 kg)] 222 lb 6.4 oz (100.9 kg) (10/07 1615)   Recent Labs Lab 05/23/17 1632 05/23/17 1924 05/23/17 2348 05/24/17 0438 05/24/17 0813  GLUCAP 125* 120* 118* 124* 127*    Recent Labs Lab 05/20/17 1052 05/23/17 0737 05/23/17 0756 05/24/17 0351  NA 137 138 137 142  K 3.5 3.1* 3.6 3.2*  CL 98 99* 99* 105  CO2 29  --  24 23  GLUCOSE 124* 109* 134* 124*  BUN 33* 47* 49* 39*  CREATININE 1.69* 2.00* 2.09* 1.68*  CALCIUM 8.4*  --  8.7* 8.2*    Recent Labs Lab 05/23/17 0756 05/24/17 0351  AST 39 33  ALT 19 15*  ALKPHOS 214* 170*  BILITOT 1.0 1.2  PROT 7.3 6.0*  ALBUMIN 3.3* 2.6*    Recent Labs Lab 05/20/17 1052 05/23/17 0737 05/23/17 0756 05/24/17 0351  WBC 11.2*  --  11.7* 8.3  NEUTROABS 9,453*  --  9.1*  --   HGB 11.6* 12.9* 12.4* 10.3*  HCT 34.5* 38.0* 39.4 33.2*  MCV 89.4  --  93.8 93.0  PLT 191  --  159 144*    Recent Labs Lab 05/23/17 0756 05/23/17 1410 05/23/17 2136 05/24/17 0738  TROPONINI 0.21* 0.20* 0.25* 0.27*    Recent Labs  05/23/17 0756  LABPROT 14.6  INR 1.15    Recent Labs  05/23/17 1018  COLORURINE YELLOW  LABSPEC 1.041*  PHURINE 5.0  GLUCOSEU NEGATIVE  HGBUR SMALL*  BILIRUBINUR NEGATIVE  KETONESUR 5*  PROTEINUR 30*  NITRITE NEGATIVE  LEUKOCYTESUR LARGE*       Component Value Date/Time   CHOL 110 05/24/2017 0351   TRIG 92 05/24/2017 0351    HDL 49 05/24/2017 0351   CHOLHDL 2.2 05/24/2017 0351   VLDL 18 05/24/2017 0351   LDLCALC 43 05/24/2017 0351   Lab Results  Component Value Date   HGBA1C 5.9 (H) 05/24/2017   No results found for: LABOPIA, COCAINSCRNUR, LABBENZ, AMPHETMU, THCU, LABBARB  No results for input(s): ETH in the last 168 hours.  I have personally reviewed the radiological images below and agree with the radiology interpretations.  CTA Head and neck and CTP W Or Wo Contrast 05/23/2017 IMPRESSION: Missing M2 to M3 branch on the right without evidence of a retrievable proximal thrombus. This corresponds to the region of infarction demonstrated and noncontrast CT and for fusion. No upstream vascular lesion seen to predispose to stroke. Minimal atherosclerotic changes without stenosis or irregularity. Perfusion imaging shows complete infarction of 18 cc in the insular and posterior frontal region. Surrounding region of diminished for fusion is calculated at 66 cc, but this is probably exaggerated based on inclusion of some left ACA territory brain, which I think is probably normal based on the vascular imaging.   Mr Brain Wo Contrast 05/23/2017 IMPRESSION: 1. Multifocal nonhemorrhagic infarcts involving anterior and posterior circulation bilaterally. This suggests a central embolic source. 2.  The largest area of infarction involves the right MCA territory, specifically the right frontal operculum and insular cortex. 3. Confluent area of acute nonhemorrhagic infarct involving the posterior left parietal lobe and occipital lobe. 4. Additional punctate foci of acute infarction throughout the posterior circulation bilaterally. 5. Remote encephalomalacia involving the posterior left temporal and occipital lobe.   Ct Head Code Stroke Wo Contrast 05/23/2017 IMPRESSION: 1. Acute/subacute infarction in the right middle cerebral artery territory with mild swelling but no hemorrhage. I would estimate that this is greater than 8 hours  old given these findings. Region of infarction measures approximately 6 cm. 2. ASPECTS is 6   2D Echocardiogram   - Technically difficult; definity used; normal LV systolic   function; mild diastolic dysfunction; calcified aortic valve with   mild AS (mean gradient 11 mmHg); mild LAE; trace TR with moderate   elevation of pulmonary pressure.  LE venous doppler pending  TEE pending   PHYSICAL EXAM  Temp:  [97.6 F (36.4 C)-101.6 F (38.7 C)] 97.6 F (36.4 C) (10/08 0810) Pulse Rate:  [62-122] 62 (10/08 0810) Resp:  [16-31] 16 (10/08 0810) BP: (102-128)/(60-75) 126/66 (10/08 0810) SpO2:  [90 %-100 %] 97 % (10/08 0810) Weight:  [222 lb 6.4 oz (100.9 kg)] 222 lb 6.4 oz (100.9 kg) (10/07 1615)  General - Obese, well developed, in no apparent distress.  Ophthalmologic - fundi not visualized due to noncooperation.  Cardiovascular - Regular rate and rhythm with no murmur.  Mental Status -  Level of arousal and orientation to time, and person were intact, however, not orientated to place. Language exam showed partial global aphasia, able to answer some questions appropriately, follow limited verbal commands, able to speak words, but not sentences, severe perseveration. Moderate dysarthria.  Cranial Nerves II - XII - II - left visual field neglect vs. Left hemianopia. III, IV, VI - Extraocular movements exam right gaze preference and not able to cross midline. V - Facial sensation intact bilaterally. VII - left facial droop. VIII - Hearing & vestibular intact bilaterally. X - Palate elevates symmetrically, moderate dysarthria. XI - Chin turning & shoulder shrug intact bilaterally. XII - Tongue protrusion intact.  Motor Strength - The patient's strength was 4/5 RUE and 3/5 RLE on pain, however, 0/5 LUE and slight withdraw LLE on pain stimulation.  Bulk was normal and fasciculations were absent.   Motor Tone - Muscle tone was assessed at the neck and appendages and was  normal.  Reflexes - The patient's reflexes were 1+ in all extremities and he had babinski positive on the left.  Sensory - Light touch, temperature/pinprick were assessed and were symmetrical.    Coordination - The patient had normal movements in the right hand.  Tremor was absent.  Gait and Station - not tested   ASSESSMENT/PLAN [name]. is a 66 y.o. male with history of DM, HTN, HLD, hypothyroidism, bilateral lower extremity venous stasis, CHF admitted for slurry speech, left-sided weakness and urinary incontinence. Patient has recent fall with scalp hematoma.     Stroke:  Multifocal infarcts bilateral anterior and posterior circulation, consistent with cardioembolic infarct. Etiology unclear.   MRI  multifocal infarcts bilateral anterior and posterior calculation, including left MCA, left MCA/PCA, right MCA, bilateral cerebellum, with the largest at right MCA. 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  CTA head and neck - questionable right M2/M3 missing branches, no LVO.  2D Echo  EF 55-60%, unremarkable  LE venous Doppler pending  Recommend  TEE and loop recorder to evaluate cardioembolic source. cardmaster aware.   LDL 43  HgbA1c 5.9  Heparin subcutaneous for VTE prophylaxis  Diet NPO time specified   aspirin 81 mg daily prior to admission, now on aspirin 325 mg daily  Patient counseled to be compliant with his antithrombotic medications  Ongoing aggressive stroke risk factor management  Therapy recommendations:  Pending  Disposition:  Pending  UTI  One-time Spiking fever with tachycardia, now resolved  On Rocephin  UA WBC TNTC  Urine culture pending  Blood culture pending  Diabetes  HgbA1c 5.9 goal < 7.0  Controlled  CBG monitoring  SSI  Hypertension Home meds Lasix and lisinopril Permissive hypertension (OK if <180/105) for 24-48 hours post stroke and then gradually normalized within 5-7  days.  Stable  Hyperlipidemia  Home meds:  Lipitor 20   LDL 43, goal < 70  Resume Lipitor 20 once PO access  Continue statin at discharge  Other Stroke Risk Factors  Advanced age  Obesity  CHF on lasix and lisinopril at home  Other Active Problems  Hypothyroidism with elevated TSH -on Synthroid   Other Pertinent History  Bilateral LE wounds with dressing due to chronic venous stasis  Hospital day # 1  This patient is medically critical due to significant cardioembolic stroke with severe neuro deficit. Needs further stroke workup at this time. Patient is at high risk for recurrent strokes and TIAs and neurological worsening. This patient's care requires constant monitoring of vital signs, hemodynamics, respiratory and cardiac monitoring, review of multiple databases, neurological assessment, discussion with family, other specialists and medical decision making of high complexity. I spent 35 min in taking care of this pt.    Marvel Plan, MD PhD Stroke Neurology 05/24/2017 11:27 AM    To contact Stroke Continuity provider, please refer to [email or web address]. After hours, contact General Neurology

## 2017-05-24 NOTE — Progress Notes (Signed)
  Echocardiogram 2D Echocardiogram with Definity has been performed.  Clayton Green 05/24/2017, 10:43 AM

## 2017-05-24 NOTE — Progress Notes (Signed)
Subjective: Patient seen and examined. Responsive to his name and alert and oriented only to person. Patient's condition remains similar to yesterday. He is able to follow simple commands intermittently.   Objective:  Vital signs in last 24 hours: Vitals:   05/23/17 2022 05/23/17 2344 05/24/17 0434 05/24/17 0810  BP:  105/60 112/64 126/66  Pulse: (!) 101 98 82 62  Resp: (!) 25 (!) 26 (!) 21 16  Temp:  99.4 F (37.4 C) 98.2 F (36.8 C) 97.6 F (36.4 C)  TempSrc:  Oral Oral Oral  SpO2: 90% 100% 98% 97%  Weight:      Height:       General: Laying in bed comfortably, NAD HEENT: [place], left infraorbital bruising, midline forehead abrasion, right sided gaze preference leftward saccades Cardiac: RRR, No R/M/G appreciated Pulm: normal effort, CTAB Abd: soft, non tender, non distended, BS normal Ext: extremities with chronic venous stasis changes, +1 peripheral edema Neuro: alert and oriented X1, right sided gaze preference leftward saccades, left sided facial droop, dysarthric speech, strength 3/5 on right upper and lower extremity, left upper and lower extremity 0/5, patient able to wiggle toes on left, patellar DTR 0 bilaterally, lower extremities contracted and rigid   Assessment/Plan:  Active Problems:   CVA (cerebral vascular accident) (HCC)   Right sided cerebral hemisphere cerebrovascular accident (CVA) (HCC)  Right MCA CVA Patient with acute onset of left sided weakness, left facial droop, dysarthria, right sided gaze preference of unknown duration so tPA was not adminstered. MRI showed multifocal nonhemorrhagic infarcts involving anterior and posterior circulation bilaterally, largest area of infarction involving R MCA territory. This suggests a central embolic source. ECHO with normal systolic function, EF 55-60%, wall motion normal, grade 1 diastolic dysfunction. Lipid panel WNL, Hgb A1C 5.9. Physical exam minimally improved from presentation. Patient remains hemodynamically  stable and is protecting his airway, sating well on RA.  -Neurology following appreciate recs -LE venous doppler to be done today  -Telemetry -Q2 hour neuro checks -ASA 300 mg per rectum daily  -NPO  -PT/OT   Elevated Troponin POC troponin 0.19. Troponin I 0.21>>0.25>>0.27. EKG shows no acute ischemic changes. HEART Score of 5 with risk factors of age, type 2 diabetes, and HLD. Difficult to clarify if patient is symptomatic. ECHO with no wall motion abnormalities and normal systolic function, EF 55-60%. Repeat EKG this AM showed normal sinus rhythm no evidence of ischemic changes.  -Will continue Cardiac monitoring   Fever Most likely multifactorial in the setting of acute nonhemorrhagic stroke and UTI. Patient Tmax 101.6. Over night remained afebrile with most recent temp 97.6. Preliminary urine culture + for gram negative rods. S/p one dose of Ceftriaxone. Blood culture are still pending. Leukocytosis improved from 11.7 to 8.3.  -f/u blood cultures, final urine culture -Ceftriaxone 1 g q24 hours -Rectal Acetaminophen q6 hours PRN for fever  -CBC in AM  AKI Elevated creatinine 2.00>>2.09 on admission. Cr 1.68 today, returning to patient's baseline.  Most likely pre-renal due to dehydration. Per chart review the patient has a documented history of CKD III. In 06/2016 creatinine level was within normal limits ranging between 0.75 and 1. In 02/2017 patient's creatinine increased to 1.87 after acute rhabdomyolsis and dehydration s/p fall. Since that time the patients creatinine has ranged between 1.1-1.7.  -IVF resuscitation @ 100 cc/hr  -Repeat BMET in AM  Hypokalemia  3.1 on arrival to ED>>improved to 3.6>> 3.2 this AM -Replete with 40 meq IV potassium -BMET in AM  Type 2  DM A1C 5.9, well controlled. Complicated by neuropathy.  -On metformin 500 mg BID at home -Insulin SS TID with meals  -Patient currently NPO  HTN, HLD BP have been soft ranging between 95-120 systolic and  54-70 diastolic.  -Home regimen includes Lisinopril 10 mg, ASA 81 mg, Lipitor 20 mg -Holding HTN medications and statin, patient NPO   Hypothryoidsim Post surgical hypothyroidism. TSH elevated at 7.525 on Levothyroxine 88 mcg BID at home. Unable to obtain history from patient so do not know about medication adherence.  -Continue IV Levothyroxine 88 mcg   Chronic venous insufficiency Patient with +1 lower extremity edema and small venous statis ulcer on left lower extremity.  -Consulted wound care >> appreciate recommendations  -Takes Lasix 20 mg PRN at home   F: 100 cc/hr NS E: 40 meq IV K+, Repletion as needed N: NPO DVT prophylaxis: SQ heparin   Dispo: Anticipated discharge in approximately 3-4 days.   Toney Rakes, MD 05/24/2017, 10:59 AM Pager: [phone]

## 2017-05-24 NOTE — Evaluation (Signed)
Speech Language Pathology Evaluation Patient Details Name: Clayton Green. MRN: [id number] DOB: 1951-07-12 Today's Date: 05/24/2017 Time: 4098-1191 SLP Time Calculation (min) (ACUTE ONLY): 9 min  Problem List:  Patient Active Problem List   Diagnosis Date Noted  . CVA (cerebral vascular accident) (HCC) 05/23/2017  . Right sided cerebral hemisphere cerebrovascular accident (CVA) (HCC) 05/23/2017  . CKD (chronic kidney disease), stage III (HCC) 05/20/2017  . Insomnia 05/20/2017  . Cervical stenosis of spine 05/20/2017  . Hyperlipidemia associated with type 2 diabetes mellitus (HCC) 09/11/2016  . Chronic low back pain 09/11/2016  . Degenerative joint disease (DJD) of lumbar spine 09/11/2016  . GERD (gastroesophageal reflux disease) 08/12/2016  . Chronic venous insufficiency 08/05/2016  . Elevated serum creatinine 08/05/2016  . Biliary colic 06/21/2016  . Cholelithiasis 06/21/2016  . 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 Vitamin D deficiency 03/28/2015  . 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 Essential hypertension 03/28/2015   Past Medical History:  Past Medical History:  Diagnosis Date  . Acquired hypothyroidism    a. s/p thyroidectomy.  . Arthritis   . Depression   . Diabetes mellitus without complication (HCC)   . GERD (gastroesophageal reflux disease)   . Hyperlipidemia   . Hypertension   . Vitamin D deficiency    Past Surgical History:  Past Surgical History:  Procedure Laterality Date  . colonscopy    . THYROIDECTOMY, PARTIAL     HPI:  66 year old man with a past medical history of diabetes hypertension hyperlipidemia depression hypothyroidism venous stasis ulcers and chronic diastolic heart failure brought in by [facility] for evaluation of left-sided weakness. CCT subacute/acute infarction right MCA territory affecting insula and frontoparietal  region.  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     Assessment / Plan / Recommendation Clinical Impression  Pt has a right-sided gaze preference but can look to the L with Mod cues, although he does not sustain this. His awareness of his physical impairments is reduced. His speech is moderately dysarthric secondary to left-sided weakness from CN VII invovlement. Pt has an expressive > receptive aphasia that is suspected to be a baseline impairment given R MCA dominant infarct and chronic L temporal lobe infarct found on MRI. Pt will need additional SLP f/u to maximize functional communication and independence.    SLP Assessment  SLP Recommendation/Assessment: Patient needs continued Speech Lanaguage Pathology Services SLP Visit Diagnosis: Dysarthria and anarthria (R47.1);Cognitive communication deficit (R41.841)    Follow Up Recommendations  Inpatient Rehab;Skilled Nursing facility    Frequency and Duration min 2x/week  2 weeks      SLP Evaluation Cognition  Overall Cognitive Status: Difficult to assess (aphasia, no family present to know baseline) Arousal/Alertness: Awake/alert Orientation Level: Oriented to person;Oriented to time;Oriented to situation Awareness: Impaired Awareness Impairment: Intellectual impairment;Emergent impairment;Anticipatory impairment Problem Solving: Impaired Problem Solving Impairment: Functional basic Safety/Judgment: Impaired       Comprehension  Auditory Comprehension Overall Auditory Comprehension: Impaired Yes/No Questions: Impaired Basic Biographical Questions: 26-50% accurate Basic Immediate Environment Questions: 25-49% accurate Commands: Impaired One Step Basic Commands: 50-74% accurate Conversation: Simple Interfering Components: Processing speed (delayed) EffectiveTechniques: Extra processing time    Expression Expression Primary Mode of Expression: Verbal Verbal Expression Overall Verbal Expression: Impaired Initiation: No  impairment Automatic Speech: Name;Social Response Level of Generative/Spontaneous Verbalization: Phrase;Word Non-Verbal Means of Communication: Not applicable   Oral / Motor  Oral Motor/Sensory Function Overall Oral Motor/Sensory Function:  Moderate impairment Facial ROM: Reduced left;Suspected CN VII (facial) dysfunction Facial Symmetry: Abnormal symmetry left;Suspected CN VII (facial) dysfunction Motor Speech Overall Motor Speech: Impaired Respiration: Within functional limits Phonation: Normal Resonance: Within functional limits Articulation: Impaired Level of Impairment: Phrase Intelligibility: Intelligibility reduced Phrase: 50-74% accurate (without context; improved with context) Motor Planning: Witnin functional limits   GO                    Maxcine Ham 05/24/2017, 11:21 AM  Maxcine Ham, M.A. CCC-SLP [phone]

## 2017-05-24 NOTE — Progress Notes (Signed)
VASCULAR LAB PRELIMINARY  PRELIMINARY  PRELIMINARY  PRELIMINARY  Bilateral lower extremity venous duplex completed.    Preliminary report:  Severely technically limited bilaterally due to open wounds, pain, and inability to position the legs. In there areas fully image there is no apparent DVT however exam is inconclusive. There is a superficial thrombosis of the distal left greater saphenous vein.  Alysse Rathe, RVS 05/24/2017, 3:55 PM

## 2017-05-24 NOTE — Progress Notes (Signed)
Tried to call patient and no answer.  I mailed letter with all of Dr. Althea Charon new directions.  05/24/17

## 2017-05-24 NOTE — Progress Notes (Signed)
Modified Barium Swallow Progress Note  Patient Details  Name: Clayton Green. MRN: [id number] Date of Birth: 1951-04-30  Today's Date: 05/24/2017  Modified Barium Swallow completed.  Full report located under Chart Review in the Imaging Section.  Brief recommendations include the following:  Clinical Impression  Pt has a moderate oropharyngeal dysphagia that is largely impacted by impaired CN VII and XII. He has left-sided pocketing with larger bites of puree as well as reduced bolus cohesion across consistencies. Nectar and honey thick liquids both spill posteriorly from the oral cavity directly into the laryngeal vestibule prior to swallow trigger. Aspiration of moderate amount of nectar thick liquids did elicit a cough response, but not sufficient to clear his airway. He needed multiple cued coughs to clear penetration of honey thick liquids even in small quantities. Recommend to initiate purees only (pudding thick liquids) to start PO intake, increase use of the swallowing musculature, and reduce risk of aspiration. Full supervision will help in monitoring for bolus size and oral clearance.   Swallow Evaluation Recommendations       SLP Diet Recommendations: Dysphagia 1 (Puree) solids;Pudding thick liquid   Liquid Administration via: Spoon   Medication Administration: Crushed with puree   Supervision: Staff to assist with self feeding;Full supervision/cueing for compensatory strategies   Compensations: Minimize environmental distractions;Slow rate;Small sips/bites;Lingual sweep for clearance of pocketing   Postural Changes: Seated upright at 90 degrees   Oral Care Recommendations: Oral care BID   Other Recommendations: Have oral suction available    Maxcine Ham 05/24/2017,1:57 PM   Maxcine Ham, M.A. CCC-SLP [phone]

## 2017-05-24 NOTE — Progress Notes (Signed)
PT Cancellation Note  Patient Details Name: Clayton Green. MRN: [id number] DOB: [birth year]   Cancelled Treatment:    Reason Eval/Treat Not Completed: Other (comment).  Pt currently on bed rest.  Teaching service paged to see if bed rest is still appropriate.  PT will check back later as time allows.   Thanks,    Rollene Rotunda. Codie Hainer, PT, DPT [phone]   05/24/2017, 9:32 AM

## 2017-05-24 NOTE — Progress Notes (Signed)
Pts last troponin  still elevated at 0.27.  Paged attending MD

## 2017-05-24 NOTE — Consult Note (Signed)
WOC nurse attempted to see patient, patient off the floor.  Will attempt to evaluate patient tomorrow. WOC nurse to leave for another campus today. Ahlaya Ende [facility], CNS, [employer] [phone]

## 2017-05-25 DIAGNOSIS — N3001 Acute cystitis with hematuria: Secondary | ICD-10-CM

## 2017-05-25 DIAGNOSIS — L8990 Pressure ulcer of unspecified site, unspecified stage: Secondary | ICD-10-CM | POA: Insufficient documentation

## 2017-05-25 DIAGNOSIS — N179 Acute kidney failure, unspecified: Secondary | ICD-10-CM

## 2017-05-25 DIAGNOSIS — E1165 Type 2 diabetes mellitus with hyperglycemia: Secondary | ICD-10-CM

## 2017-05-25 DIAGNOSIS — I63411 Cerebral infarction due to embolism of right middle cerebral artery: Secondary | ICD-10-CM

## 2017-05-25 LAB — GLUCOSE, CAPILLARY
GLUCOSE-CAPILLARY: 121 mg/dL — AB (ref 65–99)
GLUCOSE-CAPILLARY: 148 mg/dL — AB (ref 65–99)
GLUCOSE-CAPILLARY: 119 mg/dL — AB (ref 65–99)
GLUCOSE-CAPILLARY: 165 mg/dL — AB (ref 65–99)
GLUCOSE-CAPILLARY: 141 mg/dL — AB (ref 65–99)
Glucose-Capillary: 115 mg/dL — ABNORMAL HIGH (ref 65–99)

## 2017-05-25 LAB — BASIC METABOLIC PANEL
ANION GAP: 9 (ref 5–15)
BUN: 29 mg/dL — ABNORMAL HIGH (ref 6–20)
CALCIUM: 8.3 mg/dL — AB (ref 8.9–10.3)
CO2: 24 mmol/L (ref 22–32)
Chloride: 108 mmol/L (ref 101–111)
Creatinine, Ser: 1.17 mg/dL (ref 0.61–1.24)
Glucose, Bld: 141 mg/dL — ABNORMAL HIGH (ref 65–99)
POTASSIUM: 3.7 mmol/L (ref 3.5–5.1)
SODIUM: 141 mmol/L (ref 135–145)

## 2017-05-25 LAB — CBC
HEMATOCRIT: 33.4 % — AB (ref 39.0–52.0)
HEMOGLOBIN: 10.2 g/dL — AB (ref 13.0–17.0)
MCH: 28.9 pg (ref 26.0–34.0)
MCHC: 30.5 g/dL (ref 30.0–36.0)
MCV: 94.6 fL (ref 78.0–100.0)
Platelets: 156 10*3/uL (ref 150–400)
RBC: 3.53 MIL/uL — ABNORMAL LOW (ref 4.22–5.81)
RDW: 18.3 % — ABNORMAL HIGH (ref 11.5–15.5)
WBC: 8.5 10*3/uL (ref 4.0–10.5)

## 2017-05-25 LAB — URINE CULTURE: Culture: >=100000 — AB

## 2017-05-25 LAB — MAGNESIUM: MAGNESIUM: 1.8 mg/dL (ref 1.7–2.4)

## 2017-05-25 MED ORDER — HYDROCERIN EX CREA
TOPICAL_CREAM | Freq: Two times a day (BID) | CUTANEOUS | Status: DC
  Administered 2017-05-25 – 2017-05-27 (×6): via TOPICAL
  Administered 2017-05-28: 1 via TOPICAL
  Administered 2017-05-28 – 2017-05-29 (×3): via TOPICAL
  Administered 2017-05-30: 1 via TOPICAL
  Administered 2017-05-30 – 2017-05-31 (×2): via TOPICAL
  Administered 2017-05-31: 1 via TOPICAL
  Administered 2017-06-01: 10:00:00 via TOPICAL
  Filled 2017-05-25: qty 113

## 2017-05-25 MED ORDER — ATORVASTATIN CALCIUM 10 MG PO TABS
20.0000 mg | ORAL_TABLET | Freq: Every day | ORAL | Status: DC
  Administered 2017-05-25 – 2017-06-01 (×7): 20 mg via ORAL
  Filled 2017-05-25 (×7): qty 1

## 2017-05-25 MED ORDER — PAROXETINE HCL 20 MG PO TABS
30.0000 mg | ORAL_TABLET | Freq: Every day | ORAL | Status: DC
  Administered 2017-05-25 – 2017-06-01 (×7): 30 mg via ORAL
  Filled 2017-05-25 (×7): qty 2

## 2017-05-25 MED ORDER — LEVOTHYROXINE SODIUM 88 MCG PO TABS
176.0000 ug | ORAL_TABLET | Freq: Every day | ORAL | Status: DC
  Administered 2017-05-25 – 2017-06-01 (×8): 176 ug via ORAL
  Filled 2017-05-25 (×8): qty 2

## 2017-05-25 MED ORDER — SODIUM CHLORIDE 0.9 % IV SOLN
1.0000 g | Freq: Three times a day (TID) | INTRAVENOUS | Status: DC
  Administered 2017-05-25 – 2017-05-27 (×6): 1 g via INTRAVENOUS
  Filled 2017-05-25 (×7): qty 1

## 2017-05-25 MED ORDER — ADULT MULTIVITAMIN W/MINERALS CH
1.0000 | ORAL_TABLET | Freq: Every day | ORAL | Status: DC
  Administered 2017-05-25 – 2017-06-01 (×7): 1 via ORAL
  Filled 2017-05-25 (×7): qty 1

## 2017-05-25 MED ORDER — LEVOTHYROXINE SODIUM 88 MCG PO TABS: 176.0000 ug | ORAL_TABLET | Freq: Every day | ORAL | Status: DC

## 2017-05-25 NOTE — Progress Notes (Signed)
Orthopedic Tech Progress Note Patient Details:  Clayton Green Apr 14, 1951 [id number]      [name] 05/25/2017, 1:38 PM Called in bio-tech brace order; spoke with [facility]

## 2017-05-25 NOTE — Progress Notes (Signed)
Occupational Therapy Progress Note  Splint placed on Lt hand by [name].  OT checked splint after, with pt indicating pain.  However, very difficult to accurately determine if splint is source of pain or if IV in Rt UE source of pain as pt states Lt, but picks up Rt UE.   Spasticity significantly improved.  PROM performed.  Splint removed and nsg instructed to not apply it this pm, and OT will apply tomorrow and monitor throughout the day.       05/25/17 1633  OT Visit Information  Last OT Received On 05/25/17  Assistance Needed +3 or more  History of Present Illness This 66 y.o. male admitted with Lt sided weakness, and communication deficits.  MRI of head showed:  Multifocal nonhemorrhagic infarcts involving anterior and  Precautions  Precautions Fall  Cognition  Arousal/Alertness Awake/alert  Behavior During Therapy Flat affect  Overall Cognitive Status Impaired/Different from baseline  Area of Impairment Attention;Following commands;Problem solving  Current Attention Level Focused  Following Commands Follows one step commands inconsistently;Follows one step commands with increased time  Problem Solving Slow processing;Decreased initiation;Difficulty sequencing;Requires verbal cues;Requires tactile cues  General Comments Pt demonstrates significant deficits with initiation and with motor planning as well as communication deficits making it difficult to accurately assess cognition   Exercises  Exercises Other exercises  Other Exercises  Other Exercises Resting hand splint delivered and on pt upon OT arrival.  Pt states "pain"  when asked how he is, and when asked where says "my left".   When Lt arm moved into his line of sight, and he was asked to point the area that hurts, he lifts his Rt arm.   Splint was removed and PROM performed Lt UE.  spasticity significantly improved.   all joints.  While performing PROM Lt shoulder pt screamed "pain!" "sharp pain".  Scapular mobs depression,  adduction, abduction, and downward rotation performed and scap assisted into upward rotation with PROM, with pt making no further complaints.  After PROM, splint was placed back on Lt UE, and within 3 mins. pt stated "pain"  "pain left", but continues to hold up Rt UE when asked specifically where.   Splint removed and discussed with RN.  Will keep splint off tonight, and reasess tomorrow.  RN to assess IV in Rt UE.   No signs of redness noted when splint was initially removed   OT - End of Session  Equipment Utilized During Treatment Oxygen  Activity Tolerance Patient tolerated treatment well  Patient left in bed;with call bell/phone within reach;with bed alarm set;with nursing/sitter in room  Nurse Communication Other (comment) (splint status )  OT Assessment/Plan  OT Plan Discharge plan remains appropriate  OT Visit Diagnosis Hemiplegia and hemiparesis;Cognitive communication deficit (R41.841)  Symptoms and signs involving cognitive functions Cerebral infarction  Hemiplegia - Right/Left Left  Hemiplegia - dominant/non-dominant Non-Dominant  Hemiplegia - caused by Cerebral infarction  OT Frequency (ACUTE ONLY) Min 2X/week  Follow Up Recommendations SNF  OT Equipment None recommended by OT  AM-PAC OT "6 Clicks" Daily Activity Outcome Measure  Help from another person eating meals? 1  Help from another person taking care of personal grooming? 1  Help from another person toileting, which includes using toliet, bedpan, or urinal? 1  Help from another person bathing (including washing, rinsing, drying)? 1  Help from another person to put on and taking off regular upper body clothing? 1  Help from another person to put on and taking off regular lower body clothing?  1  6 Click Score 6  ADL G Code Conversion CN  OT Time Calculation  OT Start Time (ACUTE ONLY) 1517  OT Stop Time (ACUTE ONLY) 1558  OT Time Calculation (min) 41 min  OT General Charges  $OT Visit 1 Visit  OT Treatments   $Neuromuscular Re-education 23-37 mins  $Orthotics Fit/Training 8-22 mins  [employer], OTR/L [phone]

## 2017-05-25 NOTE — Progress Notes (Addendum)
Subjective: Patient seen and examined. Responsive to his name and alert and oriented to person, place [facility]), and year. He was able to state his age today. More responsive and communicative today. Continues to follow simple commands intermittently. His responses to questions are occasionally appropriate, but he is answering "yes" to most questions. He denied any pain this AM and continues to express that he is thirsty.   Objective:  Vital signs in last 24 hours: Vitals:   05/24/17 2000 05/25/17 0000 05/25/17 0400 05/25/17 0747  BP: 131/73 (!) 117/103 122/74 130/77  Pulse: 83 84 78 81  Resp: (!) 25 (!) 24 (!) 31 (!) 21  Temp:  98.8 F (37.1 C) 98.9 F (37.2 C) 97.8 F (36.6 C)  TempSrc:  Axillary Axillary Axillary  SpO2: 97% 98% 95% 94%  Weight:      Height:       General: Laying in bed , NAD HEENT: [place], left infraorbital bruising, midline forehead abrasion, right sided gaze preference leftward saccades, cannot track Cardiac: RRR, No R/M/G appreciated Pulm: normal effort, CTAB  Abd: soft, non tender, non distended, BS normal Ext: extremities with chronic venous stasis changes, wrapped with clean and dry bandages, +1 peripheral edema Neuro: alert and oriented X3 (person, [place], 2018), right sided gaze preference leftward saccades, left sided facial droop, dysarthric speech, strength 4/5 on right upper and 3/5 right lower extremity, left upper and lower extremity 0/5, patient able to wiggle toes on left, lower extremities remain contracted and rigid, upward Babinski on left   Assessment/Plan:  Active Problems:   Acute ischemic stroke (HCC)   Right sided cerebral hemisphere cerebrovascular accident (CVA) (HCC)   AKI (acute kidney injury) (HCC)   Acute cystitis with hematuria   Controlled type 2 diabetes mellitus with hyperglycemia, without long-term current use of insulin (HCC)  Right MCA CVA Patient with acute onset of left sided weakness, left facial droop,  dysarthria, right sided gaze preference of unknown duration so tPA was not adminstered. MRI showed multifocal nonhemorrhagic infarcts involving anterior and posterior circulation bilaterally, largest area of infarction involving R MCA territory, suggesting a central embolic source. ECHO with normal systolic function, EF 55-60%, wall motion normal, grade 1 diastolic dysfunction. LE dopplers limited bilaterally, in areas fully imaged there is no apparent DVT however exam inconclusive, there is a superficial thrombosis of the distal left greater saphenous vein. Physical exam minimally improved from presentation, but patient is more alert and interactive. Patient remains hemodynamically stable and is protecting his airway, sating well on RA.  -Neurology following appreciate recs -TEE pending  -Telemetry -Q2 hour neuro checks -ASA 325 mg crushed with food or 300 mg per rectum daily, Lipitor 20 mg  -PT/OT, Speech therapy, Nutritional management following; appreciate recs  UTI Urine culture + for ESBL resistant E. Coli.  -Disontinue Ceftriaxone 1 g q24 hours -Start Meropenem  -Day 2 of abx   Fever Has resolved and the patient has been afebrile for 24 hours, most recent temp 97.6. Blood NGTD. Leukocytosis resolved.  -Rectal Acetaminophen q6 hours PRN for fever   AKI Resolved. Creatine 1.17 today.  -IVF resuscitation @ 100 cc/hr  -Repeat BMET in AM  Hypokalemia  Resolved s/p 40 meq IV potassium. Serum potassium 3.7.  -BMET in AM  Elevated Troponin POC troponin 0.19. Troponin I 0.21>>0.25>>0.27. EKG shows no acute ischemic changes. HEART Score of 5 with risk factors of age, type 2 diabetes, and HLD. Difficult to clarify if patient is symptomatic. ECHO with no wall motion  abnormalities and normal systolic function, EF 55-60%. Repeat EKG this AM showed normal sinus rhythm no evidence of ischemic changes.  -Will continue Cardiac monitoring   Type 2 DM A1C 5.9, well controlled. Complicated by  neuropathy.  -On metformin 500 mg BID at home -Insulin SS TID with meals  -Patient currently NPO  HTN, HLD BP have been soft ranging between 95-120 systolic and 54-70 diastolic. Allowing for permissive HTN.  -Home regimen includes Lisinopril 10 mg, ASA 81 mg, Lipitor 20 mg -Holding HTN medications and statin, patient NPO   Hypothryoidsim Post surgical hypothyroidism. TSH elevated at 7.525.  -Synthroid 176 mcg QD crushed with pudding   Chronic venous insufficiency Patient with +1 lower extremity edema and bilateral venous ulcerations. 2 venous ulcerations on the LLE, small open area on the dorsal right foot. Both ulcerations on the LLE are clean, bloody and oozing, but not purulent. Right dorsal foot 100% yellow but not purulent.  -Wound care recommendations relayed to nursing; will re-consult if needed   F: 100 cc/hr NS E: 40 meq IV K+, Repletion as needed N: Dys 1 PuddingThick  DVT prophylaxis: SQ heparin   Dispo: Anticipated discharge in approximately 3-4 days.   Toney Rakes, MD 05/25/2017, 10:23 AM Pager: [phone]

## 2017-05-25 NOTE — Evaluation (Signed)
Occupational Therapy Evaluation Patient Details Name: Clayton Green. MRN: [id number] DOB: 10-Feb-1951 Today's Date: 05/25/2017    History of Present Illness This 66 y.o. male admitted with Lt sided weakness, and communication deficits.  MRI of head showed:  Multifocal nonhemorrhagic infarcts involving anterior and   Clinical Impression   Pt admitted with above. He demonstrates the below listed deficits and will benefit from continued OT to maximize safety and independence with BADLs.  Pt presents to OT with Lt hemiplegia with extensor spasticity, poor trunk control, impaired cognition, communication deficits noted as well as motor planning deficits, and Lt neglect/inattention.  Pt currently requires total A +2 for all aspects of bed mobility and EOB sitting.  Pt demonstrates pusher syndrome and pushes heavily posteriorly and to the Lt.  He is unable to initiate righting responses.   Currently he requires total A for ADLs.  PTA, pt was living alone and was mod I with ADLs at w/c level.  He will require SNF level rehab at discharge, and likely transition to LTC.  Will follow acutely.       Follow Up Recommendations  SNF    Equipment Recommendations  None recommended by OT    Recommendations for Other Services       Precautions / Restrictions Precautions Precautions: Fall Restrictions Weight Bearing Restrictions: No      Mobility Bed Mobility Overal bed mobility: Needs Assistance Bed Mobility: Rolling;Sidelying to Sit;Sit to Supine Rolling: Total assist;+2 for physical assistance Sidelying to sit: Total assist;+2 for physical assistance   Sit to supine: Total assist;+2 for physical assistance   General bed mobility comments: Pt with extensor spasticity.  requires max facilitation to flex trunk, hips, knees, elbows.  Requires assist for all aspects of bed mobility.  Pt attempts to Push himeslf into sitting, but demonstrates pusher syndrome  Transfers                  General transfer comment: unable to safely attempt     Balance Overall balance assessment: Needs assistance Sitting-balance support: Feet supported;Single extremity supported Sitting balance-Leahy Scale: Zero Sitting balance - Comments: Pt with heavy extensor and Lt pushing.  he was unable to flex Lt knee while EOB despite facilitation to inhibit extensor spasticity.   Pt demonstrates pusher syndrome, and requires max assist/facilitation to prevent pushing.  Pt pushes heavily posteriorly and to the Lt.  He is unable to initiate forward translation of trunk or trunk flexion despite max assist and inhibition of spasticity.                                     ADL either performed or assessed with clinical judgement   ADL Overall ADL's : Needs assistance/impaired Eating/Feeding: Total assistance;Bed level   Grooming: Wash/dry hands;Wash/dry face;Oral care;Brushing hair;Total assistance;Bed level   Upper Body Bathing: Total assistance;Bed level   Lower Body Bathing: Total assistance;Bed level   Upper Body Dressing : Total assistance;Bed level   Lower Body Dressing: Total assistance;Bed level   Toilet Transfer: Total assistance   Toileting- Clothing Manipulation and Hygiene: Total assistance;Bed level       Functional mobility during ADLs: Total assistance;+2 for physical assistance General ADL Comments: Pt with difficulty initiating tasks, and with attentional deficits      Vision         Perception Perception Comments: Pt with Rt gaze preference.  He will track therapist Lt to  Rt.     Praxis Praxis Praxis tested?: Deficits Deficits: Ideomotor;Initiation;Perseveration;Limb apraxia    Pertinent Vitals/Pain Pain Assessment: Faces Faces Pain Scale: No hurt     Hand Dominance Right (Pt reports Rt hand.  Chart indicates Lt hand )   Extremity/Trunk Assessment Upper Extremity Assessment Upper Extremity Assessment: Generalized weakness;LUE  deficits/detail LUE Deficits / Details: Pt with extensor spasticity of UE and flexor spasticity of Lt hand.   After inhibitory techniques performed able to achieve full PROM  LUE Sensation: decreased light touch;decreased proprioception LUE Coordination: decreased fine motor;decreased gross motor   Lower Extremity Assessment Lower Extremity Assessment: Defer to PT evaluation   Cervical / Trunk Assessment Cervical / Trunk Assessment: Other exceptions Cervical / Trunk Exceptions: Pt with impaired ability to isolate trunk movements - pt with ? extensor spasticity vs extensor pushing    Communication Communication Communication: Expressive difficulties   Cognition Arousal/Alertness: Awake/alert Behavior During Therapy: Flat affect Overall Cognitive Status: Impaired/Different from baseline Area of Impairment: Attention;Following commands;Problem solving                   Current Attention Level: Focused   Following Commands: Follows one step commands inconsistently;Follows one step commands with increased time     Problem Solving: Slow processing;Decreased initiation;Difficulty sequencing;Requires verbal cues;Requires tactile cues General Comments: Pt demonstrates significant deficits with initiation and with motor planning as well as communication deficits making it difficult to accurately assess cognition    General Comments  VSS     Exercises     Shoulder Instructions      Home Living Family/patient expects to be discharged to:: Skilled nursing facility                                 Additional Comments: Per pt and chart review pt lived alone in one level w/c accessible apt.         Prior Functioning/Environment Level of Independence: Needs assistance  Gait / Transfers Assistance Needed: Pt reports he ambulated with standard walker, however, chart review from last admission indicates pt was w/c bound, and was mod I for transfers to/from w/c.  After  last admission, pt did go to SNF and was recently discharged at what appears to be w/c level.   ADL's / Homemaking Assistance Needed: Per chart review, it appears pt was mod I with ADLs.  He reports to me he showers in seated position using a bench.  In July, he was sponge bathing per OT eval at that time (unsure of pt's accuracy    Comments: Per chart, pt received assist for IADLs and transportation.         OT Problem List: Decreased strength;Decreased range of motion;Decreased activity tolerance;Impaired balance (sitting and/or standing);Impaired vision/perception;Decreased coordination;Decreased cognition;Decreased safety awareness;Decreased knowledge of use of DME or AE;Impaired sensation;Impaired tone;Impaired UE functional use;Obesity      OT Treatment/Interventions: Self-care/ADL training;Neuromuscular education;DME and/or AE instruction;Manual therapy;Splinting;Therapeutic activities;Cognitive remediation/compensation;Visual/perceptual remediation/compensation;Patient/family education;Balance training    OT Goals(Current goals can be found in the care plan section) Acute Rehab OT Goals OT Goal Formulation: Patient unable to participate in goal setting Time For Goal Achievement: 06/08/17 Potential to Achieve Goals: Fair ADL Goals Pt Will Perform Eating: with mod assist;sitting Pt Will Perform Grooming: with mod assist;sitting Pt Will Perform Upper Body Bathing: with max assist;sitting Additional ADL Goal #1: Pt will sit EOB with max A +1 x 5 mins in prep for ADL activities  and functional transfers.  Additional ADL Goal #2: Pt will requires min cues to locate ADls items on his Lt  Additional ADL Goal #3: Pt will tolerate splint wear Lt UE   OT Frequency: Min 2X/week   Barriers to D/C: Decreased caregiver support          Co-evaluation PT/OT/SLP Co-Evaluation/Treatment: Yes Reason for Co-Treatment: Complexity of the patient's impairments (multi-system involvement);Necessary to  address cognition/behavior during functional activity;For patient/therapist safety;To address functional/ADL transfers   OT goals addressed during session: ADL's and self-care;Strengthening/ROM      AM-PAC PT "6 Clicks" Daily Activity     Outcome Measure Help from another person eating meals?: Total Help from another person taking care of personal grooming?: Total Help from another person toileting, which includes using toliet, bedpan, or urinal?: Total Help from another person bathing (including washing, rinsing, drying)?: Total Help from another person to put on and taking off regular upper body clothing?: Total Help from another person to put on and taking off regular lower body clothing?: Total 6 Click Score: 6   End of Session Equipment Utilized During Treatment: Oxygen Nurse Communication: Mobility status  Activity Tolerance: Patient tolerated treatment well Patient left: in bed;with call bell/phone within reach;with bed alarm set  OT Visit Diagnosis: Hemiplegia and hemiparesis;Cognitive communication deficit (R41.841) Symptoms and signs involving cognitive functions: Cerebral infarction Hemiplegia - Right/Left: Left Hemiplegia - dominant/non-dominant: Non-Dominant Hemiplegia - caused by: Cerebral infarction                Time: 4540-9811 OT Time Calculation (min): 25 min Charges:  OT General Charges $OT Visit: 1 Visit OT Evaluation $OT Eval Moderate Complexity: 1 Mod G-Codes:     [employer], OTR/L [phone]   Jakarius Flamenco M 05/25/2017, 1:16 PM

## 2017-05-25 NOTE — Progress Notes (Addendum)
Pharmacy Antibiotic Note  Clayton Green. is a 66 y.o. male admitted on 05/23/2017 with UTI.  Pharmacy has been consulted for meropenem dosing. Cultures today show ESBL E coli, patient previously being treated with ceftriaxone which has been discontinued. White blood cell count is trending down and patient creatinine is also improving since admission. Patient remains afebrile at this time.   Plan: Discontinue ceftriaxone  Start Meropenem 1g IV q8 hours  Continue to monitor clinical progression, length of therapy and ability to tolerate PO medications    Height:  (170.2 cm) Weight: 222 lb 6.4 oz (100.9 kg) IBW/kg (Calculated) : 66.1  Temp (24hrs), Avg:98.4 F (36.9 C), Min:97.8 F (36.6 C), Max:98.9 F (37.2 C)   Recent Labs Lab 05/20/17 1052 05/23/17 0737 05/23/17 0756 05/24/17 0351 05/25/17 0222  WBC 11.2*  --  11.7* 8.3 8.5  CREATININE 1.69* 2.00* 2.09* 1.68* 1.17    Estimated Creatinine Clearance: 69.3 mL/min (by C-G formula based on SCr of 1.17 mg/dL).    Allergies  Allergen Reactions  . Shellfish Allergy Nausea And Vomiting    Antimicrobials this admission: Ceftriaxone 10/7>>10/9 Meropenem 10/9>>    Microbiology results: 10/7 UCx: > 100k E Coli, ESBL  10/7 BCx: pending 10/7 MRSA PCR: negative   Thank you for allowing pharmacy to be a part of this patient's care.  Blake Divine, Pharm.D. PGY1 Pharmacy Resident 05/25/2017 10:18 AM Main Pharmacy: [phone]

## 2017-05-25 NOTE — Consult Note (Signed)
WOC Nurse wound consult note Reason for Consult: LE wounds Patient appears to have venous stasis dx.  Hemosiderin staining with venous dermatitis and mild LE edema.  Palpable pulses bilaterally.  Patient is not able to provide any history, he is aphasic with new CVA. No family in the room Wound type: Venous ulcerations on the LLE, small open area on the dorsal right foot. Measurement: LLE to partial thickness ulcerations; 2cm x 2.5cm x 0.1cm and 6cm x 5cm x 0.1cm  RLE dorsal foot: 0.5cm x 0.5cm x 0.1cm  Wound bed: Both ulcerations on the LLE; are clean, bloody and oozing, but not purulent, clean Right dorsal foot 100% yellow but not purulent Drainage (amount, consistency, odor) minimal  Periwound: intact, venous stasis changes/dermatitis Dressing procedure/placement/frequency: Xeroform to the open ulcers LLE, cover with ABD pads. Change every other day. Eucerin to the RLE, not between toes, apply daily.   Discussed POC with patient and bedside nurse.  Re consult if needed, will not follow at this time. Thanks  Oveda Dadamo [employer], RN,CWOCN, CNS, CWON-AP [phone])

## 2017-05-25 NOTE — Progress Notes (Signed)
Orthopedic Tech Progress Note Patient Details:  Clayton Green 1951-02-11 [id number]  Patient ID: Clayton Grebe., male   DOB: Apr 01, 1951, 66 y.o.   MRN: [id number]   Clayton Green 05/25/2017, 1:42 PM Called in bio-tech brace order; spoke with [facility]

## 2017-05-25 NOTE — Progress Notes (Signed)
Initial Nutrition Assessment  DOCUMENTATION CODES:   Obesity unspecified  INTERVENTION:   -Magic Cup TID with meals -MVI daily  NUTRITION DIAGNOSIS:   Increased nutrient needs related to wound healing as evidenced by estimated needs.  GOAL:   Patient will meet greater than or equal to 90% of their needs  MONITOR:   PO intake, Supplement acceptance, Diet advancement, Labs, Weight trends, Skin, I & O's  REASON FOR ASSESSMENT:   Low Braden    ASSESSMENT:   66 yo male with PMHx significant for HTN, HLD, Type 2 DM, CKD III, GERD, hypothyroidism, and MDD presenting with left sided weakness, left sided facial droop, and dysarthria.  10/8- s/p MBSS, advanced to dysphagia 1 diet with pudding thick liquids  RD evaluated pt with RN and SLP at bedside. Noted pt taking sips of water and Magic Cup with assistance from RN and SLP. Pt able to respond to close ended questions; reports his breakfast was "good". Pt consumed some liquids and 100% of Magic Cup off of meal tray.   Discussed case with SLP, who reports pt has made great progress over the past 24 hours, but shares concern with this RD regarding pt's ability to maintain adequate PO intake given mentation and current diet order if pt's condition does not improve.   Reviewed wt hx; UBW around 234#, but noted a 14.9% wt loss over the past month, which is significant for time frame, however, question accuracy of wt hx.   Nutrition-Focused physical exam completed. Findings are no fat depletion, no muscle depletion, and mild edema.   Last Hgb A1c: 5.9, which is indicative of good control (Home DM medication regimen 500 mg Metformin BID).   Labs reviewed: CBGS: 115-148 (inpatient orders for glycemic control are 0-9 units insulin aspart every 4 hours).   Diet Order:  DIET - DYS 1 Room service appropriate? Yes; Fluid consistency: Pudding Thick  Skin:  Wound (see comment) (st 2 sacrum, bilateral leg venous stasis ulcers)  Last BM:   PTA  Height:   Ht Readings from Last 1 Encounters:  05/23/17  (1.702 m)    Weight:   Wt Readings from Last 1 Encounters:  05/23/17 222 lb 6.4 oz (100.9 kg)    Ideal Body Weight:  67.3 kg  BMI:  Body mass index is 34.83 kg/m.  Estimated Nutritional Needs:   Kcal:  2000-2200  Protein:  110-125 grams  Fluid:  2.0-2.2 L  EDUCATION NEEDS:   Education needs no appropriate at this time  Rainey Rodger A. Mayford Knife, RD, LDN, CDE Pager: [phone] After hours Pager: [phone]

## 2017-05-25 NOTE — Evaluation (Signed)
Physical Therapy Evaluation Patient Details Name: Clayton Green. MRN: [id number] DOB: 01/30/1951 Today's Date: 05/25/2017   History of Present Illness  This 66 y.o. male admitted with Lt sided weakness, and communication deficits.  MRI of head showed:  Nonhemorrhagic multifocal infarcts bilateral anterior and posterior calculation, including left MCA, left MCA/PCA, right MCA, bilateral cerebellum, with the largest at right MCA. 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 PMH - DM, HTN, neuropathy, severe arthritis, CKD, depression.  Clinical Impression  Pt admitted with above diagnosis and presents to PT with functional limitations due to deficits listed below (See PT problem list). Pt needs skilled PT to maximize independence and safety to allow discharge to SNF. Pt with extensive physical and cognitive deficits that will require extended rehab to help reduce burden of care.     Follow Up Recommendations SNF    Equipment Recommendations  Other (comment) (To be assessed at next venue)    Recommendations for Other Services       Precautions / Restrictions Precautions Precautions: Fall Restrictions Weight Bearing Restrictions: No      Mobility  Bed Mobility Overal bed mobility: Needs Assistance Bed Mobility: Rolling;Sidelying to Sit;Sit to Supine Rolling: Total assist;+2 for physical assistance Sidelying to sit: Total assist;+2 for physical assistance   Sit to supine: Total assist;+2 for physical assistance   General bed mobility comments: Pt with extensor spasticity.  requires max facilitation to flex trunk, hips, knees, elbows.  Requires assist for all aspects of bed mobility.  Pt attempts to Push himeslf into sitting, but demonstrates pusher syndrome  Transfers                 General transfer comment: unable to safely attempt   Ambulation/Gait                Stairs            Wheelchair Mobility    Modified Rankin  (Stroke Patients Only)       Balance Overall balance assessment: Needs assistance Sitting-balance support: Feet supported;Single extremity supported Sitting balance-Leahy Scale: Zero Sitting balance - Comments: Pt with heavy extensor and Lt pushing.  he was unable to flex Lt knee while EOB despite facilitation to inhibit extensor spasticity.   Pt demonstrates pusher syndrome, and requires max assist/facilitation to prevent pushing.  Pt pushes heavily posteriorly and to the Lt.  He is unable to initiate forward translation of trunk or trunk flexion despite max assist and inhibition of spasticity.  Pt required +2 total assist to maintain EOB.                                     Pertinent Vitals/Pain Faces Pain Scale: No hurt    Home Living Family/patient expects to be discharged to:: Skilled nursing facility                 Additional Comments: Per pt and chart review pt lived alone in one level w/c accessible apt.       Prior Function Level of Independence: Needs assistance   Gait / Transfers Assistance Needed: Pt reports he ambulated with standard walker, however, chart review from last admission indicates pt was w/c bound, and was mod I for transfers to/from w/c.  After last admission, pt did go to SNF and was recently discharged at what appears to be w/c level.    ADL's / Homemaking  Assistance Needed: Per chart review, it appears pt was mod I with ADLs.  He reports to me he showers in seated position using a bench.  In July, he was sponge bathing per OT eval at that time (unsure of pt's accuracy   Comments: Per chart, pt received assist for IADLs and transportation.      Hand Dominance   Dominant Hand: Right (Pt reports Rt hand.  Chart indicates Lt hand )    Extremity/Trunk Assessment   Upper Extremity Assessment LUE Deficits / Details: Pt with extensor spasticity of UE and flexor spasticity of Lt hand.   After inhibitory techniques performed able to  achieve full PROM  LUE Sensation: decreased light touch;decreased proprioception LUE Coordination: decreased fine motor;decreased gross motor    Lower Extremity Assessment Lower Extremity Assessment: RLE deficits/detail;LLE deficits/detail RLE Deficits / Details: Pt with active movement noted but pt unable to consisitently follow commands due to poor motor planning and impaired communication. LLE Deficits / Details: Pt with extensor tone and unable to get pt into flexion at knee. Showed slight active movement of toes.     Cervical / Trunk Assessment Cervical / Trunk Assessment: Other exceptions Cervical / Trunk Exceptions: Pt with impaired ability to isolate trunk movements - pt with ? extensor spasticity vs extensor pushing   Communication   Communication: Expressive difficulties  Cognition Arousal/Alertness: Awake/alert Behavior During Therapy: Flat affect Overall Cognitive Status: Impaired/Different from baseline Area of Impairment: Attention;Following commands;Problem solving                   Current Attention Level: Focused   Following Commands: Follows one step commands inconsistently;Follows one step commands with increased time     Problem Solving: Slow processing;Decreased initiation;Difficulty sequencing;Requires verbal cues;Requires tactile cues General Comments: Pt demonstrates significant deficits with initiation and with motor planning as well as communication deficits making it difficult to accurately assess cognition       General Comments      Exercises Other Exercises Other Exercises: Resting hand splint delivered and on pt upon OT arrival.  Pt states "pain"  when asked how he is, and when asked where says "my left".   When Lt arm moved into his line of sight, and he was asked to point the area that hurts, he lifts his Rt arm.   Splint was removed and PROM performed Lt UE.  spasticity significantly improved.   all joints.  While performing PROM Lt  shoulder pt screamed "pain!" "sharp pain".  Scapular mobs depression, adduction, abduction, and downward rotation performed and scap assisted into upward rotation with PROM, with pt making no further complaints.  After PROM, splint was placed back on Lt UE, and within 3 mins. pt stated "pain"  "pain left", but continues to hold up Rt UE when asked specifically where.   Splint removed and discussed with RN.  Will keep splint off tonight, and reasess tomorrow.  RN to assess IV in Rt UE.   No signs of redness noted when splint was initially removed    Assessment/Plan    PT Assessment Patient needs continued PT services  PT Problem List Decreased strength;Decreased range of motion;Decreased activity tolerance;Decreased balance;Decreased mobility;Decreased coordination;Decreased cognition;Decreased knowledge of use of DME;Decreased safety awareness;Impaired tone;Obesity       PT Treatment Interventions DME instruction;Functional mobility training;Therapeutic activities;Neuromuscular re-education;Therapeutic exercise;Balance training;Cognitive remediation;Patient/family education    PT Goals (Current goals can be found in the Care Plan section)  Acute Rehab PT Goals Patient Stated Goal: Not  stated PT Goal Formulation: With patient Time For Goal Achievement: 06/08/17 Potential to Achieve Goals: Fair    Frequency Min 2X/week   Barriers to discharge Decreased caregiver support Lives alone    Co-evaluation PT/OT/SLP Co-Evaluation/Treatment: Yes Reason for Co-Treatment: Complexity of the patient's impairments (multi-system involvement);Necessary to address cognition/behavior during functional activity;For patient/therapist safety PT goals addressed during session: Mobility/safety with mobility         AM-PAC PT "6 Clicks" Daily Activity  Outcome Measure Difficulty turning over in bed (including adjusting bedclothes, sheets and blankets)?: Unable Difficulty moving from lying on back to sitting  on the side of the bed? : Unable Difficulty sitting down on and standing up from a chair with arms (e.g., wheelchair, bedside commode, etc,.)?: Unable Help needed moving to and from a bed to chair (including a wheelchair)?: Total Help needed walking in hospital room?: Total Help needed climbing 3-5 steps with a railing? : Total 6 Click Score: 6    End of Session   Activity Tolerance: Patient tolerated treatment well Patient left: in bed;with call bell/phone within reach;with bed alarm set   PT Visit Diagnosis: Other abnormalities of gait and mobility (R26.89);Difficulty in walking, not elsewhere classified (R26.2);Apraxia (R48.2);Hemiplegia and hemiparesis Hemiplegia - Right/Left: Left Hemiplegia - dominant/non-dominant: Dominant Hemiplegia - caused by: Cerebral infarction    Time: 1135-1156 PT Time Calculation (min) (ACUTE ONLY): 21 min   Charges:   PT Evaluation $PT Eval Moderate Complexity: 1 Mod     PT G CodesMarland Kitchen        [facility] [phone]   Angelina Ok [facility] 05/25/2017, 6:11 PM

## 2017-05-25 NOTE — Care Management Note (Addendum)
Case Management Note  Patient Details  Name: Clayton Green. MRN: [id number] Date of Birth: April 06, 1951  Subjective/Objective:   From home alone with Medi alert, has a wheelchair at home , noted form previous notes, also from previous notes, patient was in [facility], and before then had [facility] services with [facility].  Fritzi Mandes was stated in  Previous CSW note from other admit  to be [employer] coach.  Patient presents this admit with CVA, UTI, AKI, elevated trops, global and expressive aphasia.  Awaiting pt/ot eval.                 Action/Plan: NCM will follow along with CSW for dc needs.   Expected Discharge Date:                  Expected Discharge Plan:     In-House Referral:     Discharge planning Services  CM Consult  Post Acute Care Choice:    Choice offered to:     DME Arranged:    DME Agency:     HH Arranged:    HH Agency:     Status of Service:  In process, will continue to follow  If discussed at Long Length of Stay Meetings, dates discussed:    Additional Comments:  Leone Haven, RN 05/25/2017, 9:33 AM

## 2017-05-25 NOTE — Progress Notes (Signed)
Medicine attending: I examined this patient today together with resident physician Dr. Landis Martins and I concur with her evaluation and management plan which we discussed together. Significant improvement in global status.  Much more interactive.  Still some intermittent perseveration but overall appropriate, talking, following commands. Echocardiogram shows no gross abnormalities.  No atrial or ventricular clot.  No gross valvular abnormalities.  A calcified mitral annulus was noted but there was no restriction to leaflet mobility.  No stenosis.  Normal transvalvular velocity.  Therefore, likely not a risk factor for his presumed embolic stroke. We researched current recommendations for anticoagulation other than antiplatelet agents for presumed embolic stroke.  Risk versus benefit  still does not favor early anticoagulation in view of the minimal impact on recurrent stroke and significant 2.5 fold risk for intracranial hemorrhage. We will involve our physical and occupational therapist at this time. TEE and a subcutaneous loop recorder is planned. Following PT/OT consults, then rehab medicine consult.

## 2017-05-25 NOTE — Progress Notes (Signed)
STROKE TEAM PROGRESS NOTE   SUBJECTIVE (INTERVAL HISTORY) He is awake in bed calling for water, which is provided with supervision. He continues to have some expressive aphasia with perseveration. He is more awake and moving more than yesterday. He is on a dysphagia diet after swallowing evaluation. He continues to be treated with ceftriaxone for UTI.   OBJECTIVE Temp:  [97.8 F (36.6 C)-98.9 F (37.2 C)] 98.8 F (37.1 C) (10/09 1126) Pulse Rate:  [78-96] 84 (10/09 1126) Cardiac Rhythm: Normal sinus rhythm (10/09 0749) Resp:  [17-31] 25 (10/09 1126) BP: (117-162)/(70-103) 129/76 (10/09 1126) SpO2:  [94 %-100 %] 95 % (10/09 1126)   Recent Labs Lab 05/24/17 2002 05/24/17 2303 05/25/17 0258 05/25/17 0449 05/25/17 0715  GLUCAP 158* 115* 121* 148* 119*    Recent Labs Lab 05/20/17 1052 05/23/17 0737 05/23/17 0756 05/24/17 0351 05/25/17 0222  NA 137 138 137 142 141  K 3.5 3.1* 3.6 3.2* 3.7  CL 98 99* 99* 105 108  CO2 29  --  GLUCOSE 124* 109* 134* 124* 141*  BUN 33* 47* 49* 39* 29*  CREATININE 1.69* 2.00* 2.09* 1.68* 1.17  CALCIUM 8.4*  --  8.7* 8.2* 8.3*  MG  --   --   --   --  1.8    Recent Labs Lab 05/23/17 0756 05/24/17 0351  AST 39 33  ALT 19 15*  ALKPHOS 214* 170*  BILITOT 1.0 1.2  PROT 7.3 6.0*  ALBUMIN 3.3* 2.6*    Recent Labs Lab 05/20/17 1052 05/23/17 0737 05/23/17 0756 05/24/17 0351 05/25/17 0222  WBC 11.2*  --  11.7* 8.3 8.5  NEUTROABS 9,453*  --  9.1*  --   --   HGB 11.6* 12.9* 12.4* 10.3* 10.2*  HCT 34.5* 38.0* 39.4 33.2* 33.4*  MCV 89.4  --  93.8 93.0 94.6  PLT 191  --  159 144* 156    Recent Labs Lab 05/23/17 0756 05/23/17 1410 05/23/17 2136 05/24/17 0738  TROPONINI 0.21* 0.20* 0.25* 0.27*    Recent Labs  05/23/17 0756  LABPROT 14.6  INR 1.15    Recent Labs  05/23/17 1018  COLORURINE YELLOW  LABSPEC 1.041*  PHURINE 5.0  GLUCOSEU NEGATIVE  HGBUR SMALL*  BILIRUBINUR NEGATIVE  KETONESUR 5*  PROTEINUR  30*  NITRITE NEGATIVE  LEUKOCYTESUR LARGE*       Component Value Date/Time   CHOL 110 05/24/2017 0351   TRIG 92 05/24/2017 0351   HDL 49 05/24/2017 0351   CHOLHDL 2.2 05/24/2017 0351   VLDL 18 05/24/2017 0351   LDLCALC 43 05/24/2017 0351   Lab Results  Component Value Date   HGBA1C 5.9 (H) 05/24/2017   No results found for: LABOPIA, COCAINSCRNUR, LABBENZ, AMPHETMU, THCU, LABBARB  No results for input(s): ETH in the last 168 hours.  I have personally reviewed the radiological images below and agree with the radiology interpretations.  CTA Head and neck and CTP W Or Wo Contrast 05/23/2017 IMPRESSION: Missing M2 to M3 branch on the right without evidence of a retrievable proximal thrombus. This corresponds to the region of infarction demonstrated and noncontrast CT and for fusion. No upstream vascular lesion seen to predispose to stroke. Minimal atherosclerotic changes without stenosis or irregularity. Perfusion imaging shows complete infarction of 18 cc in the insular and posterior frontal region. Surrounding region of diminished for fusion is calculated at 66 cc, but this is probably exaggerated based on inclusion of some left ACA territory brain, which I think is probably  normal based on the vascular imaging.   Mr Brain Wo Contrast 05/23/2017 IMPRESSION: 1. Multifocal nonhemorrhagic infarcts involving anterior and posterior circulation bilaterally. This suggests a central embolic source. 2. The largest area of infarction involves the right MCA territory, specifically the right frontal operculum and insular cortex. 3. Confluent area of acute nonhemorrhagic infarct involving the posterior left parietal lobe and occipital lobe. 4. Additional punctate foci of acute infarction throughout the posterior circulation bilaterally. 5. Remote encephalomalacia involving the posterior left temporal and occipital lobe.   Ct Head Code Stroke Wo Contrast 05/23/2017 IMPRESSION: 1. Acute/subacute  infarction in the right middle cerebral artery territory with mild swelling but no hemorrhage. I would estimate that this is greater than 8 hours old given these findings. Region of infarction measures approximately 6 cm. 2. ASPECTS is 6   2D Echocardiogram   - Technically difficult; definity used; normal LV systolic function; mild diastolic dysfunction; calcified aortic valve with mild AS (mean gradient 11 mmHg); mild LAE; trace TR with moderate elevation of pulmonary pressure.  LE venous doppler 05/24/2017 Technically challenging due to pain in patient's extremities related to chronic wounds No DVT, positive for superficial thrombus in R greater saphenous vein  TEE pending   PHYSICAL EXAM  Temp:  [97.8 F (36.6 C)-98.9 F (37.2 C)] 98.8 F (37.1 C) (10/09 1126) Pulse Rate:  [78-96] 84 (10/09 1126) Resp:  [17-31] 25 (10/09 1126) BP: (117-162)/(70-103) 129/76 (10/09 1126) SpO2:  [94 %-100 %] 95 % (10/09 1126)  General - Obese, in no apparent distress.  Mental Status -  Unable to answer orientation questions appropriately today Language exam showed partial global aphasia, able to answer some questions appropriately, follow limited verbal commands, able to speak words, but not sentences, severe perseveration. Moderate dysarthria.  Cranial Nerves II - XII - II - Attends more to right but reacts to visual threat on left side III, IV, VI - Extraocular movements exam right gaze preference but crosses midline. V - Facial sensation intact bilaterally. VII - left facial droop. VIII - Hearing & vestibular intact bilaterally. X - Palate elevates symmetrically, moderate dysarthria. XI - Chin turning & shoulder shrug intact bilaterally. XII - Tongue protrusion intact.  Motor Strength - 4/5 strength on R sided extremities on pain or resisting fall, 0/5 LUE and slight withdraw LLE on pain stimulation.  Motor Tone - Muscle tone was normal  Reflexes - Babinski positive on the  left.  Sensory - Exam limited by ability to follow commands and verbalize   ASSESSMENT/PLAN [name]. is a 66 y.o. male with history of DM, HTN, HLD, hypothyroidism, bilateral lower extremity venous stasis, CHF admitted for slurry speech, left-sided weakness and urinary incontinence. Patient has recent fall with scalp hematoma.     Stroke:  Multifocal infarcts bilateral anterior and posterior circulation, consistent with cardioembolic infarct. Etiology unclear.   MRI  multifocal infarcts bilateral anterior and posterior calculation, including left MCA, left MCA/PCA, right MCA, bilateral cerebellum, with the largest at right MCA. 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  CTA head and neck - questionable right M2/M3 missing branches, no LVO.  2D Echo  EF 55-60%, unremarkable  LE venous Doppler no DVT  Recommend TEE and loop recorder to evaluate cardioembolic source. cardmaster aware.   LDL 43  HgbA1c 5.9  Heparin subcutaneous for VTE prophylaxis  DIET - DYS 1 Room service appropriate? Yes; Fluid consistency: Pudding Thick   aspirin 81 mg daily prior to admission, now on aspirin  325 mg daily  Patient counseled to be compliant with his antithrombotic medications  Ongoing aggressive stroke risk factor management  Therapy recommendations:  Pending  Disposition:  Pending  UTI  One-time Spiking fever with tachycardia, now resolved  On Rocephin  UA WBC TNTC  Urine culture E. Coli  Blood culture No Growth to date  Diabetes  HgbA1c 5.9 goal < 7.0  CBG monitoring  SSI  Hypertension Home meds Lasix and lisinopril Permissive hypertension (OK if <180/105) for 24-48 hours post stroke and then gradually normalized within 5-7 days.  Stable  Hyperlipidemia  Home meds:  Lipitor 20   LDL 43, goal < 70  Resume Lipitor 20 once PO access  Continue statin at discharge  Other Stroke Risk Factors  Advanced age  Obesity  CHF on  lasix and lisinopril at home  Other Active Problems  Hypothyroidism with elevated TSH -on Synthroid   Other Pertinent History  Bilateral LE wounds with dressing due to chronic venous stasis  Hospital day # 2  He shows marked improvement in level of alertness, exam participation, and attention to left sided stimuli. Superficial RLE thrombus found incidentally. Stroke pattern encompassing bilateral anterior and posterior distributions is strongly suggestive of central embolic source. TEE/loop pending and more thorough therapy assessment is pending. New stroke does not encompass language centers so more improvement in aphasia is likely.  Fuller Plan, MD PGY-III Internal Medicine Resident Pager# [phone] 05/25/2017, 1:24 PM   To contact Stroke Continuity provider, please refer to [email or web address]. After hours, contact General Neurology

## 2017-05-25 NOTE — Progress Notes (Signed)
  Speech Language Pathology Treatment: Dysphagia;Cognitive-Linquistic  Patient Details Name: Clayton Green. MRN: [id number] DOB: 11/05/1950 Today's Date: 05/25/2017 Time: 7829-5621 SLP Time Calculation (min) (ACUTE ONLY): 22 min  Assessment / Plan / Recommendation Clinical Impression  Pt is yelling for water and Pepsi upon SLP arrival. Mod cues were provided for recall of MBS and its results/recommendations from previous date. He shows intellectual awareness of his speech and swallowing impairments with Min cues, but needs Max cues to identify his other physical changes. Max cues are still needed to focus his attention to the L side of his environment. Pt has small amounts of L-sided pocketing with purees, which can be cleared with Mod A from SLP. SLP offered a spoonful of thickened liquid from pt's breakfast tray with immediate coughing observed. Upon closer inspection, liquids on his tray were not thickened to a full pudding thick consistency. SLP added additional thickener with only one subtle throat clear noted throughout the rest of his consumption. Education provided to RN about diet textures, aspiration precautions/swallowing strategies, and recommendation for crushed medications. Will continue to follow. Suspect that pt will need improvements in oral motor function and cognition before he will be ready for any dietary advancements.    HPI HPI: 66 year old man with a past medical history of diabetes hypertension hyperlipidemia depression hypothyroidism venous stasis ulcers and chronic diastolic heart failure brought in by [facility] for evaluation of left-sided weakness. CCT subacute/acute infarction right MCA territory affecting insula and frontoparietal region.  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        SLP Plan  Continue with current plan of care       Recommendations  Diet recommendations: Dysphagia 1 (puree);Pudding-thick liquid Liquids provided via:  Teaspoon Medication Administration: Crushed with puree Supervision: Staff to assist with self feeding;Full supervision/cueing for compensatory strategies Compensations: Minimize environmental distractions;Slow rate;Small sips/bites;Lingual sweep for clearance of pocketing Postural Changes and/or Swallow Maneuvers: Seated upright 90 degrees;Upright 30-60 min after meal                Oral Care Recommendations: Oral care before and after PO Follow up Recommendations: Inpatient Rehab;Skilled Nursing facility SLP Visit Diagnosis: Dysphagia, oropharyngeal phase (R13.12);Aphasia (R47.01);Cognitive communication deficit (H08.657) Plan: Continue with current plan of care       GO                Maxcine Ham 05/25/2017, 11:28 AM  Maxcine Ham, M.A. CCC-SLP [phone]

## 2017-05-25 NOTE — Progress Notes (Signed)
Patient has been alert and able to follow simple commands today. There is some left-sided extinction, but he can be coaxed into briefly looking to the left. Patient tolerates thickened water and appetite is good. Complained of pain this evening, but couldn't verbalize where the pain was. Administered PRN tylenol. Patient is rigid on the left side, and leans right. Vital signs stable on 1.5L O2.

## 2017-05-25 NOTE — Progress Notes (Signed)
Orthopedic Tech Progress Note Patient Details:  Clayton Green June 24, 1951 [id number]      [name] 05/25/2017, 1:39 PM Resting hand splint to left hand

## 2017-05-26 ENCOUNTER — Encounter: Payer: Self-pay | Admitting: Gastroenterology

## 2017-05-26 ENCOUNTER — Ambulatory Visit: Payer: Medicare Other | Admitting: Gastroenterology

## 2017-05-26 LAB — BASIC METABOLIC PANEL
Anion gap: 7 (ref 5–15)
BUN: 21 mg/dL — AB (ref 6–20)
CHLORIDE: 111 mmol/L (ref 101–111)
CO2: 24 mmol/L (ref 22–32)
Calcium: 8.1 mg/dL — ABNORMAL LOW (ref 8.9–10.3)
Creatinine, Ser: 0.96 mg/dL (ref 0.61–1.24)
GFR calc Af Amer: >60 mL/min (ref >60)
GFR calc non Af Amer: >60 mL/min (ref >60)
Glucose, Bld: 124 mg/dL — ABNORMAL HIGH (ref 65–99)
POTASSIUM: 4.1 mmol/L (ref 3.5–5.1)
Sodium: 142 mmol/L (ref 135–145)

## 2017-05-26 LAB — GLUCOSE, CAPILLARY
GLUCOSE-CAPILLARY: 106 mg/dL — AB (ref 65–99)
GLUCOSE-CAPILLARY: 129 mg/dL — AB (ref 65–99)
GLUCOSE-CAPILLARY: 137 mg/dL — AB (ref 65–99)
Glucose-Capillary: 121 mg/dL — ABNORMAL HIGH (ref 65–99)
Glucose-Capillary: 108 mg/dL — ABNORMAL HIGH (ref 65–99)
Glucose-Capillary: 154 mg/dL — ABNORMAL HIGH (ref 65–99)
Glucose-Capillary: 133 mg/dL — ABNORMAL HIGH (ref 65–99)

## 2017-05-26 LAB — CBC
HCT: 33.2 % — ABNORMAL LOW (ref 39.0–52.0)
HEMOGLOBIN: 10.1 g/dL — AB (ref 13.0–17.0)
MCH: 29 pg (ref 26.0–34.0)
MCHC: 30.4 g/dL (ref 30.0–36.0)
MCV: 95.4 fL (ref 78.0–100.0)
PLATELETS: 162 10*3/uL (ref 150–400)
RBC: 3.48 MIL/uL — AB (ref 4.22–5.81)
RDW: 18.7 % — ABNORMAL HIGH (ref 11.5–15.5)
WBC: 7.7 10*3/uL (ref 4.0–10.5)

## 2017-05-26 NOTE — Progress Notes (Signed)
Subjective: Patient seen and examined. Communication deficits slowly improving. Continues to follow simple commands intermittently. His responses to questions are occasionally appropriate, but continues to respond "yes" to most questions. States he has pain in his right leg today.   Objective:  Vital signs in last 24 hours: Vitals:   05/25/17 2300 05/26/17 0000 05/26/17 0400 05/26/17 0737  BP:  139/85 135/85 (!) 140/92  Pulse:  81 86 79  Resp:  (!) 21 (!) 22 (!) 22  Temp: 98.7 F (37.1 C)   98.3 F (36.8 C)  TempSrc: Oral   Axillary  SpO2:  97% 97% 97%  Weight:      Height:       General: Being fed breakfast , NAD HEENT: [place], left infraorbital bruising, midline forehead abrasion, right sided gaze preference leftward saccades, cannot track Cardiac: RRR, No R/M/G appreciated Pulm: normal effort, CTAB  Abd: soft, non tender, non distended, BS normal Ext: extremities with chronic venous stasis changes, left LE wrapped with clean and dry bandages, right LE without oozing ulceration, bruising present on right knee   Neuro: alert and oriented X3 (person, [place], 2018), right sided gaze preference leftward saccades, left sided facial droop, dysarthric speech, strength 4/5 on right upper and 3/5 right lower extremity, left upper and lower extremity 0/5, lower extremities remain contracted and rigid, upward Babinski on left   Assessment/Plan:  Principal Problem:   Acute ischemic stroke (HCC) Active Problems:   Right sided cerebral hemisphere cerebrovascular accident (CVA) (HCC)   AKI (acute kidney injury) (HCC)   Acute cystitis with hematuria   Controlled type 2 diabetes mellitus with hyperglycemia, without long-term current use of insulin (HCC)   Cerebrovascular accident (CVA) due to embolism of right middle cerebral artery (HCC)   Pressure injury of skin  Right MCA CVA Patient with acute onset of left sided weakness, left facial droop, dysarthria, right sided gaze preference of  unknown duration so tPA was not adminstered. MRI showed multifocal nonhemorrhagic infarcts involving anterior and posterior circulation bilaterally, largest area of infarction involving R MCA territory, suggesting a central embolic source. Patient remains hemodynamically stable and is protecting his airway, sating well on 1.5 L  [place]. PT/OT recommend SNF.  -Neurology following appreciate recs -TEE pending  -Telemetry -Q2 hour neuro checks -ASA 325 mg crushed with food or 300 mg per rectum daily, Lipitor 20 mg  -PT/OT, Speech therapy, Nutritional management following; appreciate recs  UTI Urine culture + for ESBL resistant E. Coli. Afebrile without leukocytosis.  -Meropenem 1 g q8 hours -Day 3 of abx, Day 2 of Meropenem -Plans to switch to Bactrim tomorrow     Type 2 DM A1C 5.9, well controlled. Complicated by neuropathy.  -On metformin 500 mg BID at home -Insulin SS TID with meals    HTN, HLD BP normalizing 140/92 .  -Holding HTN Lisinopril 20 mg   Hypothryoidsim Post surgical hypothyroidism. TSH elevated at 7.525.  -Synthroid 176 mcg QD crushed with pudding   Chronic venous insufficiency Patient with +1 lower extremity edema and bilateral venous ulcerations. 2 venous ulcerations on the LLE, small open area on the dorsal right foot. Both ulcerations on the LLE are clean, bloody and oozing, but not purulent. Right dorsal foot 100% yellow but not purulent.  -Wound care recommendations relayed to nursing; will re-consult if needed   F: 100 cc/hr NS E: 40 meq IV K+, Repletion as needed N: Dys 1 PuddingThick  DVT prophylaxis: SQ heparin   Dispo: Anticipated discharge in approximately  3-4 days.   Toney Rakes, MD 05/26/2017, 10:03 AM Pager: [phone]

## 2017-05-26 NOTE — Progress Notes (Addendum)
Occupational Therapy Treatment Patient Details Name: Clayton Green. MRN: [id number] DOB: April 17, 1951 Today's Date: 05/26/2017    History of present illness This 66 y.o. male admitted with Lt sided weakness, and communication deficits.  MRI of head showed:  Nonhemorrhagic multifocal infarcts bilateral anterior and posterior calculation, including left MCA, left MCA/PCA, right MCA, bilateral cerebellum, with the largest at right MCA. 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 PMH - DM, HTN, neuropathy, severe arthritis, CKD, depression.   OT comments  Pt seen x 2 by OT.  Splint applied this am, and checked throughout the day.  Pt continues to be somewhat unreliable re: pain reporting, but noticeably less c/o pain, especially after IV Rt UE removed.   No evidence of redness or edema from splint, and he seemed to tolerate it fairly well.  Spasticity improved somewhat, and PROM WFL Lt UE.  Will continue to monitor pt's tolerance for splint wear, and hopefully establish splint schedule tomorrow.   Follow Up Recommendations  SNF    Equipment Recommendations  None recommended by OT    Recommendations for Other Services      Precautions / Restrictions Precautions Precautions: Fall       Mobility Bed Mobility                  Transfers                      Balance                                           ADL either performed or assessed with clinical judgement   ADL Overall ADL's : Needs assistance/impaired     Grooming: Wash/dry face;Bed level;Total assistance Grooming Details (indicate cue type and reason): Pt unable to initiate wiping his mouth.  Once hand over hand assist provided, he was able to assist very minimally with wiping lower lip                                      Vision       Perception     Praxis      Cognition Arousal/Alertness: Awake/alert Behavior During Therapy: Flat  affect Overall Cognitive Status: Impaired/Different from baseline Area of Impairment: Attention;Following commands;Problem solving                   Current Attention Level: Focused   Following Commands: Follows one step commands inconsistently;Follows one step commands with increased time     Problem Solving: Slow processing;Decreased initiation;Difficulty sequencing;Requires verbal cues;Requires tactile cues General Comments: Pt demonstrates significant deficits with initiation and with motor planning as well as communication deficits making it difficult to accurately assess cognition         Exercises Other Exercises Other Exercises: Pt with moderate flexor spasticity of hand and extensor spasiticity of UE.  PROM to fingers performed and splint applied.  Pt at that time indicated pain, but pain appears to be due to Rt IV site, although pt continues to be inconsistent.  Splint checked at 1.5 hours and pt did not indicated pain at that time.  OT returned after 3 hours, and pt with no definitive indication of pain.  Splint removed, and skin checked with no evidence of redness, increased  edema.  PROM performed all joints of hand, wrist, forearm, elbow, and shoulder flexion x 10 reps with pt supine.  Will keep splint off through tonight and establish splint schedule tomorrow if he continues to tolerate splint okay tomorrow.    Shoulder Instructions       General Comments      Pertinent Vitals/ Pain       Pain Assessment: Faces Faces Pain Scale: Hurts little more Pain Location: uncertain, but question if IV site Rt UE causing pain, as pt with less complaint of pain once that was removed  Pain Descriptors / Indicators:  (stating "hurt" ) Pain Intervention(s): Monitored during session  Home Living                                          Prior Functioning/Environment              Frequency  Min 2X/week        Progress Toward Goals  OT Goals(current  goals can now be found in the care plan section)  Progress towards OT goals: Progressing toward goals     Plan Discharge plan remains appropriate    Co-evaluation      Reason for Co-Treatment: Complexity of the patient's impairments (multi-system involvement)          AM-PAC PT "6 Clicks" Daily Activity     Outcome Measure   Help from another person eating meals?: Total Help from another person taking care of personal grooming?: Total Help from another person toileting, which includes using toliet, bedpan, or urinal?: Total Help from another person bathing (including washing, rinsing, drying)?: Total Help from another person to put on and taking off regular upper body clothing?: Total Help from another person to put on and taking off regular lower body clothing?: Total 6 Click Score: 6    End of Session Equipment Utilized During Treatment: Oxygen  OT Visit Diagnosis: Hemiplegia and hemiparesis;Cognitive communication deficit (R41.841) Symptoms and signs involving cognitive functions: Cerebral infarction Hemiplegia - Right/Left: Left Hemiplegia - dominant/non-dominant: Non-Dominant Hemiplegia - caused by: Cerebral infarction   Activity Tolerance No increased pain   Patient Left in bed;with call bell/phone within reach;with bed alarm set   Nurse Communication Other (comment) (splint status )        Time: 1610-9604 and 1154 - 1204  OT Time Calculation (min): 25 min and 13 mins   Charges: OT General Charges $OT Visit: 2 Visit OT Treatments $Neuromuscular Re-education: 8-22 mins $Orthotics/Prosthetics Check: 8-22 mins and 8-22 mins   [employer], OTR/L [phone]    Jeani Hawking M 05/26/2017, 6:39 PM

## 2017-05-26 NOTE — Progress Notes (Signed)
STROKE TEAM PROGRESS NOTE   SUBJECTIVE (INTERVAL HISTORY) Mental status continues to improve and his word selection is improved despite continued perseveration mostly  "yes" and "water." On meropenem for UTI.  He remains on nectar thick liquids. Participating with physical therapy and recommended for SNF level of care.   OBJECTIVE Temp:  [98.3 F (36.8 C)-99.4 F (37.4 C)] 99.4 F (37.4 C) (10/10 1139) Pulse Rate:  [66-108] 89 (10/10 1139) Cardiac Rhythm: Normal sinus rhythm (10/10 0841) Resp:  [18-32] 26 (10/10 1139) BP: (127-140)/(74-92) 129/74 (10/10 1139) SpO2:  [97 %-99 %] 97 % (10/10 1139)   Recent Labs Lab 05/25/17 1914 05/25/17 2314 05/26/17 0330 05/26/17 0732 05/26/17 1129  GLUCAP 141* 115* 121* 108* 154*    Recent Labs Lab 05/20/17 1052 05/23/17 0737 05/23/17 0756 05/24/17 0351 05/25/17 0222 05/26/17 0449  NA 137 138 137 142 141 142  K 3.5 3.1* 3.6 3.2* 3.7 4.1  CL 98 99* 99* 105 108 111  CO2 29  --  GLUCOSE 124* 109* 134* 124* 141* 124*  BUN 33* 47* 49* 39* 29* 21*  CREATININE 1.69* 2.00* 2.09* 1.68* 1.17 0.96  CALCIUM 8.4*  --  8.7* 8.2* 8.3* 8.1*  MG  --   --   --   --  1.8  --     Recent Labs Lab 05/23/17 0756 05/24/17 0351  AST 39 33  ALT 19 15*  ALKPHOS 214* 170*  BILITOT 1.0 1.2  PROT 7.3 6.0*  ALBUMIN 3.3* 2.6*    Recent Labs Lab 05/20/17 1052 05/23/17 0737 05/23/17 0756 05/24/17 0351 05/25/17 0222 05/26/17 0449  WBC 11.2*  --  11.7* 8.3 8.5 7.7  NEUTROABS 9,453*  --  9.1*  --   --   --   HGB 11.6* 12.9* 12.4* 10.3* 10.2* 10.1*  HCT 34.5* 38.0* 39.4 33.2* 33.4* 33.2*  MCV 89.4  --  93.8 93.0 94.6 95.4  PLT 191  --  159 144* 156 162    Recent Labs Lab 05/23/17 0756 05/23/17 1410 05/23/17 2136 05/24/17 0738  TROPONINI 0.21* 0.20* 0.25* 0.27*   No results for input(s): LABPROT, INR in the last 72 hours. No results for input(s): COLORURINE, LABSPEC, PHURINE, GLUCOSEU, HGBUR, BILIRUBINUR, KETONESUR,  PROTEINUR, UROBILINOGEN, NITRITE, LEUKOCYTESUR in the last 72 hours.  Invalid input(s): APPERANCEUR     Component Value Date/Time   CHOL 110 05/24/2017 0351   TRIG 92 05/24/2017 0351   HDL 49 05/24/2017 0351   CHOLHDL 2.2 05/24/2017 0351   VLDL 18 05/24/2017 0351   LDLCALC 43 05/24/2017 0351   Lab Results  Component Value Date   HGBA1C 5.9 (H) 05/24/2017   No results found for: LABOPIA, COCAINSCRNUR, LABBENZ, AMPHETMU, THCU, LABBARB  No results for input(s): ETH in the last 168 hours.  I have personally reviewed the radiological images below and agree with the radiology interpretations.  CTA Head and neck and CTP W Or Wo Contrast 05/23/2017 IMPRESSION: Missing M2 to M3 branch on the right without evidence of a retrievable proximal thrombus. This corresponds to the region of infarction demonstrated and noncontrast CT and for fusion. No upstream vascular lesion seen to predispose to stroke. Minimal atherosclerotic changes without stenosis or irregularity. Perfusion imaging shows complete infarction of 18 cc in the insular and posterior frontal region. Surrounding region of diminished for fusion is calculated at 66 cc, but this is probably exaggerated based on inclusion of some left ACA territory brain, which I think is probably normal based  on the vascular imaging.   Mr Brain Wo Contrast 05/23/2017 IMPRESSION: 1. Multifocal nonhemorrhagic infarcts involving anterior and posterior circulation bilaterally. This suggests a central embolic source. 2. The largest area of infarction involves the right MCA territory, specifically the right frontal operculum and insular cortex. 3. Confluent area of acute nonhemorrhagic infarct involving the posterior left parietal lobe and occipital lobe. 4. Additional punctate foci of acute infarction throughout the posterior circulation bilaterally. 5. Remote encephalomalacia involving the posterior left temporal and occipital lobe.   Ct Head Code Stroke Wo  Contrast 05/23/2017 IMPRESSION: 1. Acute/subacute infarction in the right middle cerebral artery territory with mild swelling but no hemorrhage. I would estimate that this is greater than 8 hours old given these findings. Region of infarction measures approximately 6 cm. 2. ASPECTS is 6   2D Echocardiogram   - Technically difficult; definity used; normal LV systolic function; mild diastolic dysfunction; calcified aortic valve with mild AS (mean gradient 11 mmHg); mild LAE; trace TR with moderate elevation of pulmonary pressure.  LE venous doppler 05/24/2017 Technically challenging due to pain in patient's extremities related to chronic wounds No DVT, positive for superficial thrombus in R greater saphenous vein  TEE pending   PHYSICAL EXAM  Temp:  [98.3 F (36.8 C)-99.4 F (37.4 C)] 99.4 F (37.4 C) (10/10 1139) Pulse Rate:  [66-108] 89 (10/10 1139) Resp:  [18-32] 26 (10/10 1139) BP: (127-140)/(74-92) 129/74 (10/10 1139) SpO2:  [97 %-99 %] 97 % (10/10 1139)  General - Obese, in no apparent distress.  Mental Status -  Oriented to person, place, month and year Language exam showed partial global aphasia, able to answer some questions appropriately, follow limited verbal commands, able to speak words, but not sentences, severe perseveration. Moderate dysarthria.  Cranial Nerves II - XII - II - Attends more to right but reacts to visual threat on left side III, IV, VI - Extraocular movements exam right gaze preference but he crosses midline to command. V - Facial sensation intact bilaterally. VII - left facial droop. VIII - Hearing grossly intact. X - Palate elevates symmetrically, moderate dysarthria. XII - Tongue protrusion midline.  Motor Strength - 4/5 strength on R sided extremities on pain or resisting fall, 0/5 left extremities although has reflexive withdrawl  Reflexes - Positive babinksi on LLE, withdraws LLE to pain but no movement to command   ASSESSMENT/PLAN [name] is a 66 y.o. male with history of DM, HTN, HLD, hypothyroidism, bilateral lower extremity venous stasis, CHF admitted for slurry speech, left-sided weakness and urinary incontinence. Patient has recent fall with scalp hematoma.     Stroke:  Multifocal infarcts bilateral anterior and posterior circulation, consistent with cardioembolic infarct. Etiology unclear.   MRI  multifocal infarcts bilateral anterior and posterior calculation, including left MCA, left MCA/PCA, right MCA, bilateral cerebellum, with the largest at right MCA. 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  CTA head and neck - questionable right M2/M3 missing branches, no LVO.  2D Echo  EF 55-60%, unremarkable  LE venous Doppler no DVT  Recommend TEE and loop recorder to evaluate cardioembolic source. cardmaster aware on schedule 10/11  LDL 43  HgbA1c 5.9  Heparin subcutaneous for VTE prophylaxis DIET - DYS 1 Room service appropriate? Yes; Fluid consistency: Pudding Thick  Diet NPO time specified   aspirin 81 mg daily prior to admission, now on aspirin 325 mg daily  Patient counseled to be compliant with his antithrombotic medications  Ongoing aggressive stroke risk  factor management  Therapy recommendations:  SNF  Disposition:  Pending  UTI  One-time Spiking fever with tachycardia, now resolved  On Rocephin  UA WBC TNTC  Urine culture E. Coli - CTX resistant  Blood culture No Growth to date  Diabetes  HgbA1c 5.9 goal < 7.0  CBG monitoring  SSI  Hypertension Home meds Lasix and lisinopril Permissive hypertension (OK if <180/105) for 24-48 hours post stroke and then gradually normalized within 5-7 days.  Stable  Hyperlipidemia  Home meds:  Lipitor 20   LDL 43, goal < 70  Resume Lipitor 20 once PO access  Continue statin at discharge  Other Stroke Risk Factors  Advanced age  Obesity  CHF on lasix and lisinopril at home  Other Active  Problems  Hypothyroidism with elevated TSH -on Synthroid   Other Pertinent History  Bilateral LE wounds with dressing due to chronic venous stasis  Hospital day # 3  He is scheduled for TEE tomorrow for embolic pattern of infarct. Therapy is participating and recommending SNF level care at discharge. His language continues to improve despite significant perseveration.  Fuller Plan, MD PGY-III Internal Medicine Resident Pager# [phone] 05/26/2017, 2:58 PM   To contact Stroke Continuity provider, please refer to [email or web address]. After hours, contact General Neurology

## 2017-05-26 NOTE — Progress Notes (Signed)
    [facility] has been requested to perform a transesophageal echocardiogram on Clayton Green. for stroke.  After careful review of history and examination, the risks and benefits of transesophageal echocardiogram have been explained including risks of esophageal damage, perforation (1:10,000 risk), bleeding, pharyngeal hematoma as well as other potential complications associated with conscious sedation including aspiration, arrhythmia, respiratory failure and death. Alternatives to treatment were discussed, questions were answered. Patient is willing to proceed.   Patient was able to answer all orientation questions correctly. I attempted to call his emergency contact to better understand his mental status baseline, but was unable to reach him.   Pt is scheduled for TEE tomorrow 05/27/17 at 1500. NPO at MN.  Roe Rutherford Duke, [place]  05/26/2017 3:24 PM

## 2017-05-26 NOTE — NC FL2 (Signed)
[place] MEDICAID FL2 LEVEL OF CARE SCREENING TOOL     IDENTIFICATION  Patient Name: Clayton Green. Birthdate: 1951-02-15 Sex: male Admission Date (Current Location): 05/23/2017  [facility] and [place] Number:  Chiropodist and Address:  [facility], [address], [place], [place] [address]      Provider Number: [id number]  Attending Physician Name and Address:  Levert Feinstein, MD  Relative Name and Phone Number:       Current Level of Care: Hospital Recommended Level of Care: Skilled Nursing Facility Prior Approval Number:    Date Approved/Denied:   PASRR Number: [id number] F- good through 06/17/17  Discharge Plan: SNF    Current Diagnoses: Patient Active Problem List   Diagnosis Date Noted  . Pressure injury of skin 05/25/2017  . Cerebrovascular accident (CVA) due to embolism of right middle cerebral artery (HCC)   . AKI (acute kidney injury) (HCC)   . Acute cystitis with hematuria   . Controlled type 2 diabetes mellitus with hyperglycemia, without long-term current use of insulin (HCC)   . Acute ischemic stroke (HCC) 05/23/2017  . Right sided cerebral hemisphere cerebrovascular accident (CVA) (HCC) 05/23/2017  . CKD (chronic kidney disease), stage III (HCC) 05/20/2017  . Insomnia 05/20/2017  . Cervical stenosis of spine 05/20/2017  . Hyperlipidemia associated with type 2 diabetes mellitus (HCC) 09/11/2016  . Chronic low back pain 09/11/2016  . Degenerative joint disease (DJD) of lumbar spine 09/11/2016  . GERD (gastroesophageal reflux disease) 08/12/2016  . Chronic venous insufficiency 08/05/2016  . Elevated serum creatinine 08/05/2016  . Biliary colic 06/21/2016  . Cholelithiasis 06/21/2016  . 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 Postoperative hypothyroidism 03/28/2015  . Vitamin D deficiency 03/28/2015  . 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 Essential  hypertension 03/28/2015    Orientation RESPIRATION BLADDER Height & Weight     Self, Place, Situation  O2 (1.5 L [place]) Incontinent, External catheter Weight: 222 lb 6.4 oz (100.9 kg) Height:   (170.2 cm)  BEHAVIORAL SYMPTOMS/MOOD NEUROLOGICAL BOWEL NUTRITION STATUS      Incontinent Diet (see DC summary)  AMBULATORY STATUS COMMUNICATION OF NEEDS Skin   Extensive Assist Verbally PU Stage and Appropriate Care   PU Stage 2 Dressing:  (located on buttocks- foam dressing changes)                   Personal Care Assistance Level of Assistance  Bathing, Dressing, Feeding Bathing Assistance: Maximum assistance Feeding assistance: Maximum assistance Dressing Assistance: Maximum assistance     Functional Limitations Info             SPECIAL CARE FACTORS FREQUENCY  PT (By licensed PT), OT (By licensed OT)     PT Frequency: 5/wk OT Frequency: 5/wk            Contractures      Additional Factors Info  Code Status, Allergies, Isolation Precautions, Insulin Sliding Scale Code Status Info: FULL Allergies Info: Shellfish Allergy   Insulin Sliding Scale Info: 6/day Isolation Precautions Info: ESBL     Current Medications (05/26/2017):  This is the current hospital active medication list Current Facility-Administered Medications  Medication Dose Route Frequency Provider Last Rate Last Dose  . 0.9 % NaCl with KCl 20 mEq/ L  infusion   Intravenous Continuous Nyra Market, MD 100 mL/hr at 05/26/17 0534    . acetaminophen (TYLENOL) tablet 650 mg  650 mg  Oral Q6H PRN Nyra Market, MD   650 mg at 05/25/17 1757   Or  . acetaminophen (TYLENOL) suppository 650 mg  650 mg Rectal Q6H PRN Nyra Market, MD   650 mg at 05/23/17 2009  . aspirin suppository 300 mg  300 mg Rectal Daily Nyra Market, MD   300 mg at 05/24/17 1003   Or  . aspirin tablet 325 mg  325 mg Oral Daily Nyra Market, MD   325 mg at 05/25/17 1048  . atorvastatin (LIPITOR) tablet 20 mg  20 mg Oral  Daily Marvel Plan, MD   20 mg at 05/25/17 1048  . enoxaparin (LOVENOX) injection 50 mg  50 mg Subcutaneous Q24H Armandina Stammer, RPH   50 mg at 05/25/17 1742  . hydrocerin (EUCERIN) cream   Topical BID Levert Feinstein, MD      . insulin aspart (novoLOG) injection 0-9 Units  0-9 Units Subcutaneous Q4H Nyra Market, MD   1 Units at 05/26/17 0534  . levothyroxine (SYNTHROID, LEVOTHROID) tablet 176 mcg  176 mcg Oral QAC breakfast Toney Rakes, MD   176 mcg at 05/26/17 0831  . MEDLINE mouth rinse  15 mL Mouth Rinse BID Levert Feinstein, MD   15 mL at 05/25/17 2140  . meropenem (MERREM) 1 g in sodium chloride 0.9 % 100 mL IVPB  1 g Intravenous Q8H Nyra Market, MD   Stopped at 05/26/17 0605  . multivitamin with minerals tablet 1 tablet  1 tablet Oral Daily Levert Feinstein, MD   1 tablet at 05/25/17 1252  . PARoxetine (PAXIL) tablet 30 mg  30 mg Oral Daily Marvel Plan, MD   30 mg at 05/25/17 1048  . RESOURCE THICKENUP CLEAR   Oral PRN Levert Feinstein, MD      . senna-docusate (Senokot-S) tablet 1 tablet  1 tablet Oral QHS PRN Nyra Market, MD         Discharge Medications: Please see discharge summary for a list of discharge medications.  Relevant Imaging Results:  Relevant Lab Results:   Additional Information SS#: [id number]  [name], LCSW

## 2017-05-27 ENCOUNTER — Encounter (HOSPITAL_COMMUNITY)
Admission: EM | Disposition: A | Discharge status: Skilled Nursing Facility | Payer: Self-pay | Source: Home / Self Care | Attending: Oncology

## 2017-05-27 ENCOUNTER — Inpatient Hospital Stay (HOSPITAL_COMMUNITY): Payer: Medicare Other

## 2017-05-27 ENCOUNTER — Encounter (HOSPITAL_COMMUNITY): Payer: Self-pay

## 2017-05-27 DIAGNOSIS — N390 Urinary tract infection, site not specified: Secondary | ICD-10-CM

## 2017-05-27 DIAGNOSIS — B9620 Unspecified Escherichia coli [E. coli] as the cause of diseases classified elsewhere: Secondary | ICD-10-CM | POA: Diagnosis present

## 2017-05-27 DIAGNOSIS — B964 Proteus (mirabilis) (morganii) as the cause of diseases classified elsewhere: Secondary | ICD-10-CM

## 2017-05-27 LAB — BASIC METABOLIC PANEL
ANION GAP: 6 (ref 5–15)
Anion gap: 7 (ref 5–15)
BUN: 17 mg/dL (ref 6–20)
BUN: 15 mg/dL (ref 6–20)
CHLORIDE: 110 mmol/L (ref 101–111)
CO2: 24 mmol/L (ref 22–32)
CO2: 24 mmol/L (ref 22–32)
Calcium: 8.2 mg/dL — ABNORMAL LOW (ref 8.9–10.3)
Calcium: 8.5 mg/dL — ABNORMAL LOW (ref 8.9–10.3)
Chloride: 110 mmol/L (ref 101–111)
Creatinine, Ser: 0.9 mg/dL (ref 0.61–1.24)
Creatinine, Ser: 0.86 mg/dL (ref 0.61–1.24)
GFR calc Af Amer: >60 mL/min (ref >60)
GFR calc Af Amer: >60 mL/min (ref >60)
GFR calc non Af Amer: >60 mL/min (ref >60)
GFR calc non Af Amer: >60 mL/min (ref >60)
GLUCOSE: 115 mg/dL — AB (ref 65–99)
GLUCOSE: 110 mg/dL — AB (ref 65–99)
POTASSIUM: 4.1 mmol/L (ref 3.5–5.1)
POTASSIUM: 4.2 mmol/L (ref 3.5–5.1)
Sodium: 140 mmol/L (ref 135–145)
Sodium: 141 mmol/L (ref 135–145)

## 2017-05-27 LAB — CBC
HEMATOCRIT: 34.9 % — AB (ref 39.0–52.0)
HEMOGLOBIN: 10.7 g/dL — AB (ref 13.0–17.0)
MCH: 29.2 pg (ref 26.0–34.0)
MCHC: 30.7 g/dL (ref 30.0–36.0)
MCV: 95.4 fL (ref 78.0–100.0)
Platelets: 185 10*3/uL (ref 150–400)
RBC: 3.66 MIL/uL — ABNORMAL LOW (ref 4.22–5.81)
RDW: 18.5 % — AB (ref 11.5–15.5)
WBC: 8.9 10*3/uL (ref 4.0–10.5)

## 2017-05-27 LAB — PROTIME-INR
INR: 1.02
Prothrombin Time: 13.4 seconds (ref 11.4–15.2)

## 2017-05-27 LAB — GLUCOSE, CAPILLARY
GLUCOSE-CAPILLARY: 102 mg/dL — AB (ref 65–99)
GLUCOSE-CAPILLARY: 101 mg/dL — AB (ref 65–99)
Glucose-Capillary: 104 mg/dL — ABNORMAL HIGH (ref 65–99)
Glucose-Capillary: 96 mg/dL (ref 65–99)
Glucose-Capillary: 119 mg/dL — ABNORMAL HIGH (ref 65–99)
Glucose-Capillary: 100 mg/dL — ABNORMAL HIGH (ref 65–99)

## 2017-05-27 SURGERY — CANCELLED PROCEDURE

## 2017-05-27 MED ORDER — SULFAMETHOXAZOLE-TRIMETHOPRIM 200-40 MG/5ML PO SUSP: 20.0000 mL | Freq: Every day | ORAL | Status: DC

## 2017-05-27 MED ORDER — SULFAMETHOXAZOLE-TRIMETHOPRIM 200-40 MG/5ML PO SUSP
20.0000 mL | Freq: Two times a day (BID) | ORAL | Status: AC
  Administered 2017-05-27 – 2017-05-31 (×8): 20 mL via ORAL
  Filled 2017-05-27 (×10): qty 20

## 2017-05-27 MED ORDER — SODIUM CHLORIDE 0.9 % IV SOLN: INTRAVENOUS | Status: DC

## 2017-05-27 NOTE — Progress Notes (Signed)
PT Cancellation Note  Patient Details Name: Clayton Green. MRN: [id number] DOB: 1951-05-28   Cancelled Treatment:    Reason Eval/Treat Not Completed: Patient at procedure or test/unavailable. Pt at TEE.   Angelina Ok [place] 05/27/2017, 2:21 PM [employer] PT [phone]

## 2017-05-27 NOTE — H&P (View-Only) (Signed)
Neurology Consultation  Reason for Consult: code stroke Referring Physician: Dr Hyacinth Meeker - EDP  CC: Left-sided weakness  History is obtained from: EMS and chart  HPI: Crews Mccollam. is a 66 y.o. male  was past medical history documented in the chart of diabetes, hypertension, hyperlipidemia, depression, hypothyroidism, venous stasis ulcers of both legs , chronic diastolic heart failure, who was brought in by [facility] when they were called into his house when he presses life alert button. Upon arrival to his house, they found him not being able to move the left side, and extremely slurred speech. They also found that he had urine incontinence of the time. He could not tell them when his symptoms started. There was no one else at the apartment to provide the history. With the concern for stroke, he was brought straight to the emergency room over at [facility] and a code stroke was activated. Upon chart review, the patient was recently in [facility] ER for evaluation of falls and scalp hematoma. It is gleaned from the chart that he has been having significant difficulties with ambulation for a while. He is unable to provide history. Difficulty just last known normal and difficult to guess the modified Rankin scale  LKW: Unknown tpa given?: no, unknown last known normal, established hypodensity on CT Premorbid modified Rankin scale (mRS): Best guess 3 or 4   ROS:  Unable to obtain due to altered mental status.   Past Medical History:  Diagnosis Date  . Acquired hypothyroidism    a. s/p thyroidectomy.  . Arthritis   . Depression   . Diabetes mellitus without complication (HCC)   . GERD (gastroesophageal reflux disease)   . Hyperlipidemia   . Hypertension   . Vitamin D deficiency     Family History  Problem Relation Age of Onset  . Diabetes Mother   . Cancer Father     Social History:   reports that he has never smoked. He has never used smokeless tobacco. He reports that  he does not drink alcohol or use drugs.  Medications No current facility-administered medications for this encounter.   Current Outpatient Prescriptions:  .  acetaminophen (TYLENOL) 500 MG tablet, Take 500 mg by mouth 2 (two) times daily as needed., Disp: , Rfl:  .  aspirin 81 MG tablet, Take 1 tablet (81 mg total) by mouth daily., Disp: 30 tablet, Rfl: 11 .  atorvastatin (LIPITOR) 20 MG tablet, Take 1 tablet (20 mg total) by mouth daily., Disp: 30 tablet, Rfl: 11 .  Cholecalciferol 2000 units CAPS, Take 1 capsule (2,000 Units total) by mouth daily., Disp: 90 each, Rfl: 3 .  furosemide (LASIX) 20 MG tablet, Take 1 tablet (20 mg total) by mouth every other day. May take extra dose or daily as needed if worsening swelling., Disp: 30 tablet, Rfl: 5 .  levothyroxine (SYNTHROID, LEVOTHROID) 88 MCG tablet, Take 2 tablets (176 mcg total) by mouth daily before breakfast., Disp: 180 tablet, Rfl: 3 .  lisinopril (PRINIVIL,ZESTRIL) 10 MG tablet, Take 1 tablet (10 mg total) by mouth daily., Disp: 30 tablet, Rfl: 11 .  meloxicam (MOBIC) 15 MG tablet, Take 1 tablet (15 mg total) by mouth daily as needed for pain., Disp: 30 tablet, Rfl: 3 .  metFORMIN (GLUCOPHAGE) 500 MG tablet, Take 1 tablet (500 mg total) by mouth 2 (two) times daily with a meal., Disp: 60 tablet, Rfl: 11 .  mineral oil-hydrophilic petrolatum (AQUAPHOR) ointment, Apply topically as needed for dry skin. Apply to  legs daily., Disp: 420 g, Rfl: 0 .  ondansetron (ZOFRAN ODT) 4 MG disintegrating tablet, Take 1 tablet (4 mg total) by mouth every 8 (eight) hours as needed for nausea or vomiting., Disp: 30 tablet, Rfl: 3 .  PARoxetine (PAXIL) 30 MG tablet, Take 1 tablet (30 mg total) by mouth daily., Disp: 30 tablet, Rfl: 11 .  ranitidine (ZANTAC) 150 MG tablet, Take 1 tablet (150 mg total) by mouth at bedtime., Disp: 30 tablet, Rfl: 11 .  traZODone (DESYREL) 100 MG tablet, Take 1 tablet (100 mg total) by mouth at bedtime., Disp: 30 tablet, Rfl:  11  Exam: Current vital signs: BP 119/61   Pulse (!) 106   Temp 100 F (37.8 C)   Resp (!) 21   Wt 103.3 kg (227 lb 11.8 oz)   SpO2 99%   BMI 35.67 kg/m  Vital signs in last 24 hours: Temp:  [100 F (37.8 C)] 100 F (37.8 C) (10/07 0840) Pulse Rate:  [104-106] 106 (10/07 0835) Resp:  [21-33] 21 (10/07 0835) BP: (119)/(61) 119/61 (10/07 0830) SpO2:  [98 %-100 %] 99 % (10/07 0835) Weight:  [103.3 kg (227 lb 11.8 oz)] 103.3 kg (227 lb 11.8 oz) (10/07 0839)  GENERAL: Awake, alert in NAD HEENT: - Right scalp hematoma over the eye, dry mm, no LN++, no Thyromegally LUNGS - scattered rales all over CV - S1S2 regular rhythm, no m/r/g, equal pulses bilaterally. Slightly tachycardic  ABDOMEN - Soft, nontender, nondistended with normoactive BS Ext:  venous stasis ulcers and bandages in both lower extremities, 2+ pitting edema edema. Bruising on the right knee and right leg  NEURO:  Mental Status: Patient is awake alert. Was able to turn his name. Upon asking his age, he said 66 years old. Could not tell me where he is currently. Neglecting the left side. Language: speech is severely dysarthric . He is extremely inattentive. Could not name repeat. Was able to follow commands intermittently Cranial Nerves: PERRL 11mm/brisk. Forced rightward gaze, visual fields  left hemianopsia, right lower facial weakness  Motor: 5/5 right upper and lower extremity. 3/5 left upper extremity. 3/5 left lower extremity. He kept his right hip flexed and had slightly increased tone in the right hip. Tone: Mostly normal with the exception of slightly increased tone versus stiffness of the right hip Sensation-decreased sensation on the left as evidenced by response to noxious stim Coordination: Couldn't perform Gait- deferred  NIHSS 1a Level of Conscious.: 0 1b LOC Questions: 1 1c LOC Commands: 0  2 Best Gaze: 2 3 Visual: 2 4 Facial Palsy: 1  5a Motor Arm - left: 3 5b Motor Arm - Right: 0  6a Motor Leg -  Left: 2 6b Motor Leg - Right: 0  7 Limb Ataxia: 0 8 Sensory: 1 9 Best Language: 1  10 Dysarthria: 2 11 Extinct. and Inatten.: 2  TOTAL: 17  Labs I have reviewed labs in epic and the results pertinent to this consultation are:  CBC    Component Value Date/Time   WBC 11.7 (H) 05/23/2017 0756   RBC 4.20 (L) 05/23/2017 0756   HGB 12.4 (L) 05/23/2017 0756   HCT 39.4 05/23/2017 0756   PLT 159 05/23/2017 0756   MCV 93.8 05/23/2017 0756   MCH 29.5 05/23/2017 0756   MCHC 31.5 05/23/2017 0756   RDW 17.8 (H) 05/23/2017 0756   LYMPHSABS 1.2 05/23/2017 0756   MONOABS 1.3 (H) 05/23/2017 0756   EOSABS 0.1 05/23/2017 0756   BASOSABS 0.0 05/23/2017 0756  CMP     Component Value Date/Time   NA 137 05/23/2017 0756   K 3.6 05/23/2017 0756   CL 99 (L) 05/23/2017 0756   CO2 24 05/23/2017 0756   GLUCOSE 134 (H) 05/23/2017 0756   BUN 49 (H) 05/23/2017 0756   BUN 14 11/04/2011 0800   CREATININE 2.09 (H) 05/23/2017 0756   CREATININE 1.69 (H) 05/20/2017 1052   CALCIUM 8.7 (L) 05/23/2017 0756   PROT 7.3 05/23/2017 0756   ALBUMIN 3.3 (L) 05/23/2017 0756   AST 39 05/23/2017 0756   ALT 19 05/23/2017 0756   ALKPHOS 214 (H) 05/23/2017 0756   BILITOT 1.0 05/23/2017 0756   GFRNONAA 31 (L) 05/23/2017 0756   GFRNONAA 41 (L) 05/20/2017 1052   GFRAA 36 (L) 05/23/2017 0756   GFRAA 48 (L) 05/20/2017 1052    Lipid Panel     Component Value Date/Time   CHOL 169 03/01/2017 1727   TRIG 162 (H) 03/01/2017 1727   HDL 51 03/01/2017 1727   CHOLHDL 3.3 03/01/2017 1727   VLDL 32 03/01/2017 1727   LDLCALC 86 03/01/2017 1727    Imaging I have reviewed the images obtained:  CT-scan of the brain - established hypodensity in the right MCA territory with loss of gray-white differentiation. No bleed. CT Angela head and neck and CT perfusion study showed Tmax >6 seconds of 84 mL and CV of less than 30% of 18 mL in the right MCA territory, with a mismatch volume of 66 mL. Decreased opacification of M2  and M3 branches on the right with no obvious thrombus.  Assessment:  66 year old man with a past medical history of diabetes hypertension hyperlipidemia depression hypothyroidism venous stasis ulcers and chronic diastolic heart failure brought in by [facility] for evaluation of left-sided weakness. He was unable to provide history. Unknown last seen normal. CT head findings suggestive of established hypodensity. CT angiogram unremarkable for large vessel occlusion that might be amenable for endovascular treatment. Not a candidate for TPA due to unknown last known normal as well as findings of CT head suggestive of an established hypodensity and possibly might indicate that he was outside the 4-1/2 hour window. Not a candidate for endovascular treatment because of proximal large vessel occlusion plus based on chart review possibly has a modified Rankin of 3 or 4.  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 At this time, I do not think he requires hemicraniectomy watch based on the size of his infarct on CT. An MRI would give [place] a better picture. HgbA1c, fasting lipid panel MRI brain without contrast Frequent neuro checks Echocardiogram Prophylactic therapy-Antiplatelet med: Aspirin - dose  PO or  PR Atorvastatin 80 Risk factor modification Telemetry monitoring PT consult, OT consult, Speech consult  Evaluation of her fracture and acute kidney injury and other comorbidities per ER and primary team.  Please page stroke NP  Or  PA  Or MD  from 8am -4 pm as this patient will be followed by the stroke team at this point.  You can look them up on [email or web address]     -- Milon Dikes, MD [facility] [phone]  If 7pm to 7am, please call on call as listed on AMION.  Critical care attestation This patient is critically ill and at  significant risk of neurological worsening, death and care requires constant monitoring of vital signs, hemodynamics,respiratory and cardiac monitoring, neurological assessment, discussion with  family, other specialists and medical decision making of high complexity. I spent 55  minutes of neurocritical care time  in the care of  this patient.

## 2017-05-27 NOTE — Progress Notes (Signed)
Patients friend Francia Greaves called and spoke with this RN. Stated he had a missed call from am IMTS MD about notifying patients next of kin. Per the patients friend the patient has no family to contact.

## 2017-05-27 NOTE — Progress Notes (Signed)
  Speech Language Pathology Treatment: Dysphagia;Cognitive-Linquistic  Patient Details Name: Clayton Green. MRN: [id number] DOB: 16-Apr-1951 Today's Date: 05/27/2017 Time: 4098-1191 SLP Time Calculation (min) (ACUTE ONLY): 12 min  Assessment / Plan / Recommendation Clinical Impression  Pt has no overt s/s of aspiration with current diet textures today. SLP provided Mod-Max cues for attempts at lingual sweeps and second swallows to work on oral clearance. He remains aphasic, with telegraphic speech and perseveration, although his intelligibility of speech seems to be improving. Would continue current diet for now.   HPI HPI: 66 year old man with a past medical history of diabetes hypertension hyperlipidemia depression hypothyroidism venous stasis ulcers and chronic diastolic heart failure brought in by [facility] for evaluation of left-sided weakness. CCT subacute/acute infarction right MCA territory affecting insula and frontoparietal region.  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        SLP Plan  Continue with current plan of care       Recommendations  Diet recommendations: Dysphagia 1 (puree);Pudding-thick liquid Liquids provided via: Teaspoon Medication Administration: Crushed with puree Supervision: Staff to assist with self feeding;Full supervision/cueing for compensatory strategies Compensations: Minimize environmental distractions;Slow rate;Small sips/bites;Lingual sweep for clearance of pocketing Postural Changes and/or Swallow Maneuvers: Seated upright 90 degrees;Upright 30-60 min after meal                Oral Care Recommendations: Oral care before and after PO Follow up Recommendations: Skilled Nursing facility SLP Visit Diagnosis: Dysphagia, oropharyngeal phase (R13.12);Aphasia (R47.01);Cognitive communication deficit (Y78.295) Plan: Continue with current plan of care       GO                Maxcine Ham 05/27/2017, 3:46 PM  Maxcine Ham, M.A. CCC-SLP [phone]

## 2017-05-27 NOTE — Interval H&P Note (Signed)
History and Physical Interval Note:  05/27/2017 2:05 PM  Clayton Green.  has presented today for surgery, with the diagnosis of stroke  The various methods of treatment have been discussed with the patient and family. After consideration of risks, benefits and other options for treatment, the patient has consented to  Procedure(s): TRANSESOPHAGEAL ECHOCARDIOGRAM (TEE) (N/A) as a surgical intervention .  The patient's history has been reviewed, patient examined, no change in status, stable for surgery.  I have reviewed the patient's chart and labs.  Questions were answered to the patient's satisfaction.     [name]

## 2017-05-27 NOTE — Progress Notes (Signed)
Occupational Therapy Treatment Patient Details Name: Clayton Green. MRN: [id number] DOB: 1951/04/14 Today's Date: 05/27/2017    History of present illness This 66 y.o. male admitted with Lt sided weakness, and communication deficits.  MRI of head showed:  Nonhemorrhagic multifocal infarcts bilateral anterior and posterior calculation, including left MCA, left MCA/PCA, right MCA, bilateral cerebellum, with the largest at right MCA. 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 PMH - DM, HTN, neuropathy, severe arthritis, CKD, depression.   OT comments  Pt appears to be tolerating splint today without evidence of pain.  Splint schedule established and posted.  Will continue to monitor.    Follow Up Recommendations  SNF    Equipment Recommendations  None recommended by OT    Recommendations for Other Services      Precautions / Restrictions Precautions Precautions: Fall       Mobility Bed Mobility                  Transfers                      Balance                                           ADL either performed or assessed with clinical judgement   ADL                                               Vision       Perception     Praxis      Cognition Arousal/Alertness: Awake/alert Behavior During Therapy: Flat affect Overall Cognitive Status: Impaired/Different from baseline Area of Impairment: Attention;Following commands;Problem solving                   Current Attention Level: Focused   Following Commands: Follows one step commands inconsistently;Follows one step commands with increased time     Problem Solving: Slow processing;Decreased initiation;Difficulty sequencing;Requires verbal cues;Requires tactile cues General Comments: Pt demonstrates significant deficits with initiation and with motor planning as well as communication deficits making it difficult to  accurately assess cognition         Exercises Other Exercises Other Exercises: Resting hand splint applied to Lt hand.  Pt indicates no pain today.   Splint appears to be fitting well.   Splint schedule established 4 hours on/4 hours off.   Nsg instructed and sign posted in room    Shoulder Instructions       General Comments      Pertinent Vitals/ Pain       Pain Assessment: Faces Faces Pain Scale: No hurt  Home Living                                          Prior Functioning/Environment              Frequency  Min 2X/week        Progress Toward Goals  OT Goals(current goals can now be found in the care plan section)  Progress towards OT goals: Progressing toward goals     Plan Discharge plan remains appropriate  Co-evaluation                 AM-PAC PT "6 Clicks" Daily Activity     Outcome Measure   Help from another person eating meals?: Total Help from another person taking care of personal grooming?: Total Help from another person toileting, which includes using toliet, bedpan, or urinal?: Total Help from another person bathing (including washing, rinsing, drying)?: Total Help from another person to put on and taking off regular upper body clothing?: Total Help from another person to put on and taking off regular lower body clothing?: Total 6 Click Score: 6    End of Session Equipment Utilized During Treatment: Oxygen  OT Visit Diagnosis: Hemiplegia and hemiparesis;Cognitive communication deficit (R41.841) Symptoms and signs involving cognitive functions: Cerebral infarction Hemiplegia - Right/Left: Left Hemiplegia - dominant/non-dominant: Non-Dominant Hemiplegia - caused by: Cerebral infarction   Activity Tolerance No increased pain   Patient Left in bed;with call bell/phone within reach;with bed alarm set   Nurse Communication Other (comment) (splint wear and care )        Time: 1610-9604 OT Time Calculation  (min): 13 min  Charges: OT General Charges $OT Visit: 1 Visit OT Treatments $Orthotics/Prosthetics Check: 8-22 mins  [employer], OTR/L [phone]    Jeani Hawking M 05/27/2017, 12:38 PM

## 2017-05-27 NOTE — Progress Notes (Signed)
Medicine attending: I examined this patient today together with resident physician Dr. Landis Martins and I concur with her evaluation and management plan which we discussed together. He is slowly improving status post major right MCA stroke with additional areas of involvement including in the distribution of the left MCA, PCA, right MCA, and bilateral cerebellum.  Suggestive of an embolic process.  He is in sinus rhythm.  Transthoracic echocardiogram with no gross atrial, ventricular, or valvular abnormalities.  He will get a transesophageal echocardiogram today. He is alert, oriented, can tell me his sister's name, where she lives, and her age.  I think he is perfectly capable of consenting to the TEE.  I discussed the procedure with him and the need for sedation.  He gave his consent.  We will proceed with the same. Lab profile today is stable. Anticipate discharge within the next 24-48 hours pending availability of a skilled nursing facility. We will try to make contact with his sister Onnie Hatchel.

## 2017-05-27 NOTE — Progress Notes (Signed)
Patient admitted to Endoscopy unit. Patient unable to give own consent. MD called to bedside to confirm. MD agrees to more patient's procedure to tomorrow to give time to obtain consent from family. Procedure MD spoke with resident about waiting on procedure. Called to inform bedside RN. Procedure rescheduled on 10.12.18 @ 1500.

## 2017-05-27 NOTE — Clinical Social Work Note (Signed)
Clinical Social Work Assessment  Patient Details  Name: Clayton Green. MRN: [id number] Date of Birth: 08/21/1950  Date of referral:  05/27/17               Reason for consult:  Facility Placement                Permission sought to share information with:  Facility Industrial/product designer granted to share information::  Yes, Verbal Permission Granted  Name::        Agency::  SNF  Relationship::     Contact Information:     Housing/Transportation Living arrangements for the past 2 months:  Apartment Source of Information:  Patient Patient Interpreter Needed:  None Criminal Activity/Legal Involvement Pertinent to Current Situation/Hospitalization:  No - Comment as needed Significant Relationships:  Friend Lives with:  Self Do you feel safe going back to the place where you live?  No Need for family participation in patient care:  No (Coment)  Care giving concerns:  Pt lives at home and received aid from caregivers.  Pt currently with severe impairment following stroke and not safe to return home with available assistance.   Social Worker assessment / plan: Pt is alert and oriented but only spoke a couple of words at a time due to current impairments after stroke.   CSW spoke with pt about PT recommendation for SNF.  Pt has been to SNF twice recently and is very familiar with SNF and SNF referral process.  Employment status:  Retired Health and safety inspector:  Armed forces operational officer, Medicaid In [employer] PT Recommendations:  Skilled Nursing Facility Information / Referral to community resources:  Skilled Nursing Facility  Patient/Family's Response to care:  Agreeable to SNF- prefers [facility] where he has been before.  Patient/Family's Understanding of and Emotional Response to Diagnosis, Current Treatment, and Prognosis:  Pt expresses good understanding of recommendations but is not able to express his needs well right now due to speech deficits.  Emotional  Assessment Appearance:  Appears stated age Attitude/Demeanor/Rapport:    Affect (typically observed):  Appropriate, Flat Orientation:  Oriented to Self, Oriented to Place, Oriented to  Time, Oriented to Situation Alcohol / Substance use:  Not Applicable Psych involvement (Current and /or in the community):  No (Comment)  Discharge Needs  Concerns to be addressed:  Care Coordination Readmission within the last 30 days:  No Current discharge risk:  Physical Impairment Barriers to Discharge:  Continued Medical Work up   [name], LCSW 05/27/2017, 4:08 PM

## 2017-05-27 NOTE — Progress Notes (Signed)
STROKE TEAM PROGRESS NOTE   SUBJECTIVE (INTERVAL HISTORY) No family at bedside. Peer care agent(?) is at bedside inquiring about his progress today. Mental status remains pretty good with partial aphasia and perseveration mostly  "yes". He is NPO this morning in anticipation for TEE, however it was alter rescheduled to tomorrow due to lack of consent. Participating with physical therapy and recommended for SNF level of care.   OBJECTIVE Temp:  [98.1 F (36.7 C)-99.5 F (37.5 C)] 98.7 F (37.1 C) (10/11 1154) Pulse Rate:  [58-105] 79 (10/11 1154) Cardiac Rhythm: Normal sinus rhythm (10/11 0800) Resp:  [18-30] 30 (10/11 1154) BP: (115-146)/(56-88) 132/83 (10/11 1154) SpO2:  [95 %-100 %] 95 % (10/11 1154)   Recent Labs Lab 05/26/17 1931 05/26/17 2340 05/27/17 0340 05/27/17 0738 05/27/17 1152  GLUCAP 133* 106* 104* 96 102*    Recent Labs Lab 05/24/17 0351 05/25/17 0222 05/26/17 0449 05/27/17 0450 05/27/17 0850  NA 142 141 142 140 141  K 3.2* 3.7 4.1 4.1 4.2  CL 105 108 111 110 110  CO2 GLUCOSE 124* 141* 124* 115* 110*  BUN 39* 29* 21* 17 15  CREATININE 1.68* 1.17 0.96 0.90 0.86  CALCIUM 8.2* 8.3* 8.1* 8.2* 8.5*  MG  --  1.8  --   --   --     Recent Labs Lab 05/23/17 0756 05/24/17 0351  AST 39 33  ALT 19 15*  ALKPHOS 214* 170*  BILITOT 1.0 1.2  PROT 7.3 6.0*  ALBUMIN 3.3* 2.6*    Recent Labs Lab 05/23/17 0756 05/24/17 0351 05/25/17 0222 05/26/17 0449 05/27/17 0450  WBC 11.7* 8.3 8.5 7.7 8.9  NEUTROABS 9.1*  --   --   --   --   HGB 12.4* 10.3* 10.2* 10.1* 10.7*  HCT 39.4 33.2* 33.4* 33.2* 34.9*  MCV 93.8 93.0 94.6 95.4 95.4  PLT 159 144* 156 162 185    Recent Labs Lab 05/23/17 0756 05/23/17 1410 05/23/17 2136 05/24/17 0738  TROPONINI 0.21* 0.20* 0.25* 0.27*    Recent Labs  05/27/17 0850  LABPROT 13.4  INR 1.02   No results for input(s): COLORURINE, LABSPEC, PHURINE, GLUCOSEU, HGBUR, BILIRUBINUR, KETONESUR, PROTEINUR,  UROBILINOGEN, NITRITE, LEUKOCYTESUR in the last 72 hours.  Invalid input(s): APPERANCEUR     Component Value Date/Time   CHOL 110 05/24/2017 0351   TRIG 92 05/24/2017 0351   HDL 49 05/24/2017 0351   CHOLHDL 2.2 05/24/2017 0351   VLDL 18 05/24/2017 0351   LDLCALC 43 05/24/2017 0351   Lab Results  Component Value Date   HGBA1C 5.9 (H) 05/24/2017   No results found for: LABOPIA, COCAINSCRNUR, LABBENZ, AMPHETMU, THCU, LABBARB  No results for input(s): ETH in the last 168 hours.  I have personally reviewed the radiological images below and agree with the radiology interpretations.  CTA Head and neck and CTP W Or Wo Contrast 05/23/2017 IMPRESSION: Missing M2 to M3 branch on the right without evidence of a retrievable proximal thrombus. This corresponds to the region of infarction demonstrated and noncontrast CT and for fusion. No upstream vascular lesion seen to predispose to stroke. Minimal atherosclerotic changes without stenosis or irregularity. Perfusion imaging shows complete infarction of 18 cc in the insular and posterior frontal region. Surrounding region of diminished for fusion is calculated at 66 cc, but this is probably exaggerated based on inclusion of some left ACA territory brain, which I think is probably normal based on the vascular imaging.   Mr Brain Clayton Green  Contrast 05/23/2017 IMPRESSION: 1. Multifocal nonhemorrhagic infarcts involving anterior and posterior circulation bilaterally. This suggests a central embolic source. 2. The largest area of infarction involves the right MCA territory, specifically the right frontal operculum and insular cortex. 3. Confluent area of acute nonhemorrhagic infarct involving the posterior left parietal lobe and occipital lobe. 4. Additional punctate foci of acute infarction throughout the posterior circulation bilaterally. 5. Remote encephalomalacia involving the posterior left temporal and occipital lobe.   Ct Head Code Stroke Wo  Contrast 05/23/2017 IMPRESSION: 1. Acute/subacute infarction in the right middle cerebral artery territory with mild swelling but no hemorrhage. I would estimate that this is greater than 8 hours old given these findings. Region of infarction measures approximately 6 cm. 2. ASPECTS is 6   2D Echocardiogram   - Technically difficult; definity used; normal LV systolic function; mild diastolic dysfunction; calcified aortic valve with mild AS (mean gradient 11 mmHg); mild LAE; trace TR with moderate elevation of pulmonary pressure.  LE venous doppler 05/24/2017 Technically challenging due to pain in patient's extremities related to chronic wounds No DVT, positive for superficial thrombus in R greater saphenous vein  CXR 05/28/17  Mild bibasilar subsegmental atelectasis or inflammation, with minimal right pleural effusion.  TEE pending    PHYSICAL EXAM  Temp:  [98.1 F (36.7 C)-99.5 F (37.5 C)] 98.7 F (37.1 C) (10/11 1154) Pulse Rate:  [58-105] 79 (10/11 1154) Resp:  [18-30] 30 (10/11 1154) BP: (115-146)/(56-88) 132/83 (10/11 1154) SpO2:  [95 %-100 %] 95 % (10/11 1154)  General - Obese, in no apparent distress.  Mental Status -  Oriented to person, place, month and year Language exam showed significant expressive aphasia, able to answer orientation questions appropriately, follow limited verbal commands, able to speak phrases, severe perseveration. Moderate dysarthria.  Cranial Nerves II - XII - II - Reacts to visual threat on both sides III, IV, VI - Extraocular movements exam right gaze preference but he crosses midline to command. V - Facial sensation intact bilaterally. VII - left facial droop. VIII - Hearing grossly intact. X - Palate elevates symmetrically, moderate dysarthria. XII - Tongue protrusion midline.  Motor Strength - 4/5 strength on R sided extremities, 0/5 left extremities although has reflexive withdrawal in LLE.   Tone - Left extremities upper and lower  with significantly increased resting tone  Reflexes - Positive babinksi on LLE, withdraws LLE to pain but no movement to command  Sensation, coordination not cooperative and gait not tested.    ASSESSMENT/PLAN [name]. is a 66 y.o. male with history of DM, HTN, HLD, hypothyroidism, bilateral lower extremity venous stasis, CHF admitted for slurry speech, left-sided weakness and urinary incontinence. Patient has recent fall with scalp hematoma.     Stroke:  Multifocal infarcts bilateral anterior and posterior circulation, consistent with cardioembolic infarct. Etiology unclear.   MRI  multifocal infarcts bilateral anterior and posterior calculation, including left MCA, left MCA/PCA, right MCA, bilateral cerebellum, with the largest at right MCA. 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  CTA head and neck - questionable right M2/M3 missing branches, no LVO.  2D Echo  EF 55-60%, unremarkable  LE venous Doppler no DVT  Recommend TEE and loop recorder to evaluate cardioembolic source. Re-schedule 10/12  LDL 43  HgbA1c 5.9  Heparin subcutaneous for VTE prophylaxis  Diet NPO time specified Except for: Sips with Meds   aspirin 81 mg daily prior to admission, now on aspirin 325 mg daily  Patient counseled to be  compliant with his antithrombotic medications  Ongoing aggressive stroke risk factor management  Therapy recommendations:  SNF  Disposition:  Pending  UTI  One-time Spiking fever with tachycardia, now resolved  On bactrim   UA WBC TNTC  Urine culture E. Coli - CTX resistant  Blood culture NGTD  Diabetes  HgbA1c 5.9 goal < 7.0  controlled  CBG monitoring  SSI  Hypertension Home meds Lasix and lisinopril BP normotensive  Stable  Hyperlipidemia  Home meds:  Lipitor 20   LDL 43, goal < 70  Resumed Lipitor 20   Continue statin at discharge  Other Stroke Risk Factors  Advanced age  Obesity  CHF on lasix  and lisinopril at home  Other Active Problems  Hypothyroidism with elevated TSH -on Synthroid   Bilateral LE wounds with dressing due to chronic venous stasis    Hospital day # 4   Fuller Plan, MD PGY-III Internal Medicine Resident Pager# [phone] 05/27/2017, 1:11 PM   I reviewed above note and agree with the assessment and plan.  He is scheduled for TEE today for embolic pattern of infarct. He would benefit from anticoagulation for secondary prevention of recurrent stroke if thrombus or Afib identified. Therapy is participating and recommending SNF level care at discharge. His language is improving slowly with significant perseveration. No indication from neurological standpoint for ongoing stepdown care needed given his alertness.  Marvel Plan, MD PhD Stroke Neurology 05/27/2017 4:07 PM     To contact Stroke Continuity provider, please refer to [email or web address]. After hours, contact General Neurology

## 2017-05-27 NOTE — Progress Notes (Signed)
Subjective: Patient seen and examined. Communication deficits slowly improving. Continues to follow simple commands intermittently and is more aware of his left side. His responses to questions are occasionally appropriate, but continues to respond "yes" to most questions. The patient is AOx3. Was able to give verbal consent for TEE procedure. Denies pain today.   Objective:  Vital signs in last 24 hours: Vitals:   05/27/17 0000 05/27/17 0336 05/27/17 0400 05/27/17 0738  BP: 115/64 133/78 (!) 121/56 117/65  Pulse: 66 95 (!) 58 100  Resp: (!) 23 (!) 24 20 (!) 28  Temp: 98.5 F (36.9 C) 98.7 F (37.1 C) 98.7 F (37.1 C) 98.1 F (36.7 C)  TempSrc: Axillary Axillary Axillary Oral  SpO2: 98% 96%  100%  Weight:      Height:       General: Sitting up in bed , NAD HEENT: [place], left infraorbital bruising, midline forehead abrasion, right sided gaze preference leftward saccades, cannot track Cardiac: RRR, No R/M/G appreciated Pulm: normal effort, CTAB  Abd: soft, non tender, non distended, BS normal Ext: extremities with chronic venous stasis changes, left LE wrapped with clean and dry bandages, right LE without oozing ulceration, bruising present on right knee   Neuro: alert and oriented X3 (person, [place], 2018), right sided gaze preference leftward saccades, left sided facial droop, dysarthric speech, strength 4/5 on right upper and 3/5 right lower extremity, left upper and lower extremity 0/5, lower extremities remain contracted and rigid, upward Babinski on left   Assessment/Plan:  Principal Problem:   Acute ischemic right MCA stroke (HCC) Active Problems:   Right sided cerebral hemisphere cerebrovascular accident (CVA) (HCC)   AKI (acute kidney injury) (HCC)   Acute cystitis with hematuria   Controlled type 2 diabetes mellitus with hyperglycemia, without long-term current use of insulin (HCC)   Cerebrovascular accident (CVA) due to embolism of right middle cerebral artery  (HCC)   Pressure injury of skin   E. coli UTI (urinary tract infection)  Right MCA CVA Patient with acute onset of left sided weakness, left facial droop, dysarthria, right sided gaze preference of unknown duration so tPA was not adminstered. MRI showed multifocal nonhemorrhagic infarcts involving anterior and posterior circulation bilaterally, largest area of infarction involving R MCA territory, suggesting a central embolic source. Patient remains hemodynamically stable and is protecting his airway, sating well on 1.5 L  . Physical exam remains unchanged, patient seemed more aware of his left side today.  PT/OT recommend SNF. TEE today.  -Neurology following appreciate recs -TEE today, f/u results -Telemetry -Q2 hour neuro checks -ASA 325 mg daily, Lipitor 20 mg  -PT/OT, Speech therapy, Nutritional management following; appreciate recs  UTI Urine culture + for ESBL resistant E. Coli. Afebrile without leukocytosis.  -Meropenem 1 g q8 hours -Day 4 of abx, Day 3 of Meropenem -Plans to switch to Bactrim today -Bactrim 20 mL solution Q12   Type 2 DM A1C 5.9, well controlled. Complicated by neuropathy. CBG WNLs. -On metformin 500 mg BID at home -Insulin SS TID with meals    HTN, HLD BP 117/65 .  -Holding HTN Lisinopril 20 mg   Hypothryoidsim Post surgical hypothyroidism. TSH elevated at 7.525.  -Synthroid 176 mcg QD crushed with pudding   Chronic venous insufficiency Patient with +1 lower extremity edema and bilateral venous ulcerations. 2 venous ulcerations on the LLE, small open area on the dorsal right foot. Both ulcerations on the LLE are clean, bloody and oozing, but not purulent. Right dorsal foot 100%  yellow but not purulent.  -Wound care recommendations relayed to nursing; will re-consult if needed   F: 100 cc/hr NS E: Repletion as needed N: Dys 1 PuddingThick  DVT prophylaxis: SQ heparin   Dispo: Anticipated discharge in approximately 3-4 days.   Toney Rakes, MD 05/27/2017, 10:58 AM Pager: [phone]

## 2017-05-28 ENCOUNTER — Encounter (HOSPITAL_COMMUNITY)
Admission: EM | Disposition: A | Discharge status: Skilled Nursing Facility | Payer: Self-pay | Source: Home / Self Care | Attending: Oncology

## 2017-05-28 ENCOUNTER — Inpatient Hospital Stay (HOSPITAL_COMMUNITY): Payer: Medicare Other

## 2017-05-28 LAB — CULTURE, BLOOD (ROUTINE X 2)
CULTURE: NO GROWTH
Culture: NO GROWTH
Special Requests: ADEQUATE
Special Requests: ADEQUATE

## 2017-05-28 LAB — CBC
HCT: 38.1 % — ABNORMAL LOW (ref 39.0–52.0)
HEMOGLOBIN: 11.7 g/dL — AB (ref 13.0–17.0)
MCH: 29 pg (ref 26.0–34.0)
MCHC: 30.7 g/dL (ref 30.0–36.0)
MCV: 94.5 fL (ref 78.0–100.0)
Platelets: 208 10*3/uL (ref 150–400)
RBC: 4.03 MIL/uL — AB (ref 4.22–5.81)
RDW: 18.5 % — ABNORMAL HIGH (ref 11.5–15.5)
WBC: 11.6 10*3/uL — ABNORMAL HIGH (ref 4.0–10.5)

## 2017-05-28 LAB — BASIC METABOLIC PANEL
Anion gap: 10 (ref 5–15)
BUN: 13 mg/dL (ref 6–20)
CHLORIDE: 106 mmol/L (ref 101–111)
CO2: 23 mmol/L (ref 22–32)
CREATININE: 0.89 mg/dL (ref 0.61–1.24)
Calcium: 8.7 mg/dL — ABNORMAL LOW (ref 8.9–10.3)
Glucose, Bld: 111 mg/dL — ABNORMAL HIGH (ref 65–99)
POTASSIUM: 4.4 mmol/L (ref 3.5–5.1)
SODIUM: 139 mmol/L (ref 135–145)

## 2017-05-28 LAB — GLUCOSE, CAPILLARY
GLUCOSE-CAPILLARY: 104 mg/dL — AB (ref 65–99)
GLUCOSE-CAPILLARY: 140 mg/dL — AB (ref 65–99)
GLUCOSE-CAPILLARY: 150 mg/dL — AB (ref 65–99)
GLUCOSE-CAPILLARY: 89 mg/dL (ref 65–99)
Glucose-Capillary: 104 mg/dL — ABNORMAL HIGH (ref 65–99)
Glucose-Capillary: 119 mg/dL — ABNORMAL HIGH (ref 65–99)

## 2017-05-28 SURGERY — ECHOCARDIOGRAM, TRANSESOPHAGEAL
Anesthesia: Moderate Sedation

## 2017-05-28 MED ORDER — SODIUM CHLORIDE 0.9 % IV SOLN
INTRAVENOUS | Status: DC
  Administered 2017-05-28 – 2017-05-30 (×6): via INTRAVENOUS

## 2017-05-28 NOTE — Progress Notes (Signed)
Physical Therapy Treatment Patient Details Name: Clayton Green. MRN: [id number] DOB: [birth year] Today's Date: 05/28/2017    History of Present Illness This 66 y.o. male admitted with Lt sided weakness, and communication deficits.  MRI of head showed:  Nonhemorrhagic multifocal infarcts bilateral anterior and posterior calculation, including left MCA, left MCA/PCA, right MCA, bilateral cerebellum, with the largest at right MCA. 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 PMH - DM, HTN, neuropathy, severe arthritis, CKD, depression.    PT Comments    Pt making slow progress. Severe extensor tone makes all mobility very difficult.   Follow Up Recommendations  SNF     Equipment Recommendations  Other (comment) (to be assessed at next venue)    Recommendations for Other Services       Precautions / Restrictions Precautions Precautions: Fall Restrictions Weight Bearing Restrictions: No    Mobility  Bed Mobility Overal bed mobility: Needs Assistance Bed Mobility: Sit to Supine;Supine to Sit     Supine to sit: +2 for physical assistance;Total assist Sit to supine: Total assist;+2 for physical assistance   General bed mobility comments: Pt with severe extensor tone making getting into sitting EOB very difficult requiring assist with all aspects. Pt with pusher syndrome.   Transfers                 General transfer comment: unable to safely attempt   Ambulation/Gait                 Stairs            Wheelchair Mobility    Modified Rankin (Stroke Patients Only)       Balance Overall balance assessment: Needs assistance Sitting-balance support: Single extremity supported;Feet unsupported Sitting balance-Leahy Scale: Zero Sitting balance - Comments: Pt with heavy extensor tone and unable to flex hips, trunk, knees, or head enough to achieve sitting with 90 degree hip/trunk angle. Had to hold pt's rt hand to prevent  pushing. On EOB pt's BLE's sticking out with knees extended and feet off of the floor despite verbal cues and inhibition of spasticity. Attempted to get pt to bring trunk anteriorly by reaching with RUE. Pt sat EOB x 7-8 minutes.  Postural control: Posterior lean;Left lateral lean                                  Cognition Arousal/Alertness: Awake/alert Behavior During Therapy: Flat affect Overall Cognitive Status: Impaired/Different from baseline Area of Impairment: Attention;Following commands;Problem solving                   Current Attention Level: Focused   Following Commands: Follows one step commands inconsistently;Follows one step commands with increased time     Problem Solving: Slow processing;Decreased initiation;Difficulty sequencing;Requires verbal cues;Requires tactile cues General Comments: Pt demonstrates significant deficits with initiation and with motor planning as well as communication deficits making it difficult to accurately assess cognition       Exercises      General Comments        Pertinent Vitals/Pain Pain Assessment: No/denies pain    Home Living                      Prior Function            PT Goals (current goals can now be found in the care plan section) Progress towards PT goals:  Progressing toward goals    Frequency    Min 2X/week      PT Plan Current plan remains appropriate    Co-evaluation              AM-PAC PT "6 Clicks" Daily Activity  Outcome Measure  Difficulty turning over in bed (including adjusting bedclothes, sheets and blankets)?: Unable Difficulty moving from lying on back to sitting on the side of the bed? : Unable Difficulty sitting down on and standing up from a chair with arms (e.g., wheelchair, bedside commode, etc,.)?: Unable Help needed moving to and from a bed to chair (including a wheelchair)?: Total Help needed walking in hospital room?: Total Help needed  climbing 3-5 steps with a railing? : Total 6 Click Score: 6    End of Session   Activity Tolerance: Patient tolerated treatment well Patient left: in bed;with call bell/phone within reach;with bed alarm set Nurse Communication: Mobility status PT Visit Diagnosis: Other abnormalities of gait and mobility (R26.89);Difficulty in walking, not elsewhere classified (R26.2);Apraxia (R48.2);Hemiplegia and hemiparesis Hemiplegia - Right/Left: Left Hemiplegia - dominant/non-dominant: Dominant Hemiplegia - caused by: Cerebral infarction     Time: 4098-1191 PT Time Calculation (min) (ACUTE ONLY): 18 min  Charges:  $Therapeutic Activity: 8-22 mins                    G Codes:       [facility] PT [phone]    Angelina Ok [facility] 05/28/2017, 9:40 AM

## 2017-05-28 NOTE — Progress Notes (Signed)
Subjective: Patient seen and examined. Communication deficits stable. Follows most simple commands and seems more aware of his left side. His responses to questions are appropriate, but continues to respond "yes" to most questions. Complaining of thirst and pain in his legs.   Objective:  Vital signs in last 24 hours: Vitals:   05/27/17 2300 05/28/17 0000 05/28/17 0300 05/28/17 0736  BP: 135/85 130/74 (!) 148/113 121/68  Pulse: 89 78 96 100  Resp: (!) 24 20 (!) 24 (!) 29  Temp: 98.4 F (36.9 C) 98.4 F (36.9 C) 98 F (36.7 C) 98.2 F (36.8 C)  TempSrc: Oral  Oral Oral  SpO2: 96%  98% 96%  Weight:      Height:       General: Sitting up in bed , NAD HEENT: [place], left infraorbital bruising, midline forehead abrasion, right sided gaze preference leftward saccades, cannot track Cardiac: RRR, No R/M/G appreciated Pulm: normal effort, CTAB  Abd: soft, non tender, non distended, BS normal Ext: extremities with chronic venous stasis changes, left LE wrapped with clean and dry bandages, right LE without oozing ulceration, bruising present on right knee   Neuro: alert and oriented X3, right sided gaze preference leftward saccades, left sided facial droop, dysarthric speech, strength 4/5 on right upper and 3/5 right lower extremity, left upper and lower extremity 0/5, lower extremities remain contracted and rigid, upward Babinski on left   Assessment/Plan:  Principal Problem:   Acute ischemic stroke (HCC) Active Problems:   Right sided cerebral hemisphere cerebrovascular accident (CVA) (HCC)   AKI (acute kidney injury) (HCC)   Acute cystitis with hematuria   Controlled type 2 diabetes mellitus with hyperglycemia, without long-term current use of insulin (HCC)   Cerebrovascular accident (CVA) due to embolism of cerebral artery (HCC)   Pressure injury of skin   E. coli UTI (urinary tract infection)  Right MCA CVA Patient with acute onset of left sided weakness, left facial droop,  dysarthria, right sided gaze preference of unknown duration so tPA was not adminstered. MRI showed multifocal nonhemorrhagic infarcts involving anterior and posterior circulation bilaterally, largest area of infarction involving R MCA territory, suggesting a central embolic source. Patient remains hemodynamically stable and is protecting his airway, sating well on 1.5 L  [place]. Physical exam and communication ability remains unchanged. Did not get TEE yesterday due to concerns for ethical consent. The patient has no family members or designated POA. Tried contacting the patient's PCP Dr. Althea Charon to discuss his baseline mental status and cognitive ability. The patient's PCPs office is closed today and I was unable to get a hold of him. Per chart review there is no documentation that the patient had an underlying intellectual disability and was unable to make medical decisions. Talked to the patients peer support contact from [employer], who states that the patient had the capacity to make medical decisions on his own and had no intellectual disability that would prevent him from doing so. Peer support also indicated that this patient does not have any next of kin or living family members. Received the number for a friend, will try reaching out.  -Neurology following appreciate recs -TEE possibly later today pending consent?, f/u results -Telemetry -Q2 hour neuro checks -ASA 325 mg daily, Lipitor 20 mg  -PT/OT, Speech therapy, Nutritional management following; appreciate recs  Leukocytosis Pt today with leukocytosis of 11.4. Blood cultures with NGTD x 4 days. Currently afebrile with stable vital signs. Will monitor closely.   -CXR  -CBC  in AM   UTI Urine culture + for ESBL resistant E. Coli. Received 1 day of ceftriaxone, 3 days of meropenem, and is now on Bactrim. Total days of abx = 5, Total days of abx with appropriate coverage = 4 - Continue Bactrim 20 mL solution Q12  Type 2  DM A1C 5.9, well controlled. Complicated by neuropathy. CBG WNLs. -On metformin 500 mg BID at home -Insulin SS TID with meals   HTN, HLD BP 121/68.  -Holding HTN Lisinopril 20 mg   Hypothryoidsim Post surgical hypothyroidism. TSH elevated at 7.525.  -Synthroid 176 mcg QD crushed with pudding   Chronic venous insufficiency Patient with +1 lower extremity edema and bilateral venous ulcerations. 2 venous ulcerations on the LLE, small open area on the dorsal right foot. Both ulcerations on the LLE are clean, bloody and oozing, but not purulent. Right dorsal foot 100% yellow but not purulent.  -Wound care recommendations relayed to nursing; will re-consult if needed   F: 100 cc/hr NS E: Repletion as needed N: Dys 1 PuddingThick  DVT prophylaxis: SQ heparin   Dispo: Anticipated discharge in approximately 3-4 days.   Toney Rakes, MD 05/28/2017, 12:02 PM Pager: [phone]

## 2017-05-28 NOTE — Progress Notes (Signed)
I saw and examined Clayton Green after further discussion with Dr. Roda Shutters. His case was cancelled yesterday by Dr. Royann Shivers who felt he could not consent for the procedure. Unfortunately, I do not feel he is a candidate for TEE either. He was presented with options and he wished to do both, but could not relate the different risks of either and was certainly perseverant about saying yes to multiple questions. He also has significant neck spasm/increased tone and this will not allow safe passage of the TEE probe. Additionally, he could not reliably convince me that he understands the risks/benefits or alternatives to TEE. There is no family to contact on his behalf - he has a Network engineer, but he does not have power of attorney. There is a sister who is in a nursing home but could not be reached. I think the risk/benefit ratio is not favorable at this time for the procedure. If there is good evidence that this is an embolic stroke, he could be empirically anticoagulated. Otherwise, would recommend arranging a 30 day monitor through our office to look for occult a-fib.  Chrystie Nose, MD, [facility]  [place]  [facility]  Attending Cardiologist  Direct Dial: [phone]  Fax: [phone]  Website:  [email or web address]

## 2017-05-28 NOTE — Progress Notes (Addendum)
STROKE TEAM PROGRESS NOTE   SUBJECTIVE (INTERVAL HISTORY) No family at bedside. Pt awake alert, no acute event overnight. TEE planned for today. No significant neuro changes.    OBJECTIVE Temp:  [97.5 F (36.4 C)-99.1 F (37.3 C)] 98 F (36.7 C) (10/12 0300) Pulse Rate:  [71-107] 96 (10/12 0300) Cardiac Rhythm: Normal sinus rhythm (10/11 2000) Resp:  [20-32] 24 (10/12 0300) BP: (117-148)/(65-113) 148/113 (10/12 0300) SpO2:  [95 %-100 %] 98 % (10/12 0300)   Recent Labs Lab 05/27/17 1152 05/27/17 1602 05/27/17 1919 05/27/17 2327 05/28/17 0323  GLUCAP 102* 119* 100* 101* 104*    Recent Labs Lab 05/25/17 0222 05/26/17 0449 05/27/17 0450 05/27/17 0850 05/28/17 0453  NA 141 142 140 141 139  K 3.7 4.1 4.1 4.2 4.4  CL 108 111 110 110 106  CO2 GLUCOSE 141* 124* 115* 110* 111*  BUN 29* 21* CREATININE 1.17 0.96 0.90 0.86 0.89  CALCIUM 8.3* 8.1* 8.2* 8.5* 8.7*  MG 1.8  --   --   --   --     Recent Labs Lab 05/23/17 0756 05/24/17 0351  AST 39 33  ALT 19 15*  ALKPHOS 214* 170*  BILITOT 1.0 1.2  PROT 7.3 6.0*  ALBUMIN 3.3* 2.6*    Recent Labs Lab 05/23/17 0756 05/24/17 0351 05/25/17 0222 05/26/17 0449 05/27/17 0450 05/28/17 0453  WBC 11.7* 8.3 8.5 7.7 8.9 11.6*  NEUTROABS 9.1*  --   --   --   --   --   HGB 12.4* 10.3* 10.2* 10.1* 10.7* 11.7*  HCT 39.4 33.2* 33.4* 33.2* 34.9* 38.1*  MCV 93.8 93.0 94.6 95.4 95.4 94.5  PLT 159 144* 156 162 185 208    Recent Labs Lab 05/23/17 0756 05/23/17 1410 05/23/17 2136 05/24/17 0738  TROPONINI 0.21* 0.20* 0.25* 0.27*    Recent Labs  05/27/17 0850  LABPROT 13.4  INR 1.02   No results for input(s): COLORURINE, LABSPEC, PHURINE, GLUCOSEU, HGBUR, BILIRUBINUR, KETONESUR, PROTEINUR, UROBILINOGEN, NITRITE, LEUKOCYTESUR in the last 72 hours.  Invalid input(s): APPERANCEUR     Component Value Date/Time   CHOL 110 05/24/2017 0351   TRIG 92 05/24/2017 0351   HDL 49 05/24/2017 0351    CHOLHDL 2.2 05/24/2017 0351   VLDL 18 05/24/2017 0351   LDLCALC 43 05/24/2017 0351   Lab Results  Component Value Date   HGBA1C 5.9 (H) 05/24/2017   No results found for: LABOPIA, COCAINSCRNUR, LABBENZ, AMPHETMU, THCU, LABBARB  No results for input(s): ETH in the last 168 hours.  I have personally reviewed the radiological images below and agree with the radiology interpretations.  CTA Head and neck and CTP W Or Wo Contrast 05/23/2017 IMPRESSION: Missing M2 to M3 branch on the right without evidence of a retrievable proximal thrombus. This corresponds to the region of infarction demonstrated and noncontrast CT and for fusion. No upstream vascular lesion seen to predispose to stroke. Minimal atherosclerotic changes without stenosis or irregularity. Perfusion imaging shows complete infarction of 18 cc in the insular and posterior frontal region. Surrounding region of diminished for fusion is calculated at 66 cc, but this is probably exaggerated based on inclusion of some left ACA territory brain, which I think is probably normal based on the vascular imaging.   Mr Brain Wo Contrast 05/23/2017 IMPRESSION: 1. Multifocal nonhemorrhagic infarcts involving anterior and posterior circulation bilaterally. This suggests a central embolic source. 2. The largest area of infarction involves the right MCA territory,  specifically the right frontal operculum and insular cortex. 3. Confluent area of acute nonhemorrhagic infarct involving the posterior left parietal lobe and occipital lobe. 4. Additional punctate foci of acute infarction throughout the posterior circulation bilaterally. 5. Remote encephalomalacia involving the posterior left temporal and occipital lobe.   Ct Head Code Stroke Wo Contrast 05/23/2017 IMPRESSION: 1. Acute/subacute infarction in the right middle cerebral artery territory with mild swelling but no hemorrhage. I would estimate that this is greater than 8 hours old given these findings.  Region of infarction measures approximately 6 cm. 2. ASPECTS is 6   2D Echocardiogram   - Technically difficult; definity used; normal LV systolic function; mild diastolic dysfunction; calcified aortic valve with mild AS (mean gradient 11 mmHg); mild LAE; trace TR with moderate elevation of pulmonary pressure.  LE venous doppler 05/24/2017 Technically challenging due to pain in patient's extremities related to chronic wounds No DVT, positive for superficial thrombus in R greater saphenous vein  CXR 05/28/17  Mild bibasilar subsegmental atelectasis or inflammation, with minimal right pleural effusion.  TEE pending    PHYSICAL EXAM  Temp:  [97.5 F (36.4 C)-99.1 F (37.3 C)] 98 F (36.7 C) (10/12 0300) Pulse Rate:  [71-107] 96 (10/12 0300) Resp:  [20-32] 24 (10/12 0300) BP: (117-148)/(65-113) 148/113 (10/12 0300) SpO2:  [95 %-100 %] 98 % (10/12 0300)  General - Obese, in no apparent distress.  Mental Status -  Oriented to person, place, month and year Language exam showed partial expressive aphasia, able to answer orientation questions appropriately, follow simple commands, anomia but able to speak phrases, and repeat short sentences, severe perseveration. Moderate dysarthria. Left neglect.  Cranial Nerves II - XII - II - not blinking to visual threat on the left III, IV, VI - PERRL, right gaze preference but he crosses midline to command. V - Facial sensation intact bilaterally. VII - left facial droop. VIII - Hearing grossly intact. X - Palate elevates symmetrically, moderate dysarthria. XII - Tongue protrusion midline.  Motor Strength - 4/5 strength on R sided extremities, 0/5 left extremities although has reflexive withdrawal in LLE.   Tone - Left extremities upper and bilateral lower with significantly increased resting tone (? baseline wheelchair and incontinent)  Reflexes - Positive babinksi on LLE  Sensation, coordination not cooperative and gait not  tested.    ASSESSMENT/PLAN [name]. is a 66 y.o. male with history of DM, HTN, HLD, hypothyroidism, bilateral lower extremity venous stasis, CHF admitted for slurry speech, left-sided weakness and urinary incontinence. Patient has recent fall with scalp hematoma.     Stroke:  Multifocal infarcts bilateral anterior and posterior circulation, consistent with cardioembolic infarct. Etiology unclear.   MRI  multifocal infarcts bilateral anterior and posterior calculation, including left MCA, left MCA/PCA, right MCA, bilateral cerebellum, with the largest at right MCA. 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  CTA head and neck - questionable right M2/M3 missing branches, no LVO.  2D Echo  EF 55-60%, unremarkable  LE venous Doppler no DVT  Recommend TEE and loop recorder to evaluate cardioembolic source. Re-schedule today  LDL 43  HgbA1c 5.9  Heparin subcutaneous for VTE prophylaxis  Diet NPO time specified   aspirin 81 mg daily prior to admission, now on aspirin 325 mg daily  Patient counseled to be compliant with his antithrombotic medications  Ongoing aggressive stroke risk factor management  Therapy recommendations:  SNF  Disposition:  Pending  UTI  One-time Spiking fever with tachycardia, now resolved  On bactrim   UA WBC TNTC  Urine culture E. Coli - CTX resistant  Blood culture NGTD  Diabetes  HgbA1c 5.9 goal < 7.0  controlled  CBG monitoring  SSI  Hypertension Home meds Lasix and lisinopril BP normotensive  Stable  Hyperlipidemia  Home meds:  Lipitor 20   LDL 43, goal < 70  Resumed Lipitor 20   Continue statin at discharge  Other Stroke Risk Factors  Advanced age  Obesity  CHF on lasix and lisinopril at home  Other Active Problems  Hypothyroidism with elevated TSH -on Synthroid   Bilateral LE wounds with dressing due to chronic venous stasis  Mild leukocytosis WBC 11.6  (afebrile)  Hospital day # 5   Marvel Plan, MD PhD Stroke Neurology 05/28/2017 7:34 AM     To contact Stroke Continuity provider, please refer to [email or web address]. After hours, contact General Neurology

## 2017-05-28 NOTE — Progress Notes (Signed)
Occupational Therapy Treatment Patient Details Name: Clayton Green. MRN: [id number] DOB: 17-Mar-1951 Today's Date: 05/28/2017    History of present illness This 66 y.o. male admitted with Lt sided weakness, and communication deficits.  MRI of head showed:  Nonhemorrhagic multifocal infarcts bilateral anterior and posterior calculation, including left MCA, left MCA/PCA, right MCA, bilateral cerebellum, with the largest at right MCA. 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 PMH - DM, HTN, neuropathy, severe arthritis, CKD, depression.   OT comments  Pt appears to be tolerating splint wear without evidence of pressure, increased pain, or increased edema.  He was a bit better able to follow commands for simple grooming today.  PROM of Lt UE WFL, but continued increase in spasticity.   Follow Up Recommendations  SNF    Equipment Recommendations  None recommended by OT    Recommendations for Other Services      Precautions / Restrictions Precautions Precautions: Fall Restrictions Weight Bearing Restrictions: No       Mobility Bed Mobility                  Transfers                      Balance                                           ADL either performed or assessed with clinical judgement   ADL Overall ADL's : Needs assistance/impaired     Grooming: Wash/dry face;Minimal assistance;Bed level Grooming Details (indicate cue type and reason): pt able to initiate activity to perform this date                                      Vision       Perception     Praxis      Cognition Arousal/Alertness: Awake/alert Behavior During Therapy: Flat affect Overall Cognitive Status: Impaired/Different from baseline Area of Impairment: Attention;Following commands;Problem solving                   Current Attention Level: Focused;Sustained   Following Commands: Follows one step  commands inconsistently;Follows one step commands with increased time     Problem Solving: Slow processing;Decreased initiation;Difficulty sequencing;Requires verbal cues;Requires tactile cues General Comments: Pt demonstrates significant deficits with initiation and with motor planning as well as communication deficits making it difficult to accurately assess cognition         Exercises Other Exercises Other Exercises: Splint not on UE.  Spoke with RN, who reports she removed it earlier.  She reports no evidence of redness or increased edema, however, pt indicated he was glad to have it removed.  She reports night nsg, reports pt does not like to wear splint.   Hand continues with flexor spasticity, but still able to achieve full PROM.   Other Exercises: PROM Lt hand, wrist, elbow and shoulder.  Pt denies pain.  Scap mobs performed and facilitation of scapula provided during shouler PROM    Shoulder Instructions       General Comments      Pertinent Vitals/ Pain       Pain Assessment: Faces Faces Pain Scale: No hurt  Home Living  Prior Functioning/Environment              Frequency  Min 2X/week        Progress Toward Goals  OT Goals(current goals can now be found in the care plan section)  Progress towards OT goals: Progressing toward goals     Plan Discharge plan remains appropriate    Co-evaluation                 AM-PAC PT "6 Clicks" Daily Activity     Outcome Measure   Help from another person eating meals?: Total Help from another person taking care of personal grooming?: A Lot Help from another person toileting, which includes using toliet, bedpan, or urinal?: Total Help from another person bathing (including washing, rinsing, drying)?: Total Help from another person to put on and taking off regular upper body clothing?: Total Help from another person to put on and taking off regular lower  body clothing?: Total 6 Click Score: 7    End of Session Equipment Utilized During Treatment: Oxygen  OT Visit Diagnosis: Hemiplegia and hemiparesis;Cognitive communication deficit (R41.841) Symptoms and signs involving cognitive functions: Cerebral infarction Hemiplegia - Right/Left: Left Hemiplegia - dominant/non-dominant: Non-Dominant Hemiplegia - caused by: Cerebral infarction   Activity Tolerance No increased pain   Patient Left in bed;with call bell/phone within reach;with bed alarm set   Nurse Communication Other (comment) (splint )        Time: 4098-1191 OT Time Calculation (min): 16 min  Charges: OT General Charges $OT Visit: 1 Visit OT Treatments $Neuromuscular Re-education: 8-22 mins  [employer], OTR/L [phone]    Jeani Hawking M 05/28/2017, 6:26 PM

## 2017-05-28 NOTE — Progress Notes (Signed)
Medicine attending: I examined this patient today together with resident physician Dr. Landis Martins and I concur with her evaluation and management plan which we discussed together. No acute change in status.  Afebrile on antibiotics for ESBL resistant E. coli UTI. Persistent dense left hemiplegia.  Dysarthria.  He follows all commands appropriately.  Although he repeats the word yes inappropriately sometimes, he does appear oriented x3 and answers all questions and follows all commands appropriately.  I personally think he is competent to make a decision on whether or not to undergo procedures.  We will try to contact family. There are notes in the chart from emergency department visit after a fall last month and in his primary care doctor's office 1 week prior to admission and he does not appear to be incapacitated either physically or mentally.  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 Impression: 1.  Acute multi-infarct stroke with dominant lesion affecting the right MCA with resultant left hemiplegia, facial droop, and dysarthria.  Presumed embolic.  Currently on aspirin alone. For transesophageal echocardiogram today 2.  Resistant E. coli UTI stable on treatment 3.  Disposition: Not a candidate for inpatient rehab.  When stroke evaluation complete, anticipate discharge to skilled nursing facility.  Will need good follow-up and initiation of anticoagulation in 2-3 weeks if in fact we feel this is an embolic stroke.  I think the risk of further physical handicaps from another stroke outweigh the risks of anticoagulation in somebody who may be more prone to having a fall.

## 2017-05-29 DIAGNOSIS — I639 Cerebral infarction, unspecified: Secondary | ICD-10-CM

## 2017-05-29 LAB — BASIC METABOLIC PANEL
ANION GAP: 9 (ref 5–15)
BUN: 12 mg/dL (ref 6–20)
CHLORIDE: 105 mmol/L (ref 101–111)
CO2: 23 mmol/L (ref 22–32)
Calcium: 8.2 mg/dL — ABNORMAL LOW (ref 8.9–10.3)
Creatinine, Ser: 0.9 mg/dL (ref 0.61–1.24)
GFR calc non Af Amer: >60 mL/min (ref >60)
Glucose, Bld: 96 mg/dL (ref 65–99)
POTASSIUM: 4.5 mmol/L (ref 3.5–5.1)
SODIUM: 137 mmol/L (ref 135–145)

## 2017-05-29 LAB — CBC
HEMATOCRIT: 36.4 % — AB (ref 39.0–52.0)
HEMOGLOBIN: 11.5 g/dL — AB (ref 13.0–17.0)
MCH: 29.7 pg (ref 26.0–34.0)
MCHC: 31.6 g/dL (ref 30.0–36.0)
MCV: 94.1 fL (ref 78.0–100.0)
Platelets: 185 10*3/uL (ref 150–400)
RBC: 3.87 MIL/uL — AB (ref 4.22–5.81)
RDW: 18.3 % — ABNORMAL HIGH (ref 11.5–15.5)
WBC: 11.3 10*3/uL — ABNORMAL HIGH (ref 4.0–10.5)

## 2017-05-29 LAB — TROPONIN I: TROPONIN I: 0.06 ng/mL — AB (ref <0.03)

## 2017-05-29 LAB — GLUCOSE, CAPILLARY
GLUCOSE-CAPILLARY: 105 mg/dL — AB (ref 65–99)
GLUCOSE-CAPILLARY: 140 mg/dL — AB (ref 65–99)
GLUCOSE-CAPILLARY: 125 mg/dL — AB (ref 65–99)
Glucose-Capillary: 100 mg/dL — ABNORMAL HIGH (ref 65–99)
Glucose-Capillary: 152 mg/dL — ABNORMAL HIGH (ref 65–99)

## 2017-05-29 MED ORDER — DICLOFENAC SODIUM 1 % TD GEL: 2.0000 g | Freq: Four times a day (QID) | TRANSDERMAL | Status: DC | PRN

## 2017-05-29 MED ORDER — POLYETHYLENE GLYCOL 3350 17 G PO PACK
17.0000 g | PACK | Freq: Two times a day (BID) | ORAL | Status: DC
  Administered 2017-05-29 – 2017-05-31 (×5): 17 g via ORAL
  Filled 2017-05-29 (×6): qty 1

## 2017-05-29 MED ORDER — SENNOSIDES-DOCUSATE SODIUM 8.6-50 MG PO TABS
1.0000 | ORAL_TABLET | Freq: Two times a day (BID) | ORAL | Status: DC | PRN
  Administered 2017-05-30: 1 via ORAL
  Filled 2017-05-29: qty 1

## 2017-05-29 MED ORDER — BISACODYL 5 MG PO TBEC
5.0000 mg | DELAYED_RELEASE_TABLET | Freq: Every day | ORAL | Status: DC | PRN
  Administered 2017-05-29: 5 mg via ORAL
  Filled 2017-05-29: qty 1

## 2017-05-29 NOTE — Progress Notes (Signed)
Patient complaining of 8/10 sharp chest pain. EKG obtained and in chart. IMTS notified.

## 2017-05-29 NOTE — Progress Notes (Signed)
Patient c/o chest pain. Patient unable to rate pain on a scale, but continually asked if he was "Gonna die?" O2 saturation 96% on 2L. HR 110. Obtained EKG, paged teaching team. Spoke to Dr. Frances Furbish.

## 2017-05-29 NOTE — Plan of Care (Signed)
Problem: Nutrition: Goal: Risk of aspiration will decrease Outcome: Progressing Patient tolerating pudding-thick liquids

## 2017-05-29 NOTE — Progress Notes (Signed)
STROKE TEAM PROGRESS NOTE   SUBJECTIVE (INTERVAL HISTORY) No family at bedside. Pt slightly drowsy this morning, but RN said he did not get good sleep last night. However, easily open eyes and following commands, answering questions. Neuro stable without significant change. TEE not done and felt not a candidate. Agree with outpt cardiac event monitoring.    OBJECTIVE Temp:  [97.7 F (36.5 C)-100 F (37.8 C)] 98.2 F (36.8 C) (10/13 1127) Pulse Rate:  [81-108] 108 (10/13 1127) Cardiac Rhythm: Normal sinus rhythm;Sinus tachycardia (10/13 1200) Resp:  [11-30] 25 (10/13 1127) BP: (115-138)/(67-81) 123/68 (10/13 1127) SpO2:  [92 %-100 %] 99 % (10/13 1127)   Recent Labs Lab 05/28/17 1608 05/28/17 1937 05/28/17 2334 05/29/17 0330 05/29/17 0747  GLUCAP 119* 140* 150* 100* 105*    Recent Labs Lab 05/25/17 0222 05/26/17 0449 05/27/17 0450 05/27/17 0850 05/28/17 0453 05/29/17 0410  NA 141 142 140 141 139 137  K 3.7 4.1 4.1 4.2 4.4 4.5  CL 108 111 110 110 106 105  CO2 GLUCOSE 141* 124* 115* 110* 111* 96  BUN 29* 21* CREATININE 1.17 0.96 0.90 0.86 0.89 0.90  CALCIUM 8.3* 8.1* 8.2* 8.5* 8.7* 8.2*  MG 1.8  --   --   --   --   --     Recent Labs Lab 05/23/17 0756 05/24/17 0351  AST 39 33  ALT 19 15*  ALKPHOS 214* 170*  BILITOT 1.0 1.2  PROT 7.3 6.0*  ALBUMIN 3.3* 2.6*    Recent Labs Lab 05/23/17 0756  05/25/17 0222 05/26/17 0449 05/27/17 0450 05/28/17 0453 05/29/17 0410  WBC 11.7*  < > 8.5 7.7 8.9 11.6* 11.3*  NEUTROABS 9.1*  --   --   --   --   --   --   HGB 12.4*  < > 10.2* 10.1* 10.7* 11.7* 11.5*  HCT 39.4  < > 33.4* 33.2* 34.9* 38.1* 36.4*  MCV 93.8  < > 94.6 95.4 95.4 94.5 94.1  PLT 159  < > 156 162 185 208 185  < > = values in this interval not displayed.  Recent Labs Lab 05/23/17 0756 05/23/17 1410 05/23/17 2136 05/24/17 0738  TROPONINI 0.21* 0.20* 0.25* 0.27*    Recent Labs  05/27/17 0850  LABPROT 13.4   INR 1.02   No results for input(s): COLORURINE, LABSPEC, PHURINE, GLUCOSEU, HGBUR, BILIRUBINUR, KETONESUR, PROTEINUR, UROBILINOGEN, NITRITE, LEUKOCYTESUR in the last 72 hours.  Invalid input(s): APPERANCEUR     Component Value Date/Time   CHOL 110 05/24/2017 0351   TRIG 92 05/24/2017 0351   HDL 49 05/24/2017 0351   CHOLHDL 2.2 05/24/2017 0351   VLDL 18 05/24/2017 0351   LDLCALC 43 05/24/2017 0351   Lab Results  Component Value Date   HGBA1C 5.9 (H) 05/24/2017   No results found for: LABOPIA, COCAINSCRNUR, LABBENZ, AMPHETMU, THCU, LABBARB  No results for input(s): ETH in the last 168 hours.  I have personally reviewed the radiological images below and agree with the radiology interpretations.  CTA Head and neck and CTP W Or Wo Contrast 05/23/2017 IMPRESSION: Missing M2 to M3 branch on the right without evidence of a retrievable proximal thrombus. This corresponds to the region of infarction demonstrated and noncontrast CT and for fusion. No upstream vascular lesion seen to predispose to stroke. Minimal atherosclerotic changes without stenosis or irregularity. Perfusion imaging shows complete infarction of 18 cc in the insular and posterior frontal region. Surrounding  region of diminished for fusion is calculated at 66 cc, but this is probably exaggerated based on inclusion of some left ACA territory brain, which I think is probably normal based on the vascular imaging.   Mr Brain Wo Contrast 05/23/2017 IMPRESSION: 1. Multifocal nonhemorrhagic infarcts involving anterior and posterior circulation bilaterally. This suggests a central embolic source. 2. The largest area of infarction involves the right MCA territory, specifically the right frontal operculum and insular cortex. 3. Confluent area of acute nonhemorrhagic infarct involving the posterior left parietal lobe and occipital lobe. 4. Additional punctate foci of acute infarction throughout the posterior circulation bilaterally. 5.  Remote encephalomalacia involving the posterior left temporal and occipital lobe.   Ct Head Code Stroke Wo Contrast 05/23/2017 IMPRESSION: 1. Acute/subacute infarction in the right middle cerebral artery territory with mild swelling but no hemorrhage. I would estimate that this is greater than 8 hours old given these findings. Region of infarction measures approximately 6 cm. 2. ASPECTS is 6   2D Echocardiogram   - Technically difficult; definity used; normal LV systolic function; mild diastolic dysfunction; calcified aortic valve with mild AS (mean gradient 11 mmHg); mild LAE; trace TR with moderate elevation of pulmonary pressure.  LE venous doppler 05/24/2017 Technically challenging due to pain in patient's extremities related to chronic wounds No DVT, positive for superficial thrombus in R greater saphenous vein  CXR 05/28/17  Mild bibasilar subsegmental atelectasis or inflammation, with minimal right pleural effusion.   PHYSICAL EXAM  Temp:  [97.7 F (36.5 C)-100 F (37.8 C)] 98.2 F (36.8 C) (10/13 1127) Pulse Rate:  [81-108] 108 (10/13 1127) Resp:  [11-30] 25 (10/13 1127) BP: (115-138)/(67-81) 123/68 (10/13 1127) SpO2:  [92 %-100 %] 99 % (10/13 1127)  General - Obese, in no apparent distress.  Mental Status -  Oriented to person, place, month and year Language exam showed partial expressive aphasia, able to answer orientation questions appropriately, follow simple commands, anomia but able to speak phrases, and repeat short sentences, severe perseveration. Moderate dysarthria. Left neglect.  Cranial Nerves II - XII - II - not blinking to visual threat on the left III, IV, VI - PERRL, right gaze preference but he crosses midline to command. V - Facial sensation intact bilaterally. VII - left facial droop. VIII - Hearing grossly intact. X - Palate elevates symmetrically, moderate dysarthria. XII - Tongue protrusion midline.  Motor Strength - 4/5 strength on R sided  extremities, 0/5 left extremities although has reflexive withdrawal in LLE.   Tone - Left extremities upper and bilateral lower with significantly increased resting tone (? baseline wheelchair and incontinent)  Reflexes - Positive babinksi on LLE  Sensation, coordination not cooperative and gait not tested.    ASSESSMENT/PLAN Mr. Angie Hogg. is a 66 y.o. male with history of DM, HTN, HLD, hypothyroidism, bilateral lower extremity venous stasis, CHF admitted for slurry speech, left-sided weakness and urinary incontinence. Patient has recent fall with scalp hematoma.     Stroke:  Multifocal infarcts bilateral anterior and posterior circulation, consistent with cardioembolic infarct. Etiology unclear.   MRI  multifocal infarcts bilateral anterior and posterior calculation, including left MCA, left MCA/PCA, right MCA, bilateral cerebellum, with the largest at right MCA. 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  CTA head and neck - questionable right M2/M3 missing branches, no LVO.  2D Echo  EF 55-60%, unremarkable  LE venous Doppler no DVT  Recommend 30 day cardiac event monitoring as outpt to rule out afib  LDL  43  HgbA1c 5.9  Heparin subcutaneous for VTE prophylaxis  DIET - DYS 1 Room service appropriate? Yes; Fluid consistency: Pudding Thick   aspirin 81 mg daily prior to admission, now on aspirin 325 mg daily  Patient counseled to be compliant with his antithrombotic medications  Ongoing aggressive stroke risk factor management  Therapy recommendations:  SNF  Disposition:  Pending  UTI  One-time Spiking fever with tachycardia, now resolved  On bactrim   UA WBC TNTC  Urine culture E. Coli - CTX resistant  Blood culture negative  Diabetes  HgbA1c 5.9 goal < 7.0  controlled  CBG monitoring  SSI  Hypertension Home meds Lasix and lisinopril BP normotensive  Stable  Hyperlipidemia  Home meds:  Lipitor 20   LDL 43, goal  < 70  Resumed Lipitor 20   Continue statin at discharge  Other Stroke Risk Factors  Advanced age  Obesity  CHF on lasix and lisinopril at home  Other Active Problems  Hypothyroidism with elevated TSH -on Synthroid   Bilateral LE wounds with dressing due to chronic venous stasis  Mild leukocytosis WBC 11.6-> 11.3 (afebrile)  Hospital day # 6  Neurology will sign off. Please call with questions. Pt will follow up with Darrol Angel, NP, at [facility] in about 6 weeks. Thanks for the consult.   Marvel Plan, MD PhD Stroke Neurology 05/29/2017 3:16 PM     To contact Stroke Continuity provider, please refer to [email or web address]. After hours, contact General Neurology

## 2017-05-29 NOTE — Progress Notes (Signed)
Subjective:  Patient had complaints of chest pain overnight and evaluated by night team - by time of evaluation chest pain had resolved. EKG was largely unchanged from previous with some PACs. This AM patient denies further chest pain, denies shortness of breath, N/V, abd pain.   Objective:  Vital signs in last 24 hours: Vitals:   05/28/17 1938 05/28/17 2300 05/29/17 0300 05/29/17 0747  BP: 134/77 115/67 138/71 125/81  Pulse: 96 86 92 95  Resp: (!) 27 11 (!) 30 (!) 27  Temp: 98.7 F (37.1 C) 98.7 F (37.1 C) 98.8 F (37.1 C) 97.7 F (36.5 C)  TempSrc: Oral Oral Oral Oral  SpO2: 99% 92% 98% 100%  Weight:      Height:       General: Sitting up in bed , NAD HEENT: [place], left infraorbital bruising, midline forehead abrasion, able to track today Cardiac: RRR, No R/M/G appreciated Pulm: normal effort, CTAB  Abd: soft, non tender, non distended, BS normal Ext: extremities with chronic venous stasis changes; RLE with healing wounds without drainage - mild TTP and warmth however improved since admission; LLE with well healing wounds with granulation tissue w/o drainage - some inc warmth and TTP but improved since admission   Neuro: alert and oriented X3, right sided gaze preference leftward saccades, left sided facial droop, dysarthric speech, strength 4/5 on right upper and 3/5 right lower extremity, left upper and lower extremity 0/5, lower extremities remain contracted and rigid, upward Babinski on left   Assessment/Plan:  Principal Problem:   Acute ischemic stroke (HCC) Active Problems:   Right sided cerebral hemisphere cerebrovascular accident (CVA) (HCC)   AKI (acute kidney injury) (HCC)   Acute cystitis with hematuria   Controlled type 2 diabetes mellitus with hyperglycemia, without long-term current use of insulin (HCC)   Cerebrovascular accident (CVA) due to embolism of right middle cerebral artery (HCC)   Pressure injury of skin   E. coli UTI (urinary tract  infection)  Right, emoblic MCA CVA Multiple small embolic CVAs Patient presumed cardioembolic etiology of his strokes. Patient with improving dysphagia and aphasia. He has continued LUE spastic paralysis and RLE spasticity with weakness but has been able to intermittently move his toes. Echo shows normal EF, and G1DD; he has a calcified MV annulus. TEE has not been able to be completed due to no POA and patient unable to give consent. Recommendation is instead for a 30 day event monitor. PT/OT recommend SNF placement. -Neurology following appreciate recs -Telemetry -ASA 325 mg daily, Lipitor 20 mg  -PT/OT, Speech therapy, Nutritional management following; appreciate recs -Dysphagia 1 diet  Leukocytosis Patient with stable, mild leukocytosis today. Blood cultures with NGTD x 5 days - final. He has had low-grade temps throughout admission but no true temp since day of admission. CXR yesterday with still some basilar atelectasis but no true infiltrates or consolidation that would be concerning for pneumonia. He has been on appropriate antibiotics for his UTI x5 days. -CBC in AM   Chest pain Patient with one episode of chest pain last night that resolved and did not recur. EKG overnight with PACs otherwise no ischemic changes. -will continue monitoring  UTI Urine culture + for ESBL resistant E. Coli. Received 1 day of ceftriaxone, 3 days of meropenem, and is now on Bactrim. Total days of abx = 6, Total days of abx with appropriate coverage = 5 - Continue Bactrim 20 mL solution Q12  Type 2 DM A1C 5.9, well controlled. Complicated by neuropathy.  CBG WNLs. -On metformin 500 mg BID at home -Insulin SS TID with meals   HTN, HLD BP stable -Holding HTN Lisinopril 20 mg -atorva  daily   Hypothryoidsim Post surgical hypothyroidism. TSH elevated at 7.525.  -Synthroid 176 mcg QD crushed with pudding   Chronic venous insufficiency Patient with +1 lower extremity edema and bilateral venous  ulcerations. 2 venous ulcerations on the LLE, small open area on the dorsal right foot. Both ulcerations on the LLE are clean, with good granulation tissue; mild warmth surrounding however improved from admission.  -Wound care recommendations relayed to nursing; will re-consult if needed   F: 100 cc/hr NS E: Repletion as needed N: Dys 1 PuddingThick  DVT prophylaxis: SQ heparin   Dispo: Anticipated discharge in approximately 3-4 days.   Nyra Market, MD 05/29/2017, 10:46 AM

## 2017-05-29 NOTE — Progress Notes (Signed)
  Date: 05/29/2017  Patient name: Clayton Green.  Medical record number: [id number]  Date of birth: 1951/05/18   I have seen and evaluated this patient and I have discussed the plan of care with the house staff. Please see their note for complete details. I concur with their findings with the following additions/corrections:   66 year old male here for multifocal stroke and ESBL UTI, now stable for discharge to rehabilitation. Will need ongoing cardiac event monitor as his stroke is highly concerning for cardioembolic event. Unable to receive TEE given inability to consent  Anne Shutter, MD 05/29/2017, 7:08 PM

## 2017-05-29 NOTE — Progress Notes (Signed)
Paged that patient was experiencing chest pain. Went to room to evaluate. EKG review illustrating no ST elevation, T wave inversion, or new LBBB. Discussing the quality of the chest pain with the patient he states that it has improved, but is sharp in nature. It seems to be worse with deep inspiration. He has never had a MI although he has multiple risk factors including HTN, HLD, and DM.   He was hemodynamically stable when we evaluated him. There was no tenderness to palpation of the chest wall. I do no think his chest pain is of cardiac etiology, therefore, we will not obtain a troponin. The most likely etiology is either MSK or GERD. We will try Voltaren gel.

## 2017-05-29 NOTE — Progress Notes (Signed)
CRITICAL VALUE ALERT  Critical Value:  Troponin 0.06  Date & Time Notied:  2222   10/13  Provider Notified: Earlie Counts, MD  Orders Received/Actions taken: Monitor

## 2017-05-29 NOTE — Plan of Care (Signed)
Problem: Education: Goal: Knowledge of secondary prevention will improve Outcome: Not Progressing No evidence of learning

## 2017-05-30 LAB — CBC
HCT: 36.3 % — ABNORMAL LOW (ref 39.0–52.0)
HEMOGLOBIN: 11.3 g/dL — AB (ref 13.0–17.0)
MCH: 29.4 pg (ref 26.0–34.0)
MCHC: 31.1 g/dL (ref 30.0–36.0)
MCV: 94.3 fL (ref 78.0–100.0)
Platelets: 184 10*3/uL (ref 150–400)
RBC: 3.85 MIL/uL — AB (ref 4.22–5.81)
RDW: 18.3 % — ABNORMAL HIGH (ref 11.5–15.5)
WBC: 10.2 10*3/uL (ref 4.0–10.5)

## 2017-05-30 LAB — BASIC METABOLIC PANEL
Anion gap: 8 (ref 5–15)
BUN: 14 mg/dL (ref 6–20)
CHLORIDE: 105 mmol/L (ref 101–111)
CO2: 22 mmol/L (ref 22–32)
CREATININE: 0.99 mg/dL (ref 0.61–1.24)
Calcium: 8.2 mg/dL — ABNORMAL LOW (ref 8.9–10.3)
GFR calc Af Amer: >60 mL/min (ref >60)
GFR calc non Af Amer: >60 mL/min (ref >60)
GLUCOSE: 109 mg/dL — AB (ref 65–99)
POTASSIUM: 3.9 mmol/L (ref 3.5–5.1)
Sodium: 135 mmol/L (ref 135–145)

## 2017-05-30 LAB — GLUCOSE, CAPILLARY
GLUCOSE-CAPILLARY: 94 mg/dL (ref 65–99)
GLUCOSE-CAPILLARY: 98 mg/dL (ref 65–99)
GLUCOSE-CAPILLARY: 109 mg/dL — AB (ref 65–99)
Glucose-Capillary: 114 mg/dL — ABNORMAL HIGH (ref 65–99)
Glucose-Capillary: 94 mg/dL (ref 65–99)
Glucose-Capillary: 123 mg/dL — ABNORMAL HIGH (ref 65–99)

## 2017-05-30 LAB — TROPONIN I
Troponin I: 0.06 ng/mL (ref <0.03)
Troponin I: 0.05 ng/mL (ref <0.03)

## 2017-05-30 MED ORDER — INSULIN ASPART 100 UNIT/ML ~~LOC~~ SOLN
0.0000 [IU] | Freq: Three times a day (TID) | SUBCUTANEOUS | Status: DC
Start: 2017-05-30 — End: 2017-06-01

## 2017-05-30 MED ORDER — BISACODYL 5 MG PO TBEC
5.0000 mg | DELAYED_RELEASE_TABLET | Freq: Every day | ORAL | Status: DC
  Administered 2017-05-31 – 2017-06-01 (×2): 5 mg via ORAL
  Filled 2017-05-30 (×2): qty 1

## 2017-05-30 NOTE — Progress Notes (Signed)
  Date: 05/30/2017  Patient name: Clayton Green.  Medical record number: [id number]  Date of birth: 26-Aug-1950   I have seen and evaluated this patient and I have discussed the plan of care with the house staff. Please see their note for complete details. I concur with their findings with the following additions/corrections:   66 year old male admitted for multifocal stroke. He continues to have significant weakness and spastic plegia of multiple extremities. He is awaiting placement at a rehabilitation facility to continue his recovery.  Unfortunately, last night he spiked a fever to 101.3. The night team was not notified of this. At the time, he had tachypnea and tachycardia, which have since resolved. He appears to be back to his baseline. He denies any localizing symptoms today. His exam appears stable from prior with some coarse breath sounds but no signs of consolidation. He is satting well on room air and in no respiratory distress today. At the time of our exam, he appeared to be straining to have a bowel movement or urinate, but denied any dysuria or difficulty with vomiting or diarrhea. He was not pleased with our presence and asked [place] to leave him alone. We will watch for further fevers and workup if they recur.  Anne Shutter, MD 05/30/2017, 1:59 PM

## 2017-05-30 NOTE — Progress Notes (Signed)
Subjective:  Patient with complaints of chest pain last night for which I evaluated him; at the time he endorsed diffuse, sharp chest pain without shortness of breath, nausea, vomiting, headache; he did not appear tender to palpation of his chest. EKG at the time was largely unchanged and troponins were trended overnight and flat at 0.06. Telemetry at time of chest pain showed only some PVCs. Patient also spiked a fever to 101.59F overnight.  This morning, patient required repeated encouragement to interact with team; he denied further chest pain though did endorse shortness of breath (he was not on oxygen and saturating at 95%). He did not have an appetite this morning and declined breakfast.  Objective:  Vital signs in last 24 hours: Vitals:   05/30/17 0000 05/30/17 0156 05/30/17 0411 05/30/17 0730  BP: 131/77  120/60 97/61  Pulse: 96  90 (!) 54  Resp: (!) 28   18  Temp:  98.6 F (37 C) 98.8 F (37.1 C)   TempSrc:  Oral Oral   SpO2: 95%   93%  Weight:      Height:       General: Lying in bed, NAD HEENT: [place], left infraorbital bruising, midline forehead abrasion Cardiac: RRR, No R/M/G appreciated Pulm: normal effort, CTAB on lateral lung fields Abd: soft, non tender, non distended, BS decreased Ext: extremities with chronic venous stasis changes; RLE with healing wounds without drainage - mild TTP and warmth however improved since admission; LLE wounds wrapped  Neuro: alert; patient declined full neurological testing this morning; he still has a left facial droop.  Assessment/Plan:  Principal Problem:   Acute ischemic stroke [facility]) Active Problems:   Right sided cerebral hemisphere cerebrovascular accident (CVA) (HCC)   AKI (acute kidney injury) (HCC)   Acute cystitis with hematuria   Controlled type 2 diabetes mellitus with hyperglycemia, without long-term current use of insulin (HCC)   Cerebrovascular accident (CVA) due to embolism of right middle cerebral artery (HCC)  Pressure injury of skin   E. coli UTI (urinary tract infection)  Acute Right, emoblic MCA CVA Multiple small embolic CVAs Patient presumed cardioembolic etiology of his strokes. Patient with improving dysphagia and aphasia. He has continued LUE spastic paralysis and RLE spasticity with weakness but has been able to intermittently move his toes. Echo shows normal EF, and G1DD; he has a calcified MV annulus. TEE has not been able to be completed due to no POA and patient unable to give consent. Recommendation is instead for a 30 day event monitor. PT/OT recommend SNF placement. -Neurology signed off; appreciate their recommendations -Telemetry -ASA 325 mg daily, Lipitor 20 mg  -PT/OT, Speech therapy, Nutritional management following; appreciate recs -Dysphagia 1 diet  Leukocytosis Fever Leukocytosis resolved this AM, however patient spiked a fever overnight to 101.59F. Patient evaluated last night and this morning with no infectious symptoms or signs and stable LE wounds. There is concern for further resistant UTI that is not controlled with Bactrim.  -repeat BCx  -CBC in AM   UTI Urine culture + for ESBL resistant E. Coli. Received 1 day of ceftriaxone, 3 days of meropenem, and is now on Bactrim. Total days of abx = 7, Total days of abx with appropriate coverage = 6 - Continue Bactrim 20 mL solution Q12  Chest pain Patient with two nights of chest pain; first episode resolved prior to evaluation, second episode was witnessed last night by me - he endorsed diffuse sharp chest pain. His EKG have remained largely unchanged; trops  were trended last night and were flat at 0.06. He has been receiving sq lovenox for VTE prophylaxis so risk of that is lower. He had no chest pain this morning.  -will continue monitoring on tele -f/u Bmet and Mg in AM  Type 2 DM A1C 5.9, well controlled. Complicated by neuropathy. CBG WNLs. -On metformin 500 mg BID at home -Insulin SS TID with meals   HTN,  HLD BP stable -Holding HTN Lisinopril 20 mg -atorva  daily   Hypothryoidsim Post surgical hypothyroidism. TSH elevated at 7.525.  -Synthroid 176 mcg QD crushed with pudding   Chronic venous insufficiency Patient with +1 lower extremity edema and bilateral venous ulcerations. 2 venous ulcerations on the LLE, small open area on the dorsal right foot. Both ulcerations on the LLE are clean, with good granulation tissue; mild warmth surrounding however improved from admission.  -Wound care recommendations relayed to nursing; will re-consult if needed   F: 50 cc/hr NS E: Repletion as needed N: Dys 1 PuddingThick  DVT prophylaxis: lovenox   Dispo: Anticipated discharge in approximately 3-4 days.   Nyra Market, MD 05/30/2017, 12:16 PM

## 2017-05-31 ENCOUNTER — Other Ambulatory Visit: Payer: Self-pay | Admitting: Cardiology

## 2017-05-31 DIAGNOSIS — D72829 Elevated white blood cell count, unspecified: Secondary | ICD-10-CM

## 2017-05-31 DIAGNOSIS — I639 Cerebral infarction, unspecified: Secondary | ICD-10-CM

## 2017-05-31 DIAGNOSIS — Z9189 Other specified personal risk factors, not elsewhere classified: Secondary | ICD-10-CM

## 2017-05-31 LAB — GLUCOSE, CAPILLARY
GLUCOSE-CAPILLARY: 100 mg/dL — AB (ref 65–99)
GLUCOSE-CAPILLARY: 91 mg/dL (ref 65–99)
GLUCOSE-CAPILLARY: 107 mg/dL — AB (ref 65–99)
GLUCOSE-CAPILLARY: 100 mg/dL — AB (ref 65–99)
Glucose-Capillary: 112 mg/dL — ABNORMAL HIGH (ref 65–99)
Glucose-Capillary: 90 mg/dL (ref 65–99)
Glucose-Capillary: 101 mg/dL — ABNORMAL HIGH (ref 65–99)

## 2017-05-31 LAB — CBC
HCT: 38.1 % — ABNORMAL LOW (ref 39.0–52.0)
Hemoglobin: 12 g/dL — ABNORMAL LOW (ref 13.0–17.0)
MCH: 29.6 pg (ref 26.0–34.0)
MCHC: 31.5 g/dL (ref 30.0–36.0)
MCV: 94.1 fL (ref 78.0–100.0)
PLATELETS: 144 10*3/uL — AB (ref 150–400)
RBC: 4.05 MIL/uL — AB (ref 4.22–5.81)
RDW: 18.4 % — ABNORMAL HIGH (ref 11.5–15.5)
WBC: 10.6 10*3/uL — AB (ref 4.0–10.5)

## 2017-05-31 LAB — MAGNESIUM: MAGNESIUM: 1.7 mg/dL (ref 1.7–2.4)

## 2017-05-31 LAB — BASIC METABOLIC PANEL
Anion gap: 9 (ref 5–15)
BUN: 14 mg/dL (ref 6–20)
CALCIUM: 8.3 mg/dL — AB (ref 8.9–10.3)
CHLORIDE: 104 mmol/L (ref 101–111)
CO2: 21 mmol/L — ABNORMAL LOW (ref 22–32)
CREATININE: 1.01 mg/dL (ref 0.61–1.24)
GFR calc non Af Amer: >60 mL/min (ref >60)
Glucose, Bld: 95 mg/dL (ref 65–99)
Potassium: 4.4 mmol/L (ref 3.5–5.1)
SODIUM: 134 mmol/L — AB (ref 135–145)

## 2017-05-31 NOTE — Progress Notes (Signed)
Occupational Therapy Treatment Patient Details Name: Clayton Green. MRN: [id number] DOB: May 10, 1951 Today's Date: 05/31/2017    History of present illness This 66 y.o. male admitted with Lt sided weakness, and communication deficits.  MRI of head showed:  Nonhemorrhagic multifocal infarcts bilateral anterior and posterior calculation, including left MCA, left MCA/PCA, right MCA, bilateral cerebellum, with the largest at right MCA. 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 PMH - DM, HTN, neuropathy, severe arthritis, CKD, depression.   OT comments  Pt appears to be tolerating splint wear.  He continues with mod spasticity Lt UE.  He indicates pain Lt shoulder with ROM, but this improves with scapular mobilization.   Attempted to work on tone inhibition with him today, but he became visibly upset and asks for everyone to leave him alone.  When asks, he does state he is sad and "very depressed".  I did not push him today, as this is the first time, he has made such requests.   We have not yet been able to progress him out of bed due to severity of extensor spasticity when he is moved to EOB sitting  - there is significant concern that he will slide out of recliner.   Will continue attempts to reduce spasticity and increase his ability to assist with mobility and activity.  May benefit from chair position in the bed, however, he will not allow me to do that today.   He also consistently endorses "pain when I pee".  Will continue to follow   Follow Up Recommendations  SNF    Equipment Recommendations  None recommended by OT    Recommendations for Other Services      Precautions / Restrictions Precautions Precautions: Fall       Mobility Bed Mobility               General bed mobility comments: Pt resisted attempts today - becomse visibly upset with attempts to move him stating "whey don't people leave me alone.  don't bother me!"     Transfers                  General transfer comment: unable to attempt due to pt resistance     Balance                                           ADL either performed or assessed with clinical judgement   ADL                                               Vision   Additional Comments: Pt spontaneously looked to Lt to locate OT upon entrance    Perception     Praxis      Cognition Arousal/Alertness: Awake/alert;Lethargic Behavior During Therapy: Flat affect Overall Cognitive Status: Impaired/Different from baseline Area of Impairment: Attention;Following commands;Memory;Safety/judgement;Awareness;Problem solving                   Current Attention Level: Focused;Sustained Memory: Decreased short-term memory;Decreased recall of precautions Following Commands: Follows one step commands consistently;Follows one step commands with increased time Safety/Judgement: Decreased awareness of deficits;Decreased awareness of safety   Problem Solving: Slow processing;Decreased initiation;Difficulty sequencing;Requires verbal cues;Requires tactile cues General Comments: Pt  spontaneously looked to me on the left today.  He answers questions more quickly today with less perseveration         Exercises Other Exercises Other Exercises: Per RN, splint fitting without issues.  PROM performed Lt UE.  Pt initially states Lt shoulder hurts with PROM ~60* flex.  Scapular mobilization and mild distraction of humerus performed with pt indicating less pain.  Tolerates shoulder flexion to ~110*, abduction ~90*.   All other joints with PROM WFL, but mod increased flexor spasticity and UE with extensor spasticity    Shoulder Instructions       General Comments Attempted to work on inhibitory techniques to reduce pain and spasticity, however, pt became visibly upset when attempted to move him.  He asks repeatedly to be left alone.  When asks, he states he is sad and  "very depressed".  He also voices pain with peeing - RN informed     Pertinent Vitals/ Pain       Pain Assessment: Faces Faces Pain Scale: Hurts little more Pain Location: Lt shoulder with ROM;  c/o pain with urination  Pain Descriptors / Indicators: Grimacing;Guarding;Moaning Pain Intervention(s): Monitored during session;Repositioned;Limited activity within patient's tolerance  Home Living                                          Prior Functioning/Environment              Frequency  Min 2X/week        Progress Toward Goals  OT Goals(current goals can now be found in the care plan section)  Progress towards OT goals: Not progressing toward goals - comment     Plan Discharge plan remains appropriate    Co-evaluation                 AM-PAC PT "6 Clicks" Daily Activity     Outcome Measure   Help from another person eating meals?: Total Help from another person taking care of personal grooming?: Total Help from another person toileting, which includes using toliet, bedpan, or urinal?: Total Help from another person bathing (including washing, rinsing, drying)?: Total Help from another person to put on and taking off regular upper body clothing?: Total Help from another person to put on and taking off regular lower body clothing?: Total 6 Click Score: 6    End of Session Equipment Utilized During Treatment: Oxygen  OT Visit Diagnosis: Hemiplegia and hemiparesis;Cognitive communication deficit (R41.841) Symptoms and signs involving cognitive functions: Cerebral infarction Hemiplegia - Right/Left: Left Hemiplegia - dominant/non-dominant: Non-Dominant Hemiplegia - caused by: Cerebral infarction   Activity Tolerance Patient limited by pain;Other (comment) (Pt request to leave him alone today )   Patient Left in bed;with call bell/phone within reach   Nurse Communication Mobility status        Time: 1610-9604 OT Time Calculation (min):  14 min  Charges: OT General Charges $OT Visit: 1 Visit OT Treatments $Neuromuscular Re-education: 8-22 mins  [employer], OTR/L [phone]    Jeani Hawking M 05/31/2017, 1:03 PM

## 2017-05-31 NOTE — Plan of Care (Signed)
Problem: Nutrition: Goal: Risk of aspiration will decrease Outcome: Progressing Discussed plan of care for the evening and slowing down when drinking with no evidence of learning at this time.

## 2017-05-31 NOTE — Progress Notes (Signed)
  Speech Language Pathology Treatment: Dysphagia  Patient Details Name: Clayton Green. MRN: [id number] DOB: 12-Apr-1951 Today's Date: 05/31/2017 Time: 4098-1191 SLP Time Calculation (min) (ACUTE ONLY): 9 min  Assessment / Plan / Recommendation Clinical Impression  Pt seems a little drowsier this morning, requiring Mod cues for eye opening and sustained attention. He consumed limited spoonfuls of honey thick liquids with only one delayed throat clear noted. His vocal quality remained clear. After only a few trials pt started saying "no" repeatedly and asked to be left alone despite SLP encouragement and education about rationale for advanced PO trials. He did say that he did not sleep much last night. I am hopeful for repeating MBS soon to assess for potential improvement as it has been a week since his initial study was done; however, he needs to take more PO trials for it to be an effective study. Will f/u for readiness.   HPI HPI: 66 year old man with a past medical history of diabetes hypertension hyperlipidemia depression hypothyroidism venous stasis ulcers and chronic diastolic heart failure brought in by [facility] for evaluation of left-sided weakness. CCT subacute/acute infarction right MCA territory affecting insula and frontoparietal region.  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        SLP Plan  Continue with current plan of care       Recommendations  Diet recommendations: Dysphagia 1 (puree);Pudding-thick liquid Liquids provided via: Teaspoon Medication Administration: Crushed with puree Supervision: Staff to assist with self feeding;Full supervision/cueing for compensatory strategies Compensations: Minimize environmental distractions;Slow rate;Small sips/bites;Lingual sweep for clearance of pocketing Postural Changes and/or Swallow Maneuvers: Seated upright 90 degrees;Upright 30-60 min after meal                Oral Care Recommendations: Oral care  before and after PO Follow up Recommendations: Skilled Nursing facility SLP Visit Diagnosis: Dysphagia, oropharyngeal phase (R13.12) Plan: Continue with current plan of care       GO                Maxcine Ham 05/31/2017, 11:17 AM  Maxcine Ham, M.A. CCC-SLP [phone]

## 2017-05-31 NOTE — Plan of Care (Signed)
Problem: Education: Goal: Knowledge of disease or condition will improve Outcome: Progressing Discussed with the patient about plan of care for the evening and the need to drink pudding thick liquids with no teach back displayed.

## 2017-05-31 NOTE — Progress Notes (Signed)
Subjective:  Patient seen and examined. He was complaining of pain or discomfort with peeing this morning. When asked about this further he was unable to provide more details. Per nursing the patient was able to have a BM yesterday. He has no other complaints at this time.   Objective:  Vital signs in last 24 hours: Vitals:   05/31/17 0428 05/31/17 0500 05/31/17 0600 05/31/17 0730  BP: 105/68  123/64 (!) 146/107  Pulse: 65 66 77 (!) 112  Resp: 18 10 (!) 22 (!) 23  Temp:    98.5 F (36.9 C)  TempSrc:    Oral  SpO2: 96% 100% (!) 89% 92%  Weight:      Height:       General: Lying in bed, NAD HEENT: Moro, left infraorbital bruising, midline forehead abrasion Cardiac: RRR, No R/M/G appreciated Pulm: normal effort, CTAB on lateral lung fields Abd: soft, mild tenderness, non distended, BS decreased Ext: extremities with chronic venous stasis changes; RLE with healing wounds without drainage; LLE wounds wrapped  Neuro: alert; right sided graze preference improving, unable to track; 0/5 right upper and lower extremity strength; patient remains contracted; he still has a left facial droop and mild dysarthia  Assessment/Plan:  Principal Problem:   Acute ischemic stroke (HCC) Active Problems:   Chronic venous insufficiency   Right sided cerebral hemisphere cerebrovascular accident (CVA) (HCC)   Acute cystitis with hematuria   Controlled type 2 diabetes mellitus with hyperglycemia, without long-term current use of insulin (HCC)   Pressure injury of skin   E. coli UTI (urinary tract infection)  Acute Right, emoblic MCA CVA Multiple small embolic CVAs Patient presumed cardioembolic etiology of his strokes. Patient with improving dysphagia and aphasia. He has continued LUE spastic paralysis and RLE spasticity with weakness but has been able to intermittently move his toes. Echo shows normal EF, and G1DD; he has a calcified MV annulus. TEE has not been able to be completed due to no POA and  patient unable to give consent. Recommendation is instead for a 30 day event monitor. PT/OT recommend SNF placement. -Neurology signed off; appreciate their recommendations -Telemetry -ASA 325 mg daily, Lipitor 20 mg  -PT/OT, Speech therapy, Nutritional management following; appreciate recs -Dysphagia 1 diet  Leukocytosis Feve Afebrile over night, minimally elevated leukocytosis 10.6 this AM.  -Repeat Bcx pending.  -CBC in AM   UTI Urine culture + for ESBL resistant E. Coli. Received 1 day of ceftriaxone, 3 days of meropenem, and is now on Bactrim. Total days of abx = 8, Total days of abx with appropriate coverage = 7. Condom cath and GU exam were within normal limits.  - Will stop Bactrim 20 mL solution Q12 today    Type 2 DM A1C 5.9, well controlled. Complicated by neuropathy. CBG WNLs. -On metformin 500 mg BID at home -Insulin SS TID with meals   HTN, HLD BP stable -Holding Lisinopril 20 mg -atorva  daily   Hypothryoidsim Post surgical hypothyroidism. TSH elevated at 7.525.  -Synthroid 176 mcg QD crushed with pudding   Chronic venous insufficiency Patient with +1 lower extremity edema and bilateral venous ulcerations. 2 venous ulcerations on the LLE, small open area on the dorsal right foot. Both ulcerations on the LLE are clean, with good granulation tissue; mild warmth surrounding however improved from admission.  -Wound care recommendations relayed to nursing; will re-consult if needed   F: 50 cc/hr NS E: Repletion as needed N: Dys 1 PuddingThick  DVT prophylaxis: lovenox  Dispo: Anticipated discharge in approximately today.   Toney Rakes, MD 05/31/2017, 10:31 AM

## 2017-05-31 NOTE — Discharge Summary (Signed)
Name: Clayton Green. MRN: [id number] DOB: Oct 04, 1950 66 y.o. PCP: Smitty Cords, DO  Date of Admission: 05/23/2017  7:31 AM Date of Discharge: 06/01/2017 Attending Physician: Gust Rung, DO  Discharge Diagnosis: 1. Acute cardioembolic stroke 2. ESBL e coli UTI Principal Problem:   Acute ischemic stroke [facility]) Active Problems:   Chronic venous insufficiency   Right sided cerebral hemisphere cerebrovascular accident (CVA) (HCC)   Acute cystitis with hematuria   Controlled type 2 diabetes mellitus with hyperglycemia, without long-term current use of insulin (HCC)   Pressure injury of skin   E. coli UTI (urinary tract infection)   At high risk for aspiration   Leukocytosis  Discharge Medications: Allergies as of 06/01/2017      Reactions   Shellfish Allergy Nausea And Vomiting      Medication List    STOP taking these medications   oxyCODONE-acetaminophen 5-325 MG tablet Commonly known as:  PERCOCET/ROXICET     TAKE these medications   acetaminophen 500 MG tablet Commonly known as:  TYLENOL Take 500 mg by mouth 2 (two) times daily as needed.   aspirin 81 MG tablet Take 1 tablet (81 mg total) by mouth daily.   atorvastatin 20 MG tablet Commonly known as:  LIPITOR Take 1 tablet (20 mg total) by mouth daily.   Cholecalciferol 2000 units Caps Take 1 capsule (2,000 Units total) by mouth daily.   furosemide 20 MG tablet Commonly known as:  LASIX Take 1 tablet (20 mg total) by mouth every other day. May take extra dose or daily as needed if worsening swelling.   levothyroxine 88 MCG tablet Commonly known as:  SYNTHROID, LEVOTHROID Take 2 tablets (176 mcg total) by mouth daily before breakfast.   lisinopril 10 MG tablet Commonly known as:  PRINIVIL,ZESTRIL Take 1 tablet (10 mg total) by mouth daily.   meloxicam 15 MG tablet Commonly known as:  MOBIC Take 1 tablet (15 mg total) by mouth daily as needed for pain.   metFORMIN 500 MG  tablet Commonly known as:  GLUCOPHAGE Take 1 tablet (500 mg total) by mouth 2 (two) times daily with a meal.   mineral oil-hydrophilic petrolatum ointment Apply topically as needed for dry skin. Apply to legs daily.   ondansetron 4 MG disintegrating tablet Commonly known as:  ZOFRAN ODT Take 1 tablet (4 mg total) by mouth every 8 (eight) hours as needed for nausea or vomiting.   PARoxetine 30 MG tablet Commonly known as:  PAXIL Take 1 tablet (30 mg total) by mouth daily.   ranitidine 150 MG tablet Commonly known as:  ZANTAC Take 1 tablet (150 mg total) by mouth at bedtime.   rivaroxaban 10 MG Tabs tablet Commonly known as:  XARELTO Take 1 tablet (10 mg total) by mouth daily.   sodium hypochlorite external solution Commonly known as:  DAKIN'S 1/2 STRENGTH Irrigate with 1 application as directed 3 (three) times daily. To right upper top foot.   traZODone 100 MG tablet Commonly known as:  DESYREL Take 1 tablet (100 mg total) by mouth at bedtime. What changed:  how much to take       Disposition and follow-up:   Clayton Green. was discharged from [facility] in [place] condition.  At the hospital follow up visit please address:  1.   Embolic stroke: --Xarelto  daily --atorvastatin  daily --aspirin 81 mg daily --dysphagia 1 diet --needs continued work with PT/OT/Speech therapy   Hypothyroidism: --levothyroxine daily --  needs TSH in 6-8 weeks  LE wounds: --needs daily dressing changes   2.  Labs / imaging needed at time of follow-up:  -Needs 30 day event monitor through [facility] cardiology - will need to follow up in office -TSH in 6-8 weeks   3.  Pending labs/ test needing follow-up: Blood cultures 05/30/17  Follow-up Appointments: Follow-up Information    Nilda Riggs, NP. Schedule an appointment as soon as possible for a visit in 6 week(s).   Specialty:  Family Medicine Contact information: [address] [place] [address] [phone]        [facility] [employer]. Schedule an appointment as soon as possible for a visit.   Specialty:  Cardiology Why:  for set up for 30 day event monitor. Contact information: [address] [place] [address] [phone]          Hospital Course by problem list: Principal Problem:   Acute ischemic stroke [facility]) Active Problems:   Chronic venous insufficiency   Right sided cerebral hemisphere cerebrovascular accident (CVA) (HCC)   Acute cystitis with hematuria   Controlled type 2 diabetes mellitus with hyperglycemia, without long-term current use of insulin (HCC)   Pressure injury of skin   E. 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 His presenting symptoms were left facial droop, aphasia, right gaze preference, and spastic paralysis of his left upper and lower extremities. CT head was positive for acute/subacute infarct (6cm in size) in the R MCA territory with mild swelling and no hemorrhage. Following CT angio head/neck showed the defect in R MCA distribution consistent with the stroke, but otherwise patent blood vessels; furthermore MRI brain showed multiple other smaller acute infarcts - in conjunction with the otherwise patent vasculature, this was found to be consistent with a cardioembolic stroke. Patient was monitored on telemetry during his stay and no significant arrhythmia was found other than occasional PVCs. Echo showed EF 55-60%, G1DD, no regional wall motion abnormalities, +MV calcified annulus and mild dilation of his left atrium but no concrete embolic source. Transesophageal echocardiogram was attempted, however patient was not found to be  competent to provide consent due to his aphasia, and patient had no family or HPOAs that could be reached for medical decision making. Patient was recommended to have a 30 day event monitor instead to evaluate for silent a fib/flutter; cardiology was made aware and will work on PA; patient will need to be seen in their office for set up. Patient was evaluated by speech therapy who recommended dysphagia 1 diet due to high aspiration risk; PT/OT recommended SNF placement for further rehabilitation. At discharge, patient's aphasia was improving and he was able to communicate more (though still limited), however the remainder of his deficits remained largely unchanged. Due to high suspicion for cardioembolic stroke, he was started on Xarelto on discharge, and will continue with aspirin 81 mg daily and atorvastatin 20mg  daily. He will see neurology in 6 weeks for a follow up.  ESBL E coli UTI: On admission, patient was found to be febrile and with UA suggestive of infection; he was empirically started on IV ceftriaxone. This was changed after urine cultures grew ESBL E coli; he was  temporarily on IV meropenem due to NPO status, then was changed to bactrim for remainder of 7 day course of treatment. Patient's WBC and fever subsided quickly. Blood cultures from admission were no growth x5 days. He did have a fever 2 days prior to discharge with no signs/symptoms that would suggest treatment failure or other source of infection; this subsided without intervention. We did obtain repeat blood cultures which had no growth at time of discharge. These will need to be followed up.  HTN: Prior to admission, patient was on losartan for HTN treatment; this was held due to giving permissive hypertension in setting of acute stroke and borderline low BPs on admission. Following fluid resuscitation, patient's BP remained in the normal range so his blood pressure medicine was not restarted. Please continue to monitor his blood  pressure for indication of restarting medical management.  Hypothyroidism: Patient with post-surgical hypothyroidism, who is supposed to be on levothyroxine daily, though it is uncertain if he has been on this. TSH checked on admission was elevated to 7.5; he was restarted on his levothyroxine daily; please recheck TSH in 6-8 weeks for further titration of his synthroid dosing if necessary.  Chronic venous insufficiency LE wounds: Patient with history of chronic venous insufficiency and likely PAD; he has a long history of LE wounds associated with this which appears to have been followed by [facility] wound care. On admission, he had decreased L PT/DP pulses though doppler ultrasound of the DP revealed good waveform. He has bilateral LE wounds which were managed by wound care and nursing while admitted; they had mild surrounding erythema, however had good granulation tissue and were nondraining. There was no concern for infection with these wounds during admission.   T2DM: Patient with well controlled T2DM on metformin  BID at home; this was held during admission and he was covered by sliding scale insulin. Metformin was resumed at discharge. A1c this admission was 5.9.  Discharge Vitals:   BP (!) 143/98 (BP Location: Left Arm)   Pulse (!) 102   Temp 98.3 F (36.8 C) (Axillary)   Resp (!) 29   Ht  (1.702 m)   Wt 230 lb 12.8 oz (104.7 kg)   SpO2 98%   BMI 36.15 kg/m   Pertinent Labs, Studies, and Procedures:  CBC Latest Ref Rng & Units 06/01/2017 05/31/2017 05/30/2017  WBC 4.0 - 10.5 K/uL 9.2 10.6(H) 10.2  Hemoglobin 13.0 - 17.0 g/dL 11.7(L) 12.0(L) 11.3(L)  Hematocrit 39.0 - 52.0 % 37.3(L) 38.1(L) 36.3(L)  Platelets 150 - 400 K/uL 188 144(L) 184   BMP Latest Ref Rng & Units 06/01/2017 05/31/2017 05/30/2017  Glucose 65 - 99 mg/dL 96 95 161(W)  BUN 6 - 20 mg/dL Creatinine 0.61 - 1.24 mg/dL 9.60 4.54 0.98  BUN/Creat Ratio 6 - 22 (calc) - - -  Sodium 135 -  145 mmol/L 135 134(L) 135  Potassium 3.5 - 5.1 mmol/L 4.0 4.4 3.9  Chloride 101 - 111 mmol/L 105 104 105  CO2 22 - 32 mmol/L 20(L) 21(L) 22  Calcium 8.9 - 10.3 mg/dL 1.1(B) 8.3(L) 8.2(L)   Lipid Panel     Component Value Date/Time   CHOL 110 05/24/2017 0351   TRIG 92 05/24/2017 0351   HDL 49 05/24/2017 0351   CHOLHDL 2.2 05/24/2017 0351   VLDL 18 05/24/2017 0351   LDLCALC 43 05/24/2017 0351   TSH 05/24/17 - 7.525 Hgb A1c 05/24/17 - 5.9  Urine culture 05/23/17 - ESBL E  coli (sensitive to Imipenem, pip/tazo, bactrim) Blood culture x2 05/23/17 - NGx5 days final Blooc culturex2 05/30/17 - NGTD <24hrs MRSA screen 05/23/17 - negative  CT head wo contrast 05/23/17:  1. Acute/subacute infarction in the right middle cerebral artery territory with mild swelling but no hemorrhage. I would estimate that this is greater than 8 hours old given these findings. Region of infarction measures approximately 6 cm  CT angio head/neck w wo contrast; cerebral perfusion w contrast 05/23/17: IMPRESSION: -Missing M2 to M3 branch on the right without evidence of a retrievable proximal thrombus. This corresponds to the region of infarction demonstrated and noncontrast CT and for fusion. -No upstream vascular lesion seen to predispose to stroke. Minimal atherosclerotic changes without stenosis or irregularity. -Perfusion imaging shows complete infarction of 18 cc in the insular and posterior frontal region. Surrounding region of diminished for fusion is calculated at 66 cc, but this is probably exaggerated based on inclusion of some left ACA territory brain, which I think is probably normal based on the vascular imaging  MRI brain wo contrast 05/23/17: 1. Multifocal nonhemorrhagic infarcts involving anterior and posterior circulation bilaterally. This suggests a central embolic source. 2. The largest area of infarction involves the right MCA territory, specifically the right frontal operculum and  insular cortex. 3. Confluent area of acute nonhemorrhagic infarct involving the posterior left parietal lobe and occipital lobe. 4. Additional punctate foci of acute infarction throughout the posterior circulation bilaterally. 5. Remote encephalomalacia involving the posterior left temporal and occipital lobe.  DG knee right 05/23/17: No acute finding. Medial compartment and patellofemoral compartment Osteoarthritis  DG hip right 05/23/17: 1. No convincing fracture. 2. Advanced arthropathic changes of the right hip, stable from the prior study  ECHO 05/24/17: - Left ventricle: The cavity size was normal. Wall thickness was   normal. Systolic function was normal. The estimated ejection   fraction was in the range of 55% to 60%. Wall motion was normal;   there were no regional wall motion abnormalities. Doppler   parameters are consistent with abnormal left ventricular   relaxation (grade 1 diastolic dysfunction). - Aortic valve: There was very mild stenosis. There was trivial   regurgitation. - Mitral valve: Calcified annulus. - Left atrium: The atrium was mildly dilated. - Pulmonary arteries: Systolic pressure was moderately increased.   PA peak pressure: 55 mm Hg (S).   Discharge Instructions: Discharge Instructions    Ambulatory referral to Neurology    Complete by:  As directed    Follow up with stroke clinic Darrol Angel preferred, if not available, then consider Sylvie Farrier, [facility] or Lucia Gaskins whoever is available) at [facility] in about 6-8 weeks. Thanks.   Diet - low sodium heart healthy    Complete by:  As directed    Discharge instructions    Complete by:  As directed    Clayton Green, Clayton Green were hospitalized for a stroke. On discharge we started you on a blood thinner called Xarelto, because it is possible your stroke was from a clot in you heart. You were not able to get all the imaging of your heart required to determine this. Take Xarelto 10 mg daily.   You were also  treated for a urinary tract infection while hospitalized. You completed the appropriate antibiotic course and do not need to take antibiotics once discharged.  Continue all other medications that you were taking as an outpatient.   Increase activity slowly    Complete by:  As directed  Signed: Toney Rakes, MD 06/01/2017, 2:34 PM

## 2017-05-31 NOTE — Progress Notes (Signed)
Medicine attending: I examined this patient today together with resident physician Dr. Landis Martins and I concur with her evaluation and management plan which we discussed together. No acute change.  Dysarthric speech.  Follows commands appropriately.  Intermittent low-grade temps and borderline elevation of white count. Lungs overall clear.  Regular cardiac rhythm.  Abdomen soft, minimally tender right lower quadrant. Persistent rigid extremities with inability to bend his knees.  Advanced osteoarthritic changes. Dense left hemiplegia no change. Impression: 1.  Multifocal cerebral infarction with dominant lesion in the right MCA territory with resulting left hemiplegia. He is immobile.  High risk for thrombotic events above and beyond the fact that he likely had an embolic stroke.  I feel that he should be anticoagulated at this time. 2.  ESBL resistant E. coli UTI he completes 8 days total antibiotics today. 3.  Low-grade fever, borderline, stable, leukocytosis. He is at risk for atelectasis and aspiration.  It would be to his benefit to try to get him mobilized out of bed and in a chair.  Range of motion exercises.

## 2017-05-31 NOTE — Progress Notes (Signed)
CSW working on placement for pt- pt first choice continues to be [facility]- Facility was reviewing all day- got back to CSW this afternoon after talking to patient and they are now stating they want a friend to sign pt in because they don't believe pt is capable of signing self in- CSW attempted to call pt contact Jocelyn Lamer multiple times to discuss- unable to reach and unable to leave voicemail- called SNF to see if they have additional contact info for Raiford Noble from previous admission  CSW will continue to follow  [name], LCSW Clinical Social Worker [phone]

## 2017-06-01 DIAGNOSIS — Z91013 Allergy to seafood: Secondary | ICD-10-CM

## 2017-06-01 DIAGNOSIS — Z7984 Long term (current) use of oral hypoglycemic drugs: Secondary | ICD-10-CM

## 2017-06-01 DIAGNOSIS — I872 Venous insufficiency (chronic) (peripheral): Secondary | ICD-10-CM

## 2017-06-01 DIAGNOSIS — Z79899 Other long term (current) drug therapy: Secondary | ICD-10-CM

## 2017-06-01 DIAGNOSIS — Z1612 Extended spectrum beta lactamase (ESBL) resistance: Secondary | ICD-10-CM

## 2017-06-01 LAB — BASIC METABOLIC PANEL
Anion gap: 10 (ref 5–15)
BUN: 12 mg/dL (ref 6–20)
CO2: 20 mmol/L — ABNORMAL LOW (ref 22–32)
CREATININE: 1.14 mg/dL (ref 0.61–1.24)
Calcium: 8.6 mg/dL — ABNORMAL LOW (ref 8.9–10.3)
Chloride: 105 mmol/L (ref 101–111)
GFR calc Af Amer: >60 mL/min (ref >60)
GLUCOSE: 96 mg/dL (ref 65–99)
POTASSIUM: 4 mmol/L (ref 3.5–5.1)
Sodium: 135 mmol/L (ref 135–145)

## 2017-06-01 LAB — CBC
HCT: 37.3 % — ABNORMAL LOW (ref 39.0–52.0)
Hemoglobin: 11.7 g/dL — ABNORMAL LOW (ref 13.0–17.0)
MCH: 29.3 pg (ref 26.0–34.0)
MCHC: 31.4 g/dL (ref 30.0–36.0)
MCV: 93.3 fL (ref 78.0–100.0)
PLATELETS: 188 10*3/uL (ref 150–400)
RBC: 4 MIL/uL — AB (ref 4.22–5.81)
RDW: 18.2 % — AB (ref 11.5–15.5)
WBC: 9.2 10*3/uL (ref 4.0–10.5)

## 2017-06-01 LAB — GLUCOSE, CAPILLARY
Glucose-Capillary: 95 mg/dL (ref 65–99)
Glucose-Capillary: 88 mg/dL (ref 65–99)

## 2017-06-01 MED ORDER — RIVAROXABAN 10 MG PO TABS: 10.0000 mg | ORAL_TABLET | Freq: Every day | ORAL | 1 refills | Status: AC

## 2017-06-01 NOTE — Progress Notes (Addendum)
Patient transferred to 5C04 via bed.  Patient was alert and oriented to person and place.  He was transferred at [facility]

## 2017-06-01 NOTE — Progress Notes (Signed)
Received pt as transfer from . Pt is alert oriented to self and place. Pt breathing w/o difficulty. Pt placed on tele box #2. SR. See flowsheet for VS. Oriented pt to the nursing unit and call bell system. Bed alarm is set. I will report him off to the oncoming shift for morning assessment.

## 2017-06-01 NOTE — Care Management Note (Signed)
Case Management Note  Patient Details  Name: Clayton Green. MRN: [id number] Date of Birth: 1951/06/14  Subjective/Objective:                    Action/Plan: Pt discharging to [facility] today. No further needs per CM.   Expected Discharge Date:  06/01/17               Expected Discharge Plan:  Skilled Nursing Facility  In-House Referral:  Clinical Social Work  Discharge planning Services  CM Consult  Post Acute Care Choice:    Choice offered to:     DME Arranged:    DME Agency:     HH Arranged:    HH Agency:     Status of Service:  Completed, signed off  If discussed at [employer], dates discussed:    Additional Comments:  Kermit Balo, RN 06/01/2017, 4:45 PM

## 2017-06-01 NOTE — Progress Notes (Signed)
  Speech Language Pathology Treatment: Cognitive-Linquistic;Dysphagia  Patient Details Name: Clayton Green. MRN: [id number] DOB: 02/05/1951 Today's Date: 06/01/2017 Time: 4098-1191 SLP Time Calculation (min) (ACUTE ONLY): 16 min  Assessment / Plan / Recommendation Clinical Impression  Pt seen with am meal, reluctant to eat, but participated with min verbal instruction. Pt tolerated 4 oz of honey thick liquids with slow rate and small bolus presentation (cup/spoon) with one delayed throat clear that sounded productive. Vocal quality otherwise remained dry. Minimal diffuse oral residuals noted. Pt able to track to left and briefly sustained attention to activities in that visual field. Moderate verbal cueing needed to improve pt accuracy of Y/N communication as pt was both perseverative and often inaccurate, or automatically responsive to questions in the room not directed to him. Pt speech was outside of normal limits, but intelligible. Single word naming and repetition accurate, but attempts to repeat phrases or communicate with more complex language was inaccurate. Suspect there is some component of baseline intellectual disability with this pt though no documentation of this noted in history. Suspects pts language communication is mildly aphasic but also telegraphic and hyponasal at baseline. Will continue efforts and also upgrade diet to honey thick liquids and puree and assess for tolerance.    HPI HPI: 66 year old man with a past medical history of diabetes hypertension hyperlipidemia depression hypothyroidism venous stasis ulcers and chronic diastolic heart failure brought in by [facility] for evaluation of left-sided weakness. CCT subacute/acute infarction right MCA territory affecting insula and frontoparietal region.  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        SLP Plan  Continue with current plan of care       Recommendations  Diet recommendations: Dysphagia 1  (puree);Honey-thick liquid Liquids provided via: Teaspoon Medication Administration: Crushed with puree Supervision: Staff to assist with self feeding;Full supervision/cueing for compensatory strategies Compensations: Minimize environmental distractions;Slow rate;Small sips/bites;Lingual sweep for clearance of pocketing Postural Changes and/or Swallow Maneuvers: Seated upright 90 degrees;Upright 30-60 min after meal                Oral Care Recommendations: Oral care before and after PO Follow up Recommendations: Skilled Nursing facility SLP Visit Diagnosis: Dysphagia, oropharyngeal phase (R13.12) Plan: Continue with current plan of care       GO               [facility], [place] CCC-SLP [phone]  Claudine Mouton 06/01/2017, 9:59 AM

## 2017-06-01 NOTE — Progress Notes (Signed)
Subjective:  Patient seen and examined. He has no complaints this morning. Denies any pain or discomfort. The patient is stable for SNF placement and continues to be AOx3. Patient's expressive aphasia continues to improve.  Objective:  Vital signs in last 24 hours: Vitals:   06/01/17 0500 06/01/17 0600 06/01/17 0640 06/01/17 0900  BP:  133/73 117/65 (!) 143/98  Pulse: (!) 55 86 81 (!) 102  Resp: 18 (!) 25 (!) 29   Temp:   98.9 F (37.2 C) 98.3 F (36.8 C)  TempSrc:   Oral Axillary  SpO2: 95% 94% 95% 98%  Weight:   230 lb 12.8 oz (104.7 kg)   Height:    (1.702 m)    General: Lying in bed, NAD HEENT: [place]/AT. Still with right gaze preference but improving Cardiac: RRR, No R/M/G appreciated Pulm: normal effort, CTAB on lateral lung fields Abd: soft, mild tenderness, non distended, BS decreased Ext: extremities with chronic venous stasis changes; RLE with wounds are healed; LLE wounds wrapped  Neuro: alert; right sided graze preference improving, unable to track; 0/5 right upper and lower extremity strength; patient remains contracted; he still has a left facial droop and mild dysarthia; still with left neglect  Assessment/Plan:  Principal Problem:   Acute ischemic stroke [facility]) Active Problems:   Chronic venous insufficiency   Right sided cerebral hemisphere cerebrovascular accident (CVA) (HCC)   Acute cystitis with hematuria   Controlled type 2 diabetes mellitus with hyperglycemia, without long-term current use of insulin (HCC)   Pressure injury of skin   E. coli UTI (urinary tract infection)   At high risk for aspiration   Leukocytosis  Acute Right, emoblic MCA CVA Multiple small embolic CVAs Patient presumed cardioembolic etiology of his strokes. Patient with improving dysphagia and aphasia. He has continued LUE spastic paralysis and RLE spasticity with weakness but has been able to intermittently move his toes. Echo shows normal EF, and G1DD; he has a calcified MV  annulus. Patient was not a candidate for TEE. Recommendation is instead for a 30 day event monitor. PT/OT recommend SNF placement. Per social work note the patient's preference is [facility], but the facility has talked to the patient and are unsure if he would be capable of signing himself in and may require a friend to sign him in. When talking to the patient he is able to understand and respond to almost all questions appropriately, as well as follow simple commands. From primary team stand point he does have the capacity to sign himself into a SNF. Patient is involved with [facility] and has a contact name Francia Greaves [phone]) who prior to admission was involved in helping the patient get to doctor's appointments, grocery shopping, etc. He has been the only contact IMTS has had and he may be a could option to help sign the patient into SNF.   -Will coordinate with social work for patient placement  -Neurology signed off; appreciate their recommendations -Telemetry -ASA 325 mg daily, Lipitor 20 mg  -PT/OT, Speech therapy, Nutritional management following; appreciate recs -Dysphagia 1 diet  Leukocytosis Fever Leukocytosis resolved, patient was afebrile over night; WBC 9.2  this AM.  -Bcx NGTD -CBC in AM   Type 2 DM A1C 5.9, well controlled. Complicated by neuropathy. CBG WNLs. -On metformin 500 mg BID at home -Insulin SS TID with meals   HTN, HLD Patient has been normotensive so have been holding Lisinopril 20 mg.  -Will continue to monitor and if consistently elevated  will restart Lisinopril -Lipitor  daily   Hypothryoidsim Post surgical hypothyroidism. TSH elevated at 7.525.  -Synthroid 176 mcg QD crushed with pudding   Chronic venous insufficiency Patient with +1 lower extremity edema and bilateral venous ulcerations. 2 venous ulcerations on the LLE have been consistently wrapped and tended to. Wounds on right lower extremity have healed.  -Wound care recommendations  relayed to nursing; will re-consult if needed   F: 50 cc/hr NS E: Repletion as needed N: Dys 1 PuddingThick  DVT prophylaxis: lovenox   Dispo: Anticipated discharge 0-1 days pending SNF placement.   Toney Rakes, MD 06/01/2017, 11:33 AM

## 2017-06-01 NOTE — Progress Notes (Signed)
Patient will discharge to [facility] Anticipated discharge date: 10/16 Family notified: attempted to call pt friend Mr. Faulk multiple times- left message Transportation by [employer]- scheduled for 4pm  CSW signing off.  [name], LCSW Clinical Social Worker [phone]

## 2017-06-01 NOTE — Progress Notes (Signed)
Nutrition Follow-up  DOCUMENTATION CODES:   Obesity unspecified  INTERVENTION:   -Magic Cup TID on meal trays  -Ensure Enlive po BID, each supplement provides 350 kcal and 20 grams of protein. Ensure Enlive will need to be thickened by RN to Honey Thick consistency  -Feeding Assistance at meal time  NUTRITION DIAGNOSIS:   Increased nutrient needs related to wound healing as evidenced by estimated needs.  Continues  GOAL:   Patient will meet greater than or equal to 90% of their needs  Progressing but not met  MONITOR:   PO intake, Supplement acceptance, Diet advancement, Labs, Weight trends, Skin, I & O's  REASON FOR ASSESSMENT:   Low Braden    ASSESSMENT:   66 yo male with PMHx significant for HTN, HLD, Type 2 DM, CKD III, GERD, hypothyroidism, and MDD presenting with left sided weakness, left sided facial droop, and dysarthria.  Pt alert on visit today, expressive aphasia improving. Pt did repeat many phrases writer said as opposed to answering questions on visit today  Pt previously on Dysphagia I, Pudding Thick Diet. Diet progressed to Dysphagia I, Honey Thick today. Recorded po intake 40% of meals. Pt took bites of pudding this AM but did not eat breakfast.   Labs: reviewed Meds: MVI, NS at 50 ml/hr   Diet Order:  DIET - DYS 1 Room service appropriate? Yes; Fluid consistency: Honey Thick Diet - low sodium heart healthy  Skin:  Wound (see comment) (st 2 sacrum, bilateral leg venous stasis ulcers)  Last BM:  PTA  Height:   Ht Readings from Last 1 Encounters:  06/01/17 _0  (1.702 m)    Weight:   Wt Readings from Last 1 Encounters:  06/01/17 230 lb 12.8 oz (104.7 kg)    Ideal Body Weight:  67.3 kg  BMI:  Body mass index is 36.15 kg/m.  Estimated Nutritional Needs:   Kcal:  2000-2200  Protein:  110-125 grams  Fluid:  2.0-2.2 L  EDUCATION NEEDS:   Education needs no appropriate at this time  [place], [place], LDN [phone]  Pager  [phone] Weekend/On-Call Pager

## 2017-06-01 NOTE — Progress Notes (Signed)
Internal Medicine Attending:   I saw and examined the patient. I reviewed the resident's note and I agree with the resident's findings and plan as documented in the resident's note. Stable for discharge, appreciate SW. SLP, PT assistance.

## 2017-06-01 NOTE — Progress Notes (Signed)
Physical Therapy Treatment Patient Details Name: Clayton Green. MRN: [id number] DOB: [birth year] Today's Date: 06/01/2017    History of Present Illness This 66 y.o. male admitted with Lt sided weakness, and communication deficits.  MRI of head showed:  Nonhemorrhagic multifocal infarcts bilateral anterior and posterior calculation, including left MCA, left MCA/PCA, right MCA, bilateral cerebellum, with the largest at right MCA. 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 PMH - DM, HTN, neuropathy, severe arthritis, CKD, depression.    PT Comments    Pt resisting attempts at mobility today. Appears agitated/frustrated. Unable to tolerate ROM attempts BLE into flexion. Pt positioned supine in bed with HOB at 55 degrees. PT to continue per POC as pt tolerates.   Follow Up Recommendations  SNF     Equipment Recommendations  Other (comment) (TBA)    Recommendations for Other Services       Precautions / Restrictions Precautions Precautions: Fall    Mobility  Bed Mobility Overal bed mobility: Needs Assistance Bed Mobility: Sit to Supine;Supine to Sit Rolling: Total assist;+2 for physical assistance   Supine to sit: +2 for physical assistance;Total assist Sit to supine: Total assist;+2 for physical assistance   General bed mobility comments: Pt resisting mobility, becoming agitated with attempts at bed mobility. Sternly saying 'no.'  Transfers                 General transfer comment: unable  Ambulation/Gait             General Gait Details: nonambulatory   Stairs            Wheelchair Mobility    Modified Rankin (Stroke Patients Only)       Balance                                            Cognition Arousal/Alertness: Awake/alert Behavior During Therapy: Agitated Overall Cognitive Status: Impaired/Different from baseline                     Current Attention Level:  Sustained Memory: Decreased short-term memory;Decreased recall of precautions Following Commands: Follows one step commands consistently;Follows one step commands with increased time Safety/Judgement: Decreased awareness of deficits;Decreased awareness of safety   Problem Solving: Slow processing;Decreased initiation;Difficulty sequencing;Requires verbal cues;Requires tactile cues General Comments: Pt mildly agitated this AM, which escalates with attempts at mobility.      Exercises      General Comments        Pertinent Vitals/Pain Faces Pain Scale: Hurts little more Pain Location: BLE with ROM Pain Descriptors / Indicators: Grimacing;Guarding;Moaning Pain Intervention(s): Limited activity within patient's tolerance    Home Living                      Prior Function            PT Goals (current goals can now be found in the care plan section) Acute Rehab PT Goals Patient Stated Goal: Not stated PT Goal Formulation: With patient Time For Goal Achievement: 06/08/17 Potential to Achieve Goals: Fair Progress towards PT goals: Progressing toward goals ( slowly)    Frequency    Min 2X/week      PT Plan Current plan remains appropriate    Co-evaluation              AM-PAC PT "6 Clicks"  Daily Activity  Outcome Measure  Difficulty turning over in bed (including adjusting bedclothes, sheets and blankets)?: Unable Difficulty moving from lying on back to sitting on the side of the bed? : Unable Difficulty sitting down on and standing up from a chair with arms (e.g., wheelchair, bedside commode, etc,.)?: Unable Help needed moving to and from a bed to chair (including a wheelchair)?: Total Help needed walking in hospital room?: Total Help needed climbing 3-5 steps with a railing? : Total 6 Click Score: 6    End of Session   Activity Tolerance: Treatment limited secondary to agitation Patient left: in bed;with bed alarm set;with call bell/phone within  reach Nurse Communication: Mobility status PT Visit Diagnosis: Other abnormalities of gait and mobility (R26.89);Difficulty in walking, not elsewhere classified (R26.2);Apraxia (R48.2);Hemiplegia and hemiparesis Hemiplegia - Right/Left: Left Hemiplegia - dominant/non-dominant: Dominant Hemiplegia - caused by: Cerebral infarction     Time: 0900-0911 PT Time Calculation (min) (ACUTE ONLY): 11 min  Charges:  $Therapeutic Activity: 8-22 mins                    G Codes:       Aida Raider, PT  Office # [phone] Pager [phone]    Ilda Foil 06/01/2017, 9:25 AM

## 2017-06-01 NOTE — Clinical Social Work Placement (Signed)
   CLINICAL SOCIAL WORK PLACEMENT  NOTE  Date:  06/01/2017  Patient Details  Name: Clayton Green. MRN: [id number] Date of Birth: 02-07-1951  Clinical Social Work is seeking post-discharge placement for this patient at the Skilled  Nursing Facility level of care (*CSW will initial, date and re-position this form in  chart as items are completed):  Yes   Patient/family provided with [place] Clinical Social Work Department's list of facilities offering this level of care within the geographic area requested by the patient (or if unable, by the patient's family).  Yes   Patient/family informed of their freedom to choose among providers that offer the needed level of care, that participate in Medicare, Medicaid or managed care program needed by the patient, have an available bed and are willing to accept the patient.  Yes   Patient/family informed of 's ownership interest in [facility] and [facility], as well as of the fact that they are under no obligation to receive care at these facilities.  PASRR submitted to EDS on       PASRR number received on       Existing PASRR number confirmed on 05/26/17     FL2 transmitted to all facilities in geographic area requested by pt/family on 05/26/17     FL2 transmitted to all facilities within larger geographic area on       Patient informed that his/her managed care company has contracts with or will negotiate with certain facilities, including the following:        Yes   Patient/family informed of bed offers received.  Patient chooses bed at [facility]     Physician recommends and patient chooses bed at      Patient to be transferred to [facility] on 06/01/17.  Patient to be transferred to facility by ptar     Patient family notified on 06/01/17 of transfer.  Name of family member notified:  Mr. Lisbeth Ply     PHYSICIAN Please sign FL2     Additional Comment:     _______________________________________________ [name], LCSW 06/01/2017, 3:05 PM

## 2017-06-02 ENCOUNTER — Telehealth: Payer: Self-pay

## 2017-06-02 NOTE — Telephone Encounter (Signed)
No TOC needed, patient transported to SNF

## 2017-06-04 LAB — CULTURE, BLOOD (ROUTINE X 2)
CULTURE: NO GROWTH
Culture: NO GROWTH
SPECIAL REQUESTS: ADEQUATE
SPECIAL REQUESTS: ADEQUATE

## 2017-06-08 ENCOUNTER — Encounter: Payer: Self-pay | Admitting: Interventional Cardiology

## 2017-06-15 ENCOUNTER — Encounter: Payer: Self-pay | Admitting: Cardiology

## 2017-06-28 ENCOUNTER — Telehealth: Payer: Self-pay | Admitting: Family Medicine

## 2017-06-28 NOTE — Telephone Encounter (Signed)
Called pt to sched for AWV with Nurse Health Advisor.  C/b #  [phone] on Skype [email or web address] if you have questions ° °

## 2017-06-30 ENCOUNTER — Ambulatory Visit: Payer: Medicare Other | Admitting: Family Medicine

## 2017-07-06 ENCOUNTER — Ambulatory Visit: Payer: Medicare Other | Admitting: Family Medicine

## 2017-07-11 ENCOUNTER — Emergency Department
Admission: EM | Admit: 2017-07-11 | Discharge: 2017-07-17 | Disposition: E | Payer: Medicare Other | Attending: Emergency Medicine | Admitting: Emergency Medicine

## 2017-07-11 ENCOUNTER — Emergency Department: Payer: Medicare Other

## 2017-07-11 DIAGNOSIS — N183 Chronic kidney disease, stage 3 (moderate): Secondary | ICD-10-CM | POA: Insufficient documentation

## 2017-07-11 DIAGNOSIS — Z79899 Other long term (current) drug therapy: Secondary | ICD-10-CM | POA: Insufficient documentation

## 2017-07-11 DIAGNOSIS — J9600 Acute respiratory failure, unspecified whether with hypoxia or hypercapnia: Secondary | ICD-10-CM | POA: Diagnosis not present

## 2017-07-11 DIAGNOSIS — Z8673 Personal history of transient ischemic attack (TIA), and cerebral infarction without residual deficits: Secondary | ICD-10-CM | POA: Diagnosis not present

## 2017-07-11 DIAGNOSIS — Z7901 Long term (current) use of anticoagulants: Secondary | ICD-10-CM | POA: Insufficient documentation

## 2017-07-11 DIAGNOSIS — I129 Hypertensive chronic kidney disease with stage 1 through stage 4 chronic kidney disease, or unspecified chronic kidney disease: Secondary | ICD-10-CM | POA: Diagnosis not present

## 2017-07-11 DIAGNOSIS — I469 Cardiac arrest, cause unspecified: Secondary | ICD-10-CM | POA: Diagnosis not present

## 2017-07-11 DIAGNOSIS — F329 Major depressive disorder, single episode, unspecified: Secondary | ICD-10-CM | POA: Diagnosis not present

## 2017-07-11 DIAGNOSIS — J9601 Acute respiratory failure with hypoxia: Secondary | ICD-10-CM

## 2017-07-11 DIAGNOSIS — Z7982 Long term (current) use of aspirin: Secondary | ICD-10-CM | POA: Diagnosis not present

## 2017-07-11 DIAGNOSIS — R55 Syncope and collapse: Secondary | ICD-10-CM | POA: Diagnosis present

## 2017-07-11 DIAGNOSIS — Z7984 Long term (current) use of oral hypoglycemic drugs: Secondary | ICD-10-CM | POA: Insufficient documentation

## 2017-07-11 DIAGNOSIS — E1122 Type 2 diabetes mellitus with diabetic chronic kidney disease: Secondary | ICD-10-CM | POA: Diagnosis not present

## 2017-07-11 MED ORDER — ETOMIDATE 2 MG/ML IV SOLN
10.0000 mg | Freq: Once | INTRAVENOUS | Status: AC
  Administered 2017-07-11: 10 mg via INTRAVENOUS

## 2017-07-11 MED ORDER — SUCCINYLCHOLINE CHLORIDE 20 MG/ML IJ SOLN
100.0000 mg | Freq: Once | INTRAMUSCULAR | Status: AC
  Administered 2017-07-11: 100 mg via INTRAVENOUS

## 2017-07-11 MED ORDER — EPINEPHRINE PF 1 MG/10ML IJ SOSY
PREFILLED_SYRINGE | INTRAMUSCULAR | Status: AC | PRN
  Administered 2017-07-11 (×4): 1 via INTRAVENOUS

## 2017-07-11 MED ORDER — SODIUM BICARBONATE 8.4 % IV SOLN
INTRAVENOUS | Status: AC | PRN
  Administered 2017-07-11: 50 meq via INTRAVENOUS

## 2017-07-11 MED ORDER — SODIUM CHLORIDE 0.9 % IV SOLN
INTRAVENOUS | Status: AC | PRN
  Administered 2017-07-11: 1000 mL via INTRAVENOUS

## 2017-07-11 MED FILL — Medication: Qty: 1 | Status: AC

## 2017-07-17 NOTE — ED Notes (Signed)
IO placed in left shoulder

## 2017-07-17 NOTE — ED Notes (Signed)
Eye prep completed. I+O and ET tube removed due to pt not being an ME case per Dr. Shaune PollackLord

## 2017-07-17 NOTE — ED Provider Notes (Signed)
[facility] Emergency Department Provider Note ____________________________________________   I have reviewed the triage vital signs and the triage nursing note.  HISTORY  Chief Complaint Respiratory Distress   Historian Level 5 Caveat History Limited by unresponsive  HPI Clayton Green. is a 66 y.o. male brought in by EMS from nursing home, apparently was his normal self, able to interact, this morning, but when staff checked on him (just prior to calling 911) patient was found unresponsive, not breathing well, blue in color.  EMS reports blue and could not get o2 sat.  Patient was noted to have aspiration material in mouth, was able to be bagged and color was improving upon arrival to ER.  BP was unable to be obtained (presumed low).  IO was placed by EMS at left tibia.  Receiving 500 cc ns bolus.    Past Medical History:  Diagnosis Date  . Acquired hypothyroidism    a. s/p thyroidectomy.  . Arthritis   . Depression   . Diabetes mellitus without complication (HCC)   . GERD (gastroesophageal reflux disease)   . Hyperlipidemia   . Hypertension   . Vitamin D deficiency     Patient Active Problem List   Diagnosis Date Noted  . At high risk for aspiration   . Leukocytosis   . E. coli UTI (urinary tract infection)   . Pressure injury of skin 05/25/2017  . Acute cystitis with hematuria   . Controlled type 2 diabetes mellitus with hyperglycemia, without long-term current use of insulin (HCC)   . Acute ischemic stroke (HCC) 05/23/2017  . Right sided cerebral hemisphere cerebrovascular accident (CVA) (HCC) 05/23/2017  . CKD (chronic kidney disease), stage III (HCC) 05/20/2017  . Insomnia 05/20/2017  . Cervical stenosis of spine 05/20/2017  . Hyperlipidemia associated with type 2 diabetes mellitus (HCC) 09/11/2016  . Chronic low back pain 09/11/2016  . Degenerative joint disease (DJD) of lumbar spine 09/11/2016  . GERD (gastroesophageal reflux  disease) 08/12/2016  . Chronic venous insufficiency 08/05/2016  . Elevated serum creatinine 08/05/2016  . Biliary colic 06/21/2016  . Cholelithiasis 06/21/2016  . 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 Postoperative hypothyroidism 03/28/2015  . Vitamin D deficiency 03/28/2015  . 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 Essential hypertension 03/28/2015    Past Surgical History:  Procedure Laterality Date  . colonscopy    . THYROIDECTOMY, PARTIAL      Prior to Admission medications   Medication Sig Start Date End Date Taking? Authorizing Provider  acetaminophen (TYLENOL) 500 MG tablet Take 500 mg by mouth 2 (two) times daily as needed.    [provider]  aspirin 81 MG tablet Take 1 tablet (81 mg total) by mouth daily. 09/21/16   Karamalegos, Netta Neat, DO  atorvastatin (LIPITOR) 20 MG tablet Take 1 tablet (20 mg total) by mouth daily. 05/20/17   Karamalegos, Netta Neat, DO  Cholecalciferol 2000 units CAPS Take 1 capsule (2,000 Units total) by mouth daily. 08/24/16   Karamalegos, Netta Neat, DO  furosemide (LASIX) 20 MG tablet Take 1 tablet (20 mg total) by mouth every other day. May take extra dose or daily as needed if worsening swelling. 05/20/17   Karamalegos, Netta Neat, DO  levothyroxine (SYNTHROID, LEVOTHROID) 88 MCG tablet Take 2 tablets (176 mcg total) by mouth daily before breakfast. 09/11/16   Karamalegos, Netta Neat, DO  lisinopril (PRINIVIL,ZESTRIL) 10 MG tablet Take 1 tablet (10 mg total) by mouth  daily. 05/20/17   Karamalegos, Netta NeatAlexander J, DO  meloxicam (MOBIC) 15 MG tablet Take 1 tablet (15 mg total) by mouth daily as needed for pain. 05/20/17   Karamalegos, Netta NeatAlexander J, DO  metFORMIN (GLUCOPHAGE) 500 MG tablet Take 1 tablet (500 mg total) by mouth 2 (two) times daily with a meal. 05/20/17   Karamalegos, Netta NeatAlexander J, DO  mineral oil-hydrophilic petrolatum (AQUAPHOR) ointment Apply topically as needed for  dry skin. Apply to legs daily. 05/14/15   Janeann ForehandHawkins, James H Jr., MD  ondansetron (ZOFRAN ODT) 4 MG disintegrating tablet Take 1 tablet (4 mg total) by mouth every 8 (eight) hours as needed for nausea or vomiting. 05/20/17   Karamalegos, Netta NeatAlexander J, DO  PARoxetine (PAXIL) 30 MG tablet Take 1 tablet (30 mg total) by mouth daily. 05/20/17   Karamalegos, Netta NeatAlexander J, DO  ranitidine (ZANTAC) 150 MG tablet Take 1 tablet (150 mg total) by mouth at bedtime. 05/20/17   Karamalegos, Netta NeatAlexander J, DO  rivaroxaban (XARELTO) 10 MG TABS tablet Take 1 tablet (10 mg total) by mouth daily. 06/01/17   Lacroce, Ames CoupeSamantha J, MD  sodium hypochlorite (DAKIN'S 1/2 STRENGTH) external solution Irrigate with 1 application as directed 3 (three) times daily. To right upper top foot.    [provider]  traZODone (DESYREL) 100 MG tablet Take 1 tablet (100 mg total) by mouth at bedtime. Patient taking differently: Take 50 mg by mouth at bedtime.  05/20/17   Smitty CordsKaramalegos, Alexander J, DO    Allergies  Allergen Reactions  . Shellfish Allergy Nausea And Vomiting    Family History  Problem Relation Age of Onset  . Diabetes Mother   . Cancer Father     Social History Social History   Tobacco Use  . Smoking status: Never Smoker  . Smokeless tobacco: Never Used  Substance Use Topics  . Alcohol use: No    Alcohol/week: 0.0 oz  . Drug use: No    Review of Systems  Level 5 caveat - unresponsive ____________________________________________   PHYSICAL EXAM:  VITAL SIGNS: ED Triage Vitals  Enc Vitals Group     BP 2016-12-18 0955 (!) 140/115     Pulse --      Resp --      Temp --      Temp src --      SpO2 2016-12-18 1001 (!) 55 %     Weight 2016-12-18 0951 230 lb (104.3 kg)     Height --      Head Circumference --      Peak Flow --      Pain Score --      Pain Loc --      Pain Edu? --      Excl. in GC? --      Constitutional: Unresponsive, agonal breathing.   HEENT   Head: Normocephalic and  atraumatic.      Eyes: Pupils fixed and mid-dilated.      Ears:         Nose: No congestion   Mouth/Throat: Thick yellow/orange emesis material in mouth.   Neck: No stridor.  Old scarring ? Thyroid surgical scar? Cardiovascular/Chest: Bradycardic, regular.  No murmurs, rubs, or gallops. Respiratory: Agonal respirations, not protecting airway.   Gastrointestinal: Soft. No distention.  Genitourinary/rectal:Deferred Musculoskeletal: Bilateral lower extremities, skin signs of peripheral vascular chronic changes. Neurologic:  No verbal.  Eyes open, but fixed dilated pupils.  No response to painful stimuli.  Skin:  Skin is warm, dry and intact.  ____________________________________________  LABS (pertinent positives/negatives) I, Governor Rooksebecca Dayan Kreis, MD the attending physician have reviewed the labs noted below.  Labs Reviewed - No data to display  ____________________________________________    EKG I, Governor Rooksebecca Marzella Miracle, MD, the attending physician have personally viewed and interpreted all ECGs.  EKG #1.  107 beats from here sinus tachycardia.  Narrow transfer normal axis.  Nonspecific ST and T wave  Repeat EKG rhythm strip after time of death.  Occasional wide-complex beat. ____________________________________________  RADIOLOGY All Xrays were viewed by me.  Imaging interpreted by Radiologist, and I, Governor Rooksebecca Matina Rodier, MD the attending physician have reviewed the radiologist interpretation noted below.  None __________________________________________  PROCEDURES  Procedure(s) performed: INTUBATION Performed by: Governor Rooksebecca Juanette Urizar  Required items: required blood products, implants, devices, and special equipment available Patient identity confirmed: provided demographic data and hospital-assigned identification number Time out: Immediately prior to procedure a "time out" was called to verify the correct patient, procedure, equipment, support staff and site/side marked as  required.  Indications: respiratory failure  Intubation method: Glidescope Laryngoscopy  Preoxygenation: BVM  Sedatives: Etomidate Paralytic: Succinylcholine  Tube Size: 7.5 cuffed  Post-procedure assessment: chest rise and ETCO2 monitor.   Breath sounds: equal and absent over the epigastrium Tube secured with: ETT holder    Cardiopulmonary Resuscitation (CPR) Procedure Note Directed/Performed by: Governor Rooksebecca Danell Verno I personally directed ancillary staff and/or performed CPR in an effort to regain return of spontaneous circulation and to maintain cardiac, neuro and systemic perfusion.    Critical Care performed: CRITICAL CARE Performed by: Governor Rooksebecca Jacquline Terrill   Total critical care time: 30 minutes  Critical care time was exclusive of separately billable procedures and treating other patients.  Critical care was necessary to treat or prevent imminent or life-threatening deterioration.  Critical care was time spent personally by me on the following activities: development of treatment plan with patient and/or surrogate as well as nursing, discussions with consultants, evaluation of patient's response to treatment, examination of patient, obtaining history from patient or surrogate, ordering and performing treatments and interventions, ordering and review of laboratory studies, ordering and review of radiographic studies, pulse oximetry and re-evaluation of patient's condition.    ____________________________________________  ED COURSE / ASSESSMENT AND PLAN  Pertinent labs & imaging results that were available during my care of the patient were reviewed by me and considered in my medical decision making (see chart for details).  Patient arrived unresponsive with fixed/dilated pupils, unable to get O2 sat, agonal respirations, unable to get blood pressure, presumed low.  I quickly reviewed the patient's paperwork that came with him, no indication that patient is DNR.  His presentation was  quite concerning for possible unsurvivable medical condition, he looked extremely ill.  I reviewed quickly his emergency contact to see if there is any family or next of kin to discuss wishes given this scenario looking very concerning, but emergency contact was listed as a "friend.  "  Given no prior DNR nor next of kin, I did go ahead and proceed with medical interventions for inadequate breathing, respiratory failure.  Patient was being aided by bag valve mask assisting his own attempts at respiration, and as soon as medicines were ready, performed a glide scope laryngoscopy.  There was a significant amount of aspiration material that was suctioned before visualizing the cords and passing the ET tube.  There was good color change in visual confirmation of tube through the cords.  O2 sat became detectable somewhere between 60 and 75%.  Patient also was IV access  trouble.  Multiple peripheral attempts were addressed, however the patient did have a left IO.  500 cc bag was being bolused through that.  As I was considering workup for stroke, aspiration, sepsis, etc., patient did lose pulses and CPR was initiated with chest compressions and epinephrine.  CPR was continued with epinephrine every 3 minutes.  One time it seemed like the pulseless electrical activity might have been wide-complex and I did give her an amp of bicarb.  No return of spontaneous circulation.  Out of concern for possible hypokalemia, patient was given a second IO in the left humerus, and 500 cc were quickly infused.  Copious amounts of liquid stomach material were suctioned from his ET tube from his lungs.  O2 sat really was never above 75% even with adequate intubation.  Despite multiple rounds of CPR with good chest compressions, no return of spontaneous circulation, each rhythm check showed just barely bradycardic minimal electrical activity with no pulse.  I was very concerned that the patient's initial clinical status  when he arrived appeared that he may have had already sustained significant neurologic impairment as a result of a certain amount of time of hypoperfusion/hypoxia, and I do not think that his chance for intact neurologic survival were very high at all.  After multiple rounds of CPR, no change in clinical status, time of death was announced at 10:15 AM.  This patient was chronically ill, from a nursing home with multiple medical problems.  Unclear what may have initially caused the unresponsive event that clearly led to a significant aspiration event and continued severe hypoxia and ultimately cardio-pulmonary arrest.  Nursing home paperwork indicates Dr. Charlott Rakes as PCP.  I discussed with ME on call, [place] -after discussion, no specific concern about foul play, recommended would be declined by the state.    CONSULTATIONS: Medical Examiner on call -we discussed the presentation and the patient's past medical history, and no evidence of trauma or foul play based on the history I received.  Patient will be declined for medical examiner case.    ___________________________________________   FINAL CLINICAL IMPRESSION(S) / ED DIAGNOSES   Final diagnoses:  Acute respiratory failure with hypoxia (HCC)  Cardiac arrest [facility])      ___________________________________________  ED Discharge Orders    None            Note: This dictation was prepared with Dragon dictation. Any transcriptional errors that result from this process are unintentional    Governor Rooks, MD 07-24-2017 1119

## 2017-07-17 NOTE — ED Notes (Signed)
No pulse, CPR continued

## 2017-07-17 NOTE — ED Notes (Signed)
Talked to [place] [facility], Cammy BrochureLatoshia Moore.

## 2017-07-17 NOTE — ED Triage Notes (Addendum)
Pt came to ED via EMS from [facility]. Staff checked on pt around 0700 am, staff reports pt was cyanotic. Pt hypoxic, being bagged upon arrival. Unresponsive at this time. HR 107

## 2017-07-17 NOTE — ED Notes (Signed)
Pt came with no belongings, came dressed in hospital gown, cut and thrown away

## 2017-07-17 NOTE — ED Notes (Signed)
No pulse, CPR started

## 2017-07-17 NOTE — ED Notes (Addendum)
MD and nurses looking for access. IO in place by EMS in left leg

## 2017-07-17 DEATH — deceased

## 2017-07-28 ENCOUNTER — Ambulatory Visit: Payer: Medicare Other | Admitting: Internal Medicine

## 2019-07-14 IMAGING — CR DG CHEST 1V PORT
1 series · 1 of 1 positions shown · non-contrast
Comparison: June 21, 2016

CLINICAL DATA: Pain following fall.  Hypertension.

EXAM:
PORTABLE CHEST 1 VIEW

[chest ap]
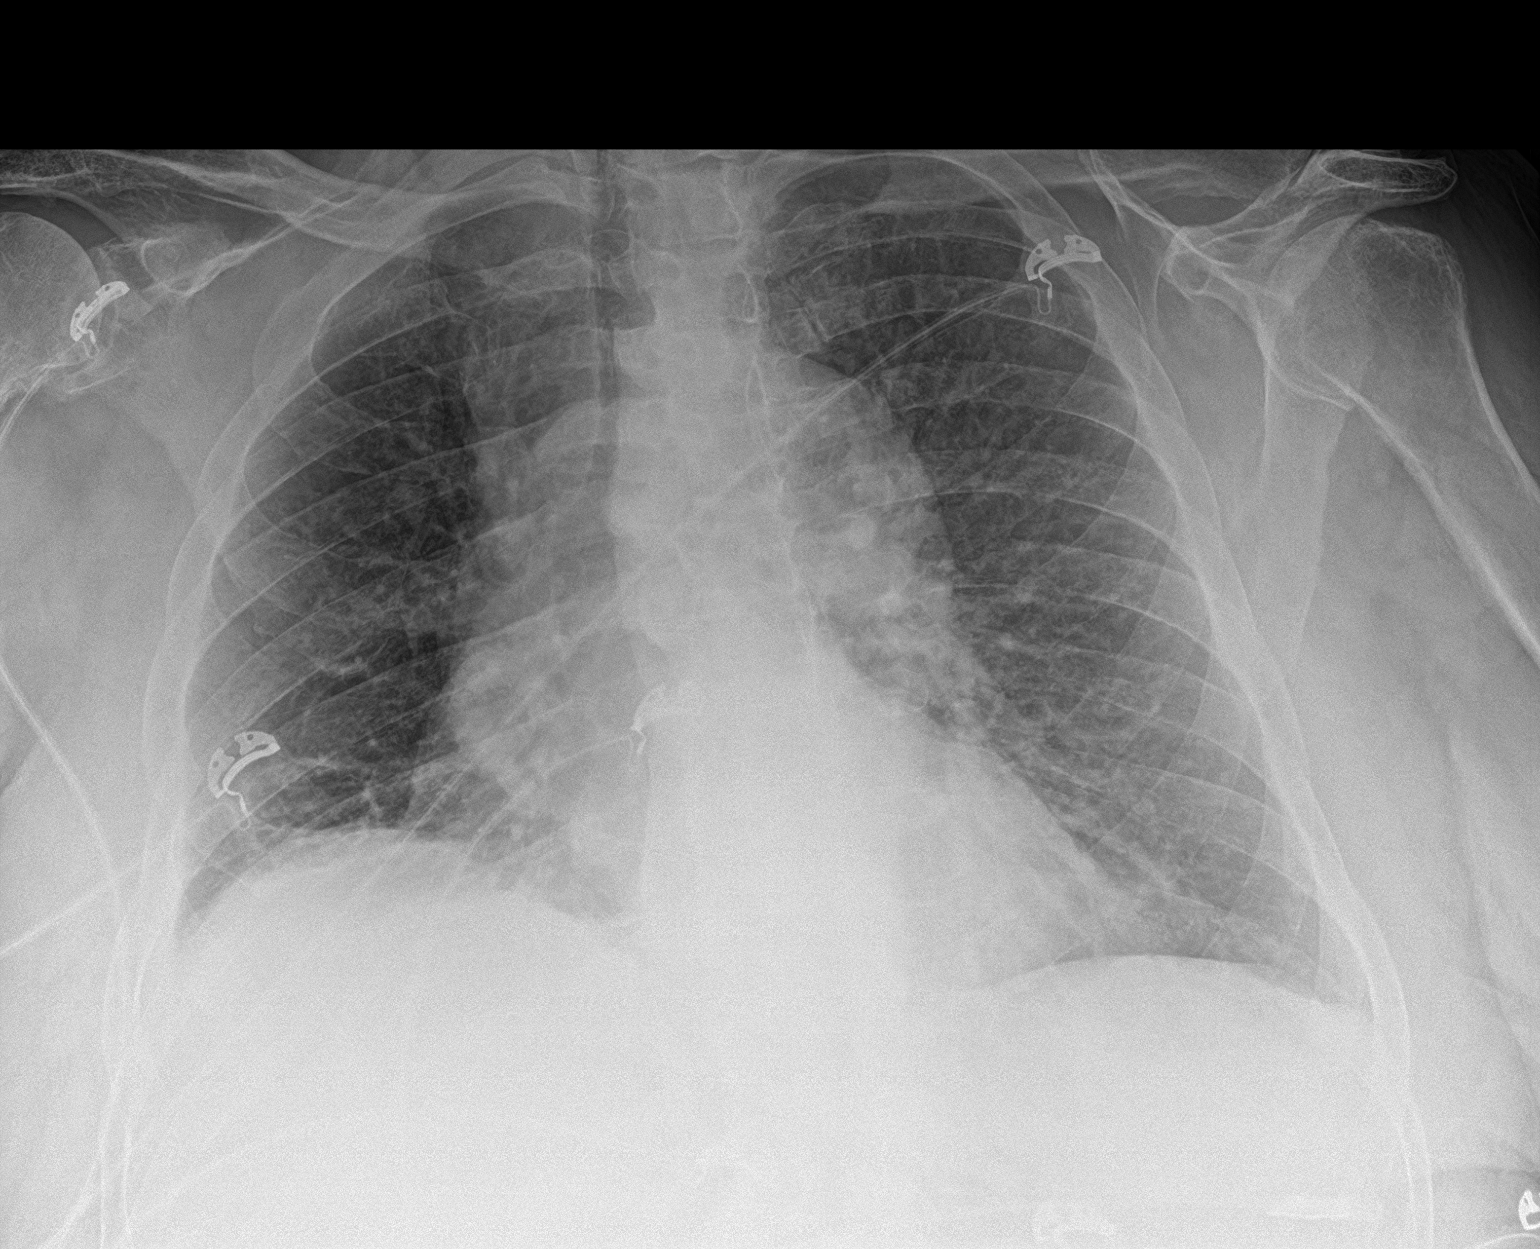

[1 of 1 positions shown; findings below may reference images not displayed]

FINDINGS: There is mild right base atelectasis. Lungs elsewhere are clear. The
heart size and pulmonary vascularity are normal. Aorta is mildly
prominent but stable. No adenopathy. Bones are osteoporotic.
IMPRESSION: Mild right base atelectasis. No edema or consolidation. Stable
cardiac silhouette. Aortic prominence likely is indicative of
chronic hypertension.

## 2019-10-05 IMAGING — CT CT HEAD CODE STROKE
3 of 7 series · 14 of 47 positions shown, 16 images · non-contrast
Comparison: 05/08/2017

CLINICAL DATA: Code stroke.  Left-sided weakness, unknown duration.

EXAM:
CT HEAD WITHOUT CONTRAST
TECHNIQUE: Contiguous axial images were obtained from the base of the skull
through the vertex without intravenous contrast.

[Series 3: head 5.0 st · axial · 0.49mm/px · z∈[-234,-94]mm · 8 of 36 slices shown, 10 images]
[im 4/36  brain]
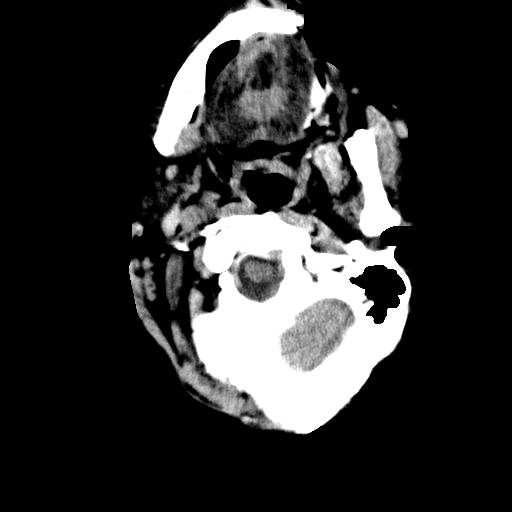
[im 4/36  bone]
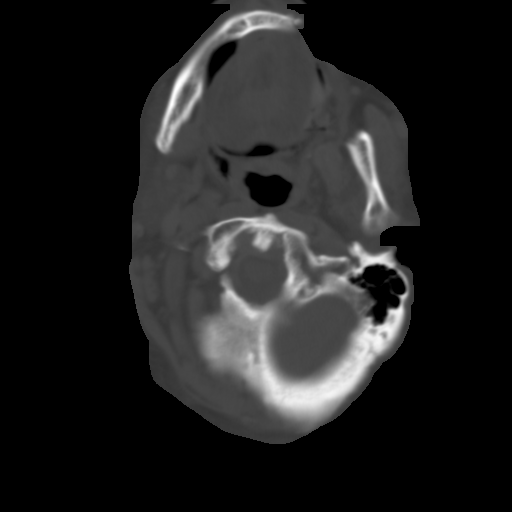
[im 7/36  brain]
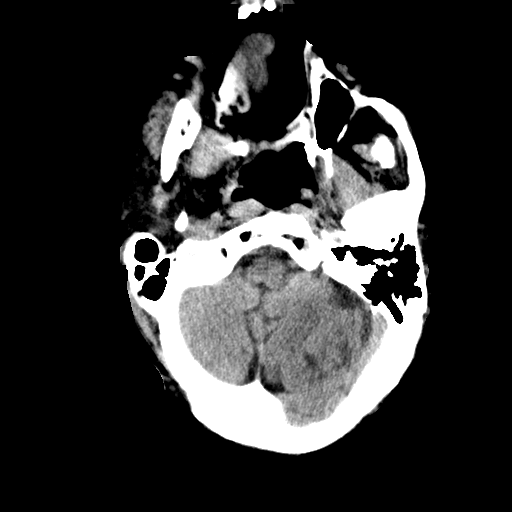
[im 13/36  brain]
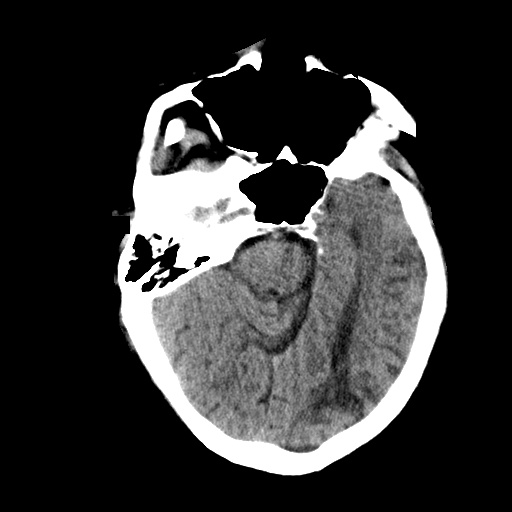
[im 16/36  brain]
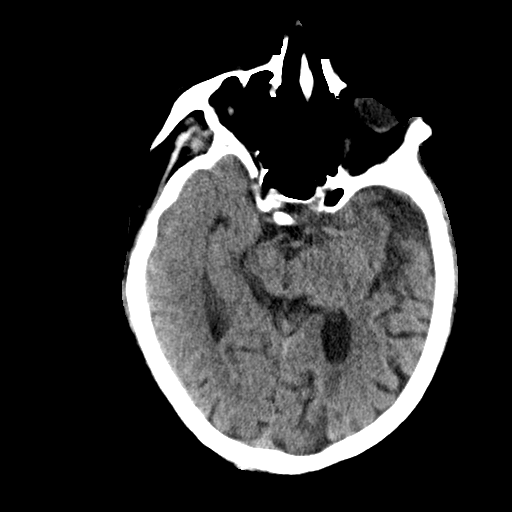
[im 20/36  brain]
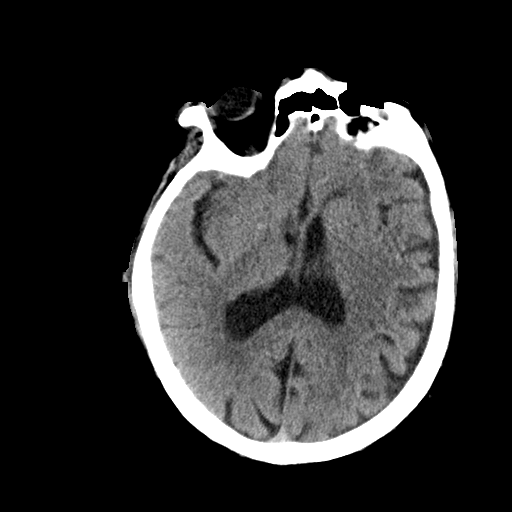
[im 20/36  bone]
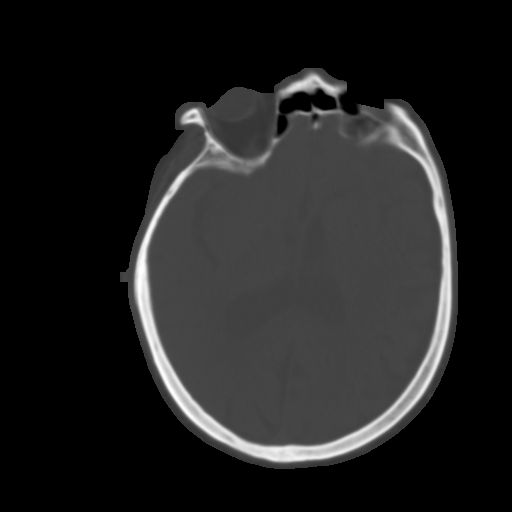
[im 23/36  brain]
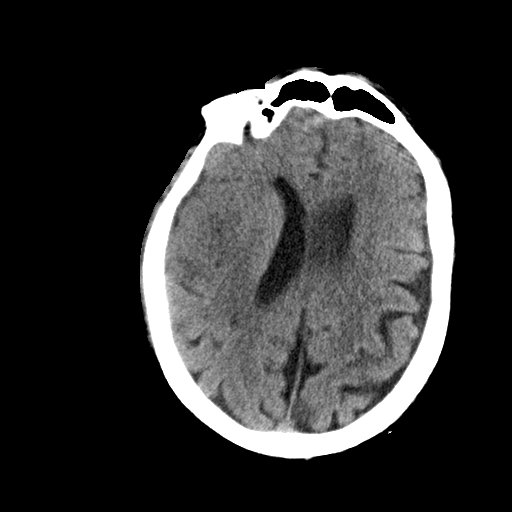
[im 29/36  brain]
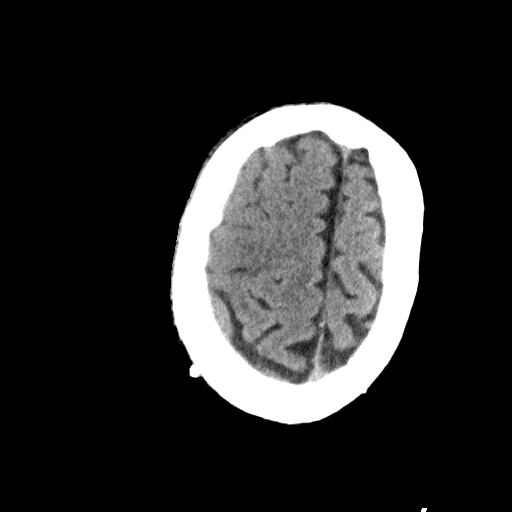
[im 32/36  brain]
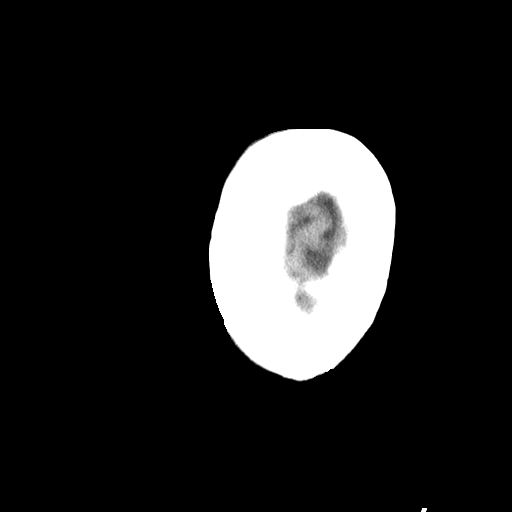

[Series 9: head 3.0 cor st · coronal · 0.36mm/px · 3 of 69 slices shown]
[im 18/69  brain]
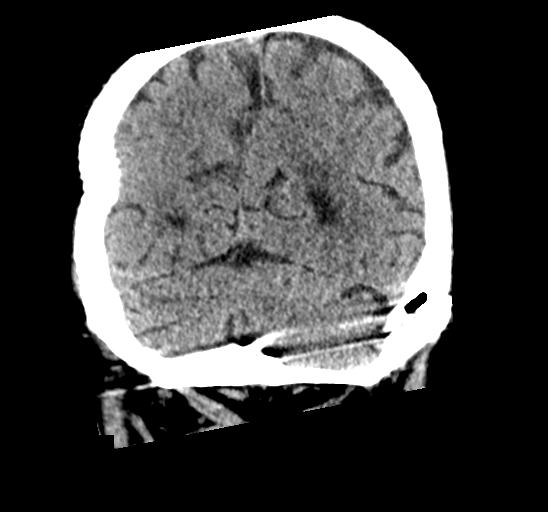
[im 35/69  brain]
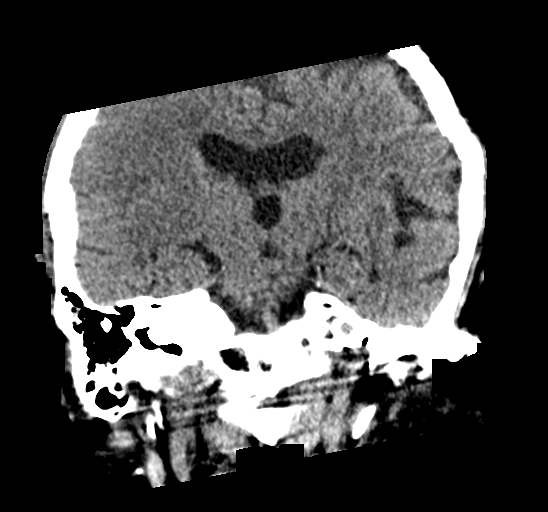
[im 52/69  brain]
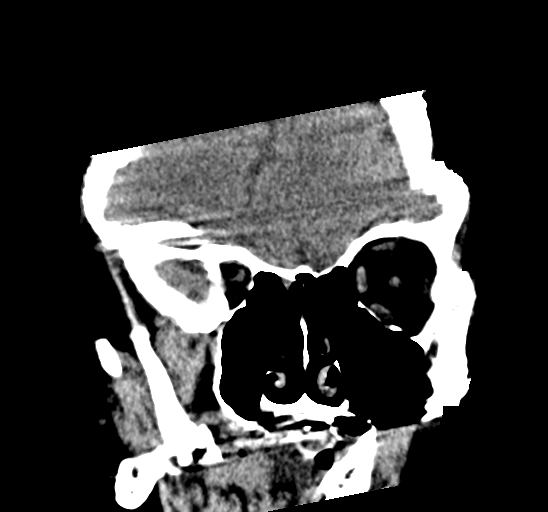

[Series 10: head 3.0 sag st · sagittal · 0.31mm/px · 3 of 55 slices shown]
[im 45/55  brain]
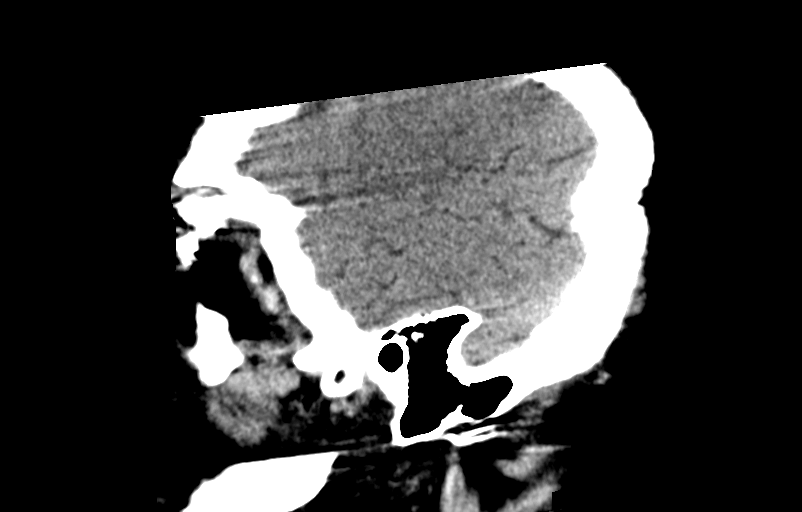
[im 47/55  brain]
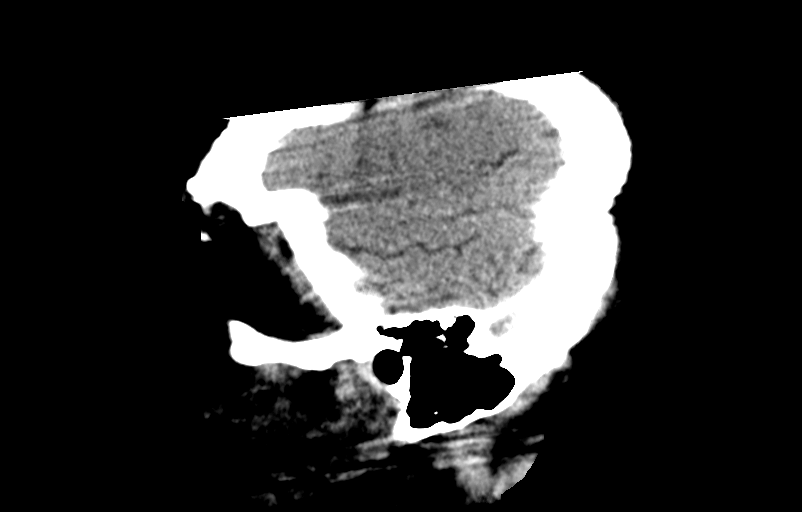
[im 50/55  brain]
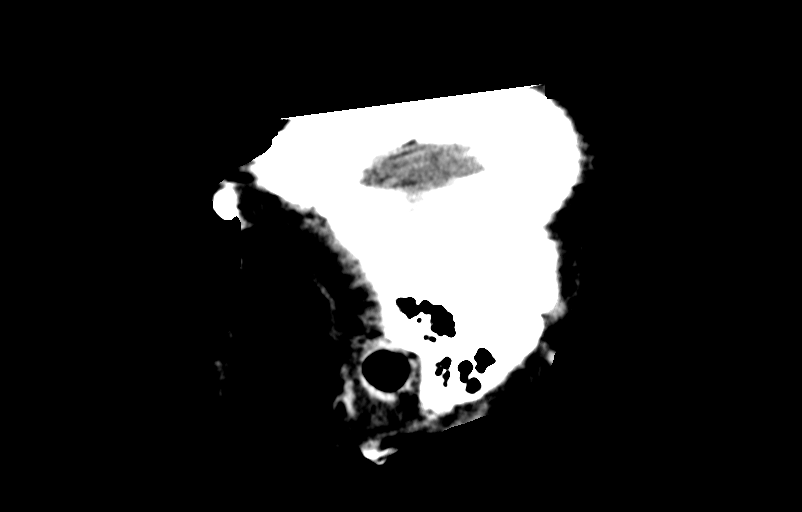

[14 of 47 positions shown; findings below may reference images not displayed]

FINDINGS: Brain: There is acute/subacute infarction in the right middle
cerebral artery territory affecting the insula and frontoparietal
region. Mild swelling but no mass effect or hemorrhage. Elsewhere,
there is old infarction in the left parieto-occipital region. No
hydrocephalus or extra-axial collection. No mass lesion.

Vascular: Question slight hyperdensity of the right MCA.

Skull: Negative

Sinuses/Orbits: Clear/normal

Other: None

ASPECTS (Alberta Stroke Program Early CT Score)

- Ganglionic level infarction (caudate, lentiform nuclei, internal
capsule, insula, M1-M3 cortex): 5

- Supraganglionic infarction (M4-M6 cortex): 1

Total score (0-10 with 10 being normal): 6
IMPRESSION: 1. Acute/subacute infarction in the right middle cerebral artery
territory with mild swelling but no hemorrhage. I would estimate
that this is greater than 8 hours old given these findings. Region
of infarction measures approximately 6 cm.
2. ASPECTS is 6

These results were called by telephone at the time of interpretation
on 05/23/2017 at [DATE] to Dr. Guebara , who verbally acknowledged
these results.
